# Patient Record
Sex: Female | Born: 1945 | Race: White | Hispanic: No | Smoking: Never smoker
Health system: Southern US, Community
[De-identification: ages and names within clinical notes are randomized; demographics above are authoritative.]

## PROBLEM LIST (undated history)

## (undated) DIAGNOSIS — E079 Disorder of thyroid, unspecified: Secondary | ICD-10-CM

## (undated) DIAGNOSIS — E119 Type 2 diabetes mellitus without complications: Secondary | ICD-10-CM

## (undated) DIAGNOSIS — K219 Gastro-esophageal reflux disease without esophagitis: Secondary | ICD-10-CM

## (undated) DIAGNOSIS — I209 Angina pectoris, unspecified: Secondary | ICD-10-CM

## (undated) DIAGNOSIS — E785 Hyperlipidemia, unspecified: Secondary | ICD-10-CM

## (undated) DIAGNOSIS — I10 Essential (primary) hypertension: Secondary | ICD-10-CM

## (undated) HISTORY — DX: Angina pectoris, unspecified: I20.9

## (undated) HISTORY — PX: CARDIAC CATHETERIZATION: SHX172

## (undated) HISTORY — DX: Hyperlipidemia, unspecified: E78.5

## (undated) HISTORY — DX: Type 2 diabetes mellitus without complications: E11.9

## (undated) HISTORY — DX: Essential (primary) hypertension: I10

## (undated) HISTORY — DX: Gastro-esophageal reflux disease without esophagitis: K21.9

## (undated) HISTORY — DX: Disorder of thyroid, unspecified: E07.9

---

## 2013-09-06 LAB — HM COLONOSCOPY

## 2015-12-31 DIAGNOSIS — E039 Hypothyroidism, unspecified: Secondary | ICD-10-CM

## 2015-12-31 HISTORY — DX: Hypothyroidism, unspecified: E03.9

## 2016-09-04 DIAGNOSIS — K219 Gastro-esophageal reflux disease without esophagitis: Secondary | ICD-10-CM

## 2016-09-04 DIAGNOSIS — I160 Hypertensive urgency: Secondary | ICD-10-CM

## 2016-09-04 DIAGNOSIS — R079 Chest pain, unspecified: Secondary | ICD-10-CM | POA: Diagnosis not present

## 2016-09-04 DIAGNOSIS — R739 Hyperglycemia, unspecified: Secondary | ICD-10-CM

## 2016-09-04 DIAGNOSIS — E039 Hypothyroidism, unspecified: Secondary | ICD-10-CM

## 2016-09-04 DIAGNOSIS — E7800 Pure hypercholesterolemia, unspecified: Secondary | ICD-10-CM

## 2016-09-04 DIAGNOSIS — E860 Dehydration: Secondary | ICD-10-CM

## 2016-09-05 DIAGNOSIS — I160 Hypertensive urgency: Secondary | ICD-10-CM | POA: Diagnosis not present

## 2016-09-05 DIAGNOSIS — E860 Dehydration: Secondary | ICD-10-CM | POA: Diagnosis not present

## 2016-09-05 DIAGNOSIS — R739 Hyperglycemia, unspecified: Secondary | ICD-10-CM | POA: Diagnosis not present

## 2016-09-05 DIAGNOSIS — R079 Chest pain, unspecified: Secondary | ICD-10-CM | POA: Diagnosis not present

## 2016-09-06 DIAGNOSIS — I160 Hypertensive urgency: Secondary | ICD-10-CM | POA: Diagnosis not present

## 2016-09-06 DIAGNOSIS — E860 Dehydration: Secondary | ICD-10-CM | POA: Diagnosis not present

## 2016-09-06 DIAGNOSIS — R079 Chest pain, unspecified: Secondary | ICD-10-CM | POA: Diagnosis not present

## 2016-09-06 DIAGNOSIS — R739 Hyperglycemia, unspecified: Secondary | ICD-10-CM | POA: Diagnosis not present

## 2016-09-07 DIAGNOSIS — I160 Hypertensive urgency: Secondary | ICD-10-CM | POA: Diagnosis not present

## 2016-09-07 DIAGNOSIS — E860 Dehydration: Secondary | ICD-10-CM | POA: Diagnosis not present

## 2016-09-07 DIAGNOSIS — R079 Chest pain, unspecified: Secondary | ICD-10-CM | POA: Diagnosis not present

## 2016-09-07 DIAGNOSIS — I214 Non-ST elevation (NSTEMI) myocardial infarction: Secondary | ICD-10-CM

## 2016-09-07 DIAGNOSIS — R739 Hyperglycemia, unspecified: Secondary | ICD-10-CM | POA: Diagnosis not present

## 2016-09-07 HISTORY — DX: Non-ST elevation (NSTEMI) myocardial infarction: I21.4

## 2016-10-07 DIAGNOSIS — E119 Type 2 diabetes mellitus without complications: Secondary | ICD-10-CM

## 2016-10-07 DIAGNOSIS — I2510 Atherosclerotic heart disease of native coronary artery without angina pectoris: Secondary | ICD-10-CM | POA: Insufficient documentation

## 2016-10-07 DIAGNOSIS — E785 Hyperlipidemia, unspecified: Secondary | ICD-10-CM

## 2016-10-07 DIAGNOSIS — I10 Essential (primary) hypertension: Secondary | ICD-10-CM | POA: Insufficient documentation

## 2016-10-07 DIAGNOSIS — Z794 Long term (current) use of insulin: Secondary | ICD-10-CM

## 2016-10-07 HISTORY — DX: Hyperlipidemia, unspecified: E78.5

## 2016-10-07 HISTORY — DX: Atherosclerotic heart disease of native coronary artery without angina pectoris: I25.10

## 2016-10-07 HISTORY — DX: Type 2 diabetes mellitus without complications: E11.9

## 2016-10-07 HISTORY — DX: Long term (current) use of insulin: Z79.4

## 2016-10-07 HISTORY — DX: Essential (primary) hypertension: I10

## 2017-03-08 DIAGNOSIS — E559 Vitamin D deficiency, unspecified: Secondary | ICD-10-CM

## 2017-03-08 DIAGNOSIS — E039 Hypothyroidism, unspecified: Secondary | ICD-10-CM | POA: Diagnosis not present

## 2017-03-08 DIAGNOSIS — M810 Age-related osteoporosis without current pathological fracture: Secondary | ICD-10-CM | POA: Diagnosis not present

## 2017-03-08 HISTORY — DX: Vitamin D deficiency, unspecified: E55.9

## 2017-03-10 DIAGNOSIS — R413 Other amnesia: Secondary | ICD-10-CM | POA: Insufficient documentation

## 2017-03-10 DIAGNOSIS — F0390 Unspecified dementia without behavioral disturbance: Secondary | ICD-10-CM

## 2017-03-10 DIAGNOSIS — F0280 Dementia in other diseases classified elsewhere without behavioral disturbance: Secondary | ICD-10-CM | POA: Insufficient documentation

## 2017-03-10 DIAGNOSIS — G309 Alzheimer's disease, unspecified: Secondary | ICD-10-CM

## 2017-03-10 DIAGNOSIS — F0281 Dementia in other diseases classified elsewhere with behavioral disturbance: Secondary | ICD-10-CM | POA: Diagnosis not present

## 2017-03-10 DIAGNOSIS — F0150 Vascular dementia without behavioral disturbance: Secondary | ICD-10-CM | POA: Diagnosis not present

## 2017-03-10 DIAGNOSIS — G301 Alzheimer's disease with late onset: Secondary | ICD-10-CM | POA: Diagnosis not present

## 2017-03-10 HISTORY — DX: Unspecified dementia, unspecified severity, without behavioral disturbance, psychotic disturbance, mood disturbance, and anxiety: F03.90

## 2017-03-10 HISTORY — DX: Other amnesia: R41.3

## 2017-03-11 DIAGNOSIS — F0281 Dementia in other diseases classified elsewhere with behavioral disturbance: Secondary | ICD-10-CM | POA: Diagnosis not present

## 2017-03-11 DIAGNOSIS — R413 Other amnesia: Secondary | ICD-10-CM | POA: Diagnosis not present

## 2017-03-11 DIAGNOSIS — G301 Alzheimer's disease with late onset: Secondary | ICD-10-CM | POA: Diagnosis not present

## 2017-03-11 DIAGNOSIS — F0150 Vascular dementia without behavioral disturbance: Secondary | ICD-10-CM | POA: Diagnosis not present

## 2017-03-14 DIAGNOSIS — E119 Type 2 diabetes mellitus without complications: Secondary | ICD-10-CM | POA: Diagnosis not present

## 2017-03-14 DIAGNOSIS — R0902 Hypoxemia: Secondary | ICD-10-CM | POA: Diagnosis not present

## 2017-03-14 DIAGNOSIS — E1165 Type 2 diabetes mellitus with hyperglycemia: Secondary | ICD-10-CM | POA: Diagnosis not present

## 2017-03-14 DIAGNOSIS — I229 Subsequent ST elevation (STEMI) myocardial infarction of unspecified site: Secondary | ICD-10-CM | POA: Diagnosis not present

## 2017-03-14 DIAGNOSIS — I25118 Atherosclerotic heart disease of native coronary artery with other forms of angina pectoris: Secondary | ICD-10-CM | POA: Diagnosis not present

## 2017-03-14 DIAGNOSIS — E039 Hypothyroidism, unspecified: Secondary | ICD-10-CM | POA: Diagnosis not present

## 2017-03-14 DIAGNOSIS — I10 Essential (primary) hypertension: Secondary | ICD-10-CM | POA: Diagnosis not present

## 2017-03-14 DIAGNOSIS — Z8249 Family history of ischemic heart disease and other diseases of the circulatory system: Secondary | ICD-10-CM | POA: Diagnosis not present

## 2017-03-14 DIAGNOSIS — I447 Left bundle-branch block, unspecified: Secondary | ICD-10-CM | POA: Diagnosis not present

## 2017-03-14 DIAGNOSIS — I252 Old myocardial infarction: Secondary | ICD-10-CM | POA: Diagnosis not present

## 2017-03-14 DIAGNOSIS — E860 Dehydration: Secondary | ICD-10-CM | POA: Diagnosis not present

## 2017-03-14 DIAGNOSIS — I5033 Acute on chronic diastolic (congestive) heart failure: Secondary | ICD-10-CM | POA: Diagnosis not present

## 2017-03-14 DIAGNOSIS — T7601XA Adult neglect or abandonment, suspected, initial encounter: Secondary | ICD-10-CM | POA: Diagnosis not present

## 2017-03-14 DIAGNOSIS — J181 Lobar pneumonia, unspecified organism: Secondary | ICD-10-CM | POA: Diagnosis not present

## 2017-03-14 DIAGNOSIS — J189 Pneumonia, unspecified organism: Secondary | ICD-10-CM | POA: Diagnosis not present

## 2017-03-14 DIAGNOSIS — E7800 Pure hypercholesterolemia, unspecified: Secondary | ICD-10-CM | POA: Diagnosis not present

## 2017-03-14 DIAGNOSIS — I214 Non-ST elevation (NSTEMI) myocardial infarction: Secondary | ICD-10-CM | POA: Diagnosis not present

## 2017-03-14 DIAGNOSIS — R079 Chest pain, unspecified: Secondary | ICD-10-CM | POA: Diagnosis not present

## 2017-03-17 DIAGNOSIS — D72829 Elevated white blood cell count, unspecified: Secondary | ICD-10-CM | POA: Diagnosis not present

## 2017-03-17 DIAGNOSIS — I119 Hypertensive heart disease without heart failure: Secondary | ICD-10-CM | POA: Diagnosis not present

## 2017-03-17 DIAGNOSIS — E119 Type 2 diabetes mellitus without complications: Secondary | ICD-10-CM | POA: Diagnosis not present

## 2017-03-17 DIAGNOSIS — Z955 Presence of coronary angioplasty implant and graft: Secondary | ICD-10-CM | POA: Diagnosis not present

## 2017-03-17 DIAGNOSIS — E039 Hypothyroidism, unspecified: Secondary | ICD-10-CM | POA: Diagnosis not present

## 2017-03-17 DIAGNOSIS — E785 Hyperlipidemia, unspecified: Secondary | ICD-10-CM | POA: Diagnosis not present

## 2017-03-17 DIAGNOSIS — I214 Non-ST elevation (NSTEMI) myocardial infarction: Secondary | ICD-10-CM | POA: Diagnosis not present

## 2017-03-17 DIAGNOSIS — Z880 Allergy status to penicillin: Secondary | ICD-10-CM | POA: Diagnosis not present

## 2017-03-17 DIAGNOSIS — I25118 Atherosclerotic heart disease of native coronary artery with other forms of angina pectoris: Secondary | ICD-10-CM | POA: Diagnosis not present

## 2017-03-17 DIAGNOSIS — E784 Other hyperlipidemia: Secondary | ICD-10-CM | POA: Diagnosis not present

## 2017-03-17 DIAGNOSIS — F0281 Dementia in other diseases classified elsewhere with behavioral disturbance: Secondary | ICD-10-CM | POA: Diagnosis not present

## 2017-03-17 DIAGNOSIS — R9431 Abnormal electrocardiogram [ECG] [EKG]: Secondary | ICD-10-CM | POA: Diagnosis not present

## 2017-03-17 DIAGNOSIS — Z794 Long term (current) use of insulin: Secondary | ICD-10-CM | POA: Diagnosis not present

## 2017-03-17 DIAGNOSIS — G301 Alzheimer's disease with late onset: Secondary | ICD-10-CM | POA: Diagnosis not present

## 2017-03-17 DIAGNOSIS — F0390 Unspecified dementia without behavioral disturbance: Secondary | ICD-10-CM | POA: Diagnosis not present

## 2017-03-17 DIAGNOSIS — Z8249 Family history of ischemic heart disease and other diseases of the circulatory system: Secondary | ICD-10-CM | POA: Diagnosis not present

## 2017-03-17 DIAGNOSIS — Z882 Allergy status to sulfonamides status: Secondary | ICD-10-CM | POA: Diagnosis not present

## 2017-03-17 DIAGNOSIS — I252 Old myocardial infarction: Secondary | ICD-10-CM | POA: Diagnosis not present

## 2017-03-17 DIAGNOSIS — I2510 Atherosclerotic heart disease of native coronary artery without angina pectoris: Secondary | ICD-10-CM | POA: Diagnosis not present

## 2017-06-02 ENCOUNTER — Encounter: Payer: Self-pay | Admitting: Cardiology

## 2017-06-02 ENCOUNTER — Ambulatory Visit (INDEPENDENT_AMBULATORY_CARE_PROVIDER_SITE_OTHER): Payer: Medicare HMO | Admitting: Cardiology

## 2017-06-02 VITALS — BP 140/60 | HR 76 | Resp 10 | Ht 61.0 in | Wt 119.4 lb

## 2017-06-02 DIAGNOSIS — I10 Essential (primary) hypertension: Secondary | ICD-10-CM

## 2017-06-02 DIAGNOSIS — I2510 Atherosclerotic heart disease of native coronary artery without angina pectoris: Secondary | ICD-10-CM

## 2017-06-02 DIAGNOSIS — E119 Type 2 diabetes mellitus without complications: Secondary | ICD-10-CM | POA: Diagnosis not present

## 2017-06-02 DIAGNOSIS — E785 Hyperlipidemia, unspecified: Secondary | ICD-10-CM | POA: Diagnosis not present

## 2017-06-02 NOTE — Progress Notes (Signed)
Cardiology Office Note:    Date:  06/02/2017   ID:  Nancy Lang, [place] 02/02/1946, MRN [id number]  PCP:  Rochel Brome, MD  Cardiologist:  Jenne Campus, MD    Referring MD: Rochel Brome, MD   Chief Complaint  Patient presents with  . Follow-up  Postoperative to follow-up  History of Present Illness:    Nancy Lang is a 71 y.o. female  with coronary artery disease. In May she started having some chest pain she undergoing to [facility], she was fine to have non-STEMI with the highest troponin I of 8 PTCA and drug-eluting stents to meet LAD was implanted. Since that time she is doing well. She is hard of hearing therefore conversation with her somewhat difficult. However, her daughter is here with her and we have regular conversation with her. Patient denies having any chest pain tightness squeezing pressure burning chest, she is very active. Actually her daughter thinks she is doing too much.  Past Medical History:  Diagnosis Date  . Angina pectoris ([place])   . Diabetes mellitus without complication ([place])   . GERD (gastroesophageal reflux disease)   . Hyperlipidemia   . Hypertension   . Thyroid disease     Past Surgical History:  Procedure Laterality Date  . CARDIAC CATHETERIZATION      Current Medications: No outpatient prescriptions have been marked as taking for the 06/02/17 encounter (Office Visit) with Park Liter, MD.     Allergies:   Fenofibrate; Oxytetracycline; Penicillins; Sulfa antibiotics; and Tetracyclines & related   Social History   Social History  . Marital status: Widowed    Spouse name: N/A  . Number of children: N/A  . Years of education: N/A   Social History Main Topics  . Smoking status: Never Smoker  . Smokeless tobacco: Current User    Types: Snuff  . Alcohol use No  . Drug use: No  . Sexual activity: Not Asked   Other Topics Concern  . None   Social History Narrative  . None      Family History: The patient's family history includes Alcohol abuse in her father; Cancer in her mother; Diabetes in her mother; Heart disease in her father and mother. ROS:   Please see the history of present illness.    All 14 point review of systems negative except as described per history of present illness  EKGs/Labs/Other Studies Reviewed:      Recent Labs: No results found for requested labs within last 8760 hours.  Recent Lipid Panel No results found for: CHOL, TRIG, HDL, CHOLHDL, VLDL, LDLCALC, LDLDIRECT  Physical Exam:    VS:  BP 140/60   Pulse 76   Resp 10   Ht 5\' 1"  (1.549 m)   Wt 119 lb 6.4 oz (54.2 kg)   BMI 22.56 kg/m     Wt Readings from Last 3 Encounters:  06/02/17 119 lb 6.4 oz (54.2 kg)     GEN:  Well nourished, well developed in no acute distress HEENT: Normal NECK: No JVD; No carotid bruits LYMPHATICS: No lymphadenopathy CARDIAC: RRR, no murmurs, no rubs, no gallops RESPIRATORY:  Clear to auscultation without rales, wheezing or rhonchi  ABDOMEN: Soft, non-tender, non-distended MUSCULOSKELETAL:  No edema; No deformity  SKIN: Warm and dry LOWER EXTREMITIES: no swelling NEUROLOGIC:  Alert and oriented x 3 PSYCHIATRIC:  Normal affect   ASSESSMENT:    1. Coronary artery disease involving native coronary artery of native heart without angina  pectoris   2. Essential hypertension   3. Dyslipidemia (high LDL; low HDL)    PLAN:    In order of problems listed above:  1. Coronary artery disease: Doing well from that point of view I review quite catheterization with her daughter as well as with the patient, we talked about what was done. I stressed importance of taking all medications especially dual antiplatelet agents. In my opinion after a year of Brelinta to will switch her to Plavix. 2. Dyslipidemia: I will ask him to have fasting lipid profile done today. 3. Diabetes mellitus: Apparently she was very point taking her medications now she is much  better. I will check her hemoglobin A1c today.  Overall she is doing well. I'll see him back in about 3 months. I review hospital record for this appointment.   Medication Adjustments/Labs and Tests Ordered: Current medicines are reviewed at length with the patient today.  Concerns regarding medicines are outlined above.  Orders Placed This Encounter  Procedures  . HgB A1c  . Lipid panel  . EKG 12-Lead   Medication changes: No orders of the defined types were placed in this encounter.   Signed, Park Liter, MD, [facility] 06/02/2017 1:21 PM    [facility]

## 2017-06-02 NOTE — Patient Instructions (Addendum)
Medication Instructions:  Your physician recommends that you continue on your current medications as directed. Please refer to the Current Medication list given to you today.  Labwork: Your physician recommends that you have your cholesterol, A1C checked today. Testing/Procedures: EKG in office today.   Follow-Up: Your physician recommends that you schedule a follow-up appointment in: 3 months   Any Other Special Instructions Will Be Listed Below (If Applicable).  Please note that any paperwork needing to be filled out by the provider will need to be addressed at the front desk prior to seeing the provider. Please note that any paperwork FMLA, Disability or other documents regarding health condition is subject to a $25.00 charge that must be received prior to completion of paperwork.     If you need a refill on your cardiac medications before your next appointment, please call your pharmacy.

## 2017-06-03 ENCOUNTER — Telehealth: Payer: Self-pay

## 2017-06-03 LAB — LIPID PANEL
CHOL/HDL RATIO: 2.5 ratio (ref 0.0–4.4)
CHOLESTEROL TOTAL: 113 mg/dL (ref 100–199)
HDL: 46 mg/dL (ref >39)
LDL Calculated: 36 mg/dL (ref 0–99)
TRIGLYCERIDES: 154 mg/dL — AB (ref 0–149)
VLDL Cholesterol Cal: 31 mg/dL (ref 5–40)

## 2017-06-03 LAB — HEMOGLOBIN A1C
Est. average glucose Bld gHb Est-mCnc: 171 mg/dL
HEMOGLOBIN A1C: 7.6 % — AB (ref 4.8–5.6)

## 2017-06-03 MED ORDER — METOPROLOL TARTRATE 50 MG PO TABS: 50.0000 mg | ORAL_TABLET | Freq: Two times a day (BID) | ORAL | 2 refills | Status: DC

## 2017-06-03 NOTE — Telephone Encounter (Signed)
Refill Request faxed from pharmacy for Metoprolol. Refills provided.

## 2017-06-22 DIAGNOSIS — H31011 Macula scars of posterior pole (postinflammatory) (post-traumatic), right eye: Secondary | ICD-10-CM | POA: Diagnosis not present

## 2017-06-22 DIAGNOSIS — Z0100 Encounter for examination of eyes and vision without abnormal findings: Secondary | ICD-10-CM | POA: Diagnosis not present

## 2017-06-22 DIAGNOSIS — E11319 Type 2 diabetes mellitus with unspecified diabetic retinopathy without macular edema: Secondary | ICD-10-CM | POA: Diagnosis not present

## 2017-06-22 DIAGNOSIS — H52223 Regular astigmatism, bilateral: Secondary | ICD-10-CM | POA: Diagnosis not present

## 2017-08-09 ENCOUNTER — Other Ambulatory Visit: Payer: Self-pay

## 2017-08-09 MED ORDER — TICAGRELOR 90 MG PO TABS: 90.0000 mg | ORAL_TABLET | Freq: Two times a day (BID) | ORAL | 6 refills | Status: DC

## 2017-08-10 ENCOUNTER — Other Ambulatory Visit: Payer: Self-pay

## 2017-08-10 MED ORDER — ATORVASTATIN CALCIUM 80 MG PO TABS: 80.0000 mg | ORAL_TABLET | Freq: Every day | ORAL | 6 refills | Status: DC

## 2017-09-02 ENCOUNTER — Encounter: Payer: Self-pay | Admitting: Cardiology

## 2017-09-02 ENCOUNTER — Ambulatory Visit: Payer: Medicare HMO | Admitting: Cardiology

## 2017-09-02 VITALS — BP 170/68 | HR 75 | Ht 62.0 in | Wt 123.1 lb

## 2017-09-02 DIAGNOSIS — E785 Hyperlipidemia, unspecified: Secondary | ICD-10-CM | POA: Diagnosis not present

## 2017-09-02 DIAGNOSIS — I2510 Atherosclerotic heart disease of native coronary artery without angina pectoris: Secondary | ICD-10-CM

## 2017-09-02 DIAGNOSIS — I10 Essential (primary) hypertension: Secondary | ICD-10-CM | POA: Diagnosis not present

## 2017-09-02 NOTE — Patient Instructions (Signed)
Medication Instructions:  Your physician recommends that you continue on your current medications as directed. Please refer to the Current Medication list given to you today.  Labwork: None   Testing/Procedures: None   Follow-Up: Your physician recommends that you schedule a follow-up appointment in: 3 months   Any Other Special Instructions Will Be Listed Below (If Applicable).  Please note that any paperwork needing to be filled out by the provider will need to be addressed at the front desk prior to seeing the provider. Please note that any paperwork FMLA, Disability or other documents regarding health condition is subject to a $25.00 charge that must be received prior to completion of paperwork in the form of a money order or check.     If you need a refill on your cardiac medications before your next appointment, please call your pharmacy.  

## 2017-09-02 NOTE — Progress Notes (Signed)
Cardiology Office Note:    Date:  09/02/2017   ID:  Nancy Lang, [place] 11/10/1945, MRN [id number]  PCP:  Nancy Brome, MD  Cardiologist:  Nancy Campus, MD    Referring MD: Nancy Brome, MD   Chief Complaint  Patient presents with  . Follow-up    Doing well Since last OV   Doing well  History of Present Illness:    Nancy Lang is a 71 y.o. female asymptomatic no chest pain tightness squeezing pressure burning chest.  Very active have no difficulty doing it.  Past Medical History:  Diagnosis Date  . Angina pectoris ([place])   . Diabetes mellitus without complication ([place])   . GERD (gastroesophageal reflux disease)   . Hyperlipidemia   . Hypertension   . Thyroid disease     Past Surgical History:  Procedure Laterality Date  . CARDIAC CATHETERIZATION      Current Medications: No outpatient medications have been marked as taking for the 09/02/17 encounter (Office Visit) with Park Liter, MD.     Allergies:   Fenofibrate; Oxytetracycline; Penicillins; Sulfa antibiotics; and Tetracyclines & related   Social History   Socioeconomic History  . Marital status: Widowed    Spouse name: None  . Number of children: None  . Years of education: None  . Highest education level: None  Social Needs  . Financial resource strain: None  . Food insecurity - worry: None  . Food insecurity - inability: None  . Transportation needs - medical: None  . Transportation needs - non-medical: None  Occupational History  . None  Tobacco Use  . Smoking status: Never Smoker  . Smokeless tobacco: Current User    Types: Snuff  Substance and Sexual Activity  . Alcohol use: No  . Drug use: No  . Sexual activity: None  Other Topics Concern  . None  Social History Narrative  . None     Family History: The patient's family history includes Alcohol abuse in her father; Cancer in her mother; Diabetes in her mother; Heart disease in her father and  mother. ROS:   Please see the history of present illness.    All 14 point review of systems negative except as described per history of present illness  EKGs/Labs/Other Studies Reviewed:      Recent Labs: No results found for requested labs within last 8760 hours.  Recent Lipid Panel    Component Value Date/Time   CHOL 113 06/02/2017 1214   TRIG 154 (H) 06/02/2017 1214   HDL 46 06/02/2017 1214   CHOLHDL 2.5 06/02/2017 1214   LDLCALC 36 06/02/2017 1214    Physical Exam:    VS:  BP (!) 170/68   Pulse 75   Ht 5\' 2"  (1.575 m)   Wt 123 lb 1.3 oz (55.8 kg)   SpO2 98%   BMI 22.51 kg/m     Wt Readings from Last 3 Encounters:  09/02/17 123 lb 1.3 oz (55.8 kg)  06/02/17 119 lb 6.4 oz (54.2 kg)     GEN:  Well nourished, well developed in no acute distress HEENT: Normal NECK: No JVD; No carotid bruits LYMPHATICS: No lymphadenopathy CARDIAC: RRR, no murmurs, no rubs, no gallops RESPIRATORY:  Clear to auscultation without rales, wheezing or rhonchi  ABDOMEN: Soft, non-tender, non-distended MUSCULOSKELETAL:  No edema; No deformity  SKIN: Warm and dry LOWER EXTREMITIES: no swelling NEUROLOGIC:  Alert and oriented x 3 PSYCHIATRIC:  Normal affect   ASSESSMENT:    1.  Essential hypertension   2. Dyslipidemia (high LDL; low HDL)   3. Coronary artery disease involving native coronary artery of native heart without angina pectoris    PLAN:    In order of problems listed above:  1. Benign hypertension: Blood pressure elevated in the office but at home its perfect we will continue present management. 2. Dyslipidemia: I will check her fasting lipid profile she is scheduled to see her primary care physician next week and I asked him to do the test 3. Coronary artery disease: Stable asymptomatic and appropriate dual antiplatelets agent.   Medication Adjustments/Labs and Tests Ordered: Current medicines are reviewed at length with the patient today.  Concerns regarding medicines  are outlined above.  No orders of the defined types were placed in this encounter.  Medication changes: No orders of the defined types were placed in this encounter.   Signed, Park Liter, MD, [facility] 09/02/2017 11:14 AM    [place]

## 2017-09-08 DIAGNOSIS — I2510 Atherosclerotic heart disease of native coronary artery without angina pectoris: Secondary | ICD-10-CM | POA: Diagnosis not present

## 2017-09-08 DIAGNOSIS — R4181 Age-related cognitive decline: Secondary | ICD-10-CM | POA: Diagnosis not present

## 2017-09-08 DIAGNOSIS — E782 Mixed hyperlipidemia: Secondary | ICD-10-CM | POA: Diagnosis not present

## 2017-09-08 DIAGNOSIS — F411 Generalized anxiety disorder: Secondary | ICD-10-CM | POA: Diagnosis not present

## 2017-09-08 DIAGNOSIS — E038 Other specified hypothyroidism: Secondary | ICD-10-CM | POA: Diagnosis not present

## 2017-09-08 DIAGNOSIS — N3001 Acute cystitis with hematuria: Secondary | ICD-10-CM | POA: Diagnosis not present

## 2017-09-08 DIAGNOSIS — I10 Essential (primary) hypertension: Secondary | ICD-10-CM | POA: Diagnosis not present

## 2017-09-08 DIAGNOSIS — E559 Vitamin D deficiency, unspecified: Secondary | ICD-10-CM | POA: Diagnosis not present

## 2017-09-08 DIAGNOSIS — K219 Gastro-esophageal reflux disease without esophagitis: Secondary | ICD-10-CM | POA: Diagnosis not present

## 2017-09-08 DIAGNOSIS — E119 Type 2 diabetes mellitus without complications: Secondary | ICD-10-CM | POA: Diagnosis not present

## 2017-10-28 DIAGNOSIS — N3000 Acute cystitis without hematuria: Secondary | ICD-10-CM | POA: Diagnosis not present

## 2017-11-12 DIAGNOSIS — R799 Abnormal finding of blood chemistry, unspecified: Secondary | ICD-10-CM | POA: Diagnosis not present

## 2017-11-30 DIAGNOSIS — R300 Dysuria: Secondary | ICD-10-CM | POA: Diagnosis not present

## 2017-12-01 DIAGNOSIS — N3000 Acute cystitis without hematuria: Secondary | ICD-10-CM | POA: Diagnosis not present

## 2017-12-08 DIAGNOSIS — M810 Age-related osteoporosis without current pathological fracture: Secondary | ICD-10-CM | POA: Diagnosis not present

## 2017-12-13 DIAGNOSIS — N3000 Acute cystitis without hematuria: Secondary | ICD-10-CM | POA: Diagnosis not present

## 2017-12-13 DIAGNOSIS — E782 Mixed hyperlipidemia: Secondary | ICD-10-CM | POA: Diagnosis not present

## 2017-12-13 DIAGNOSIS — E119 Type 2 diabetes mellitus without complications: Secondary | ICD-10-CM | POA: Diagnosis not present

## 2017-12-13 DIAGNOSIS — R4181 Age-related cognitive decline: Secondary | ICD-10-CM | POA: Diagnosis not present

## 2017-12-13 DIAGNOSIS — E038 Other specified hypothyroidism: Secondary | ICD-10-CM | POA: Diagnosis not present

## 2017-12-13 DIAGNOSIS — N3001 Acute cystitis with hematuria: Secondary | ICD-10-CM | POA: Diagnosis not present

## 2017-12-13 DIAGNOSIS — F411 Generalized anxiety disorder: Secondary | ICD-10-CM | POA: Diagnosis not present

## 2017-12-13 DIAGNOSIS — I2510 Atherosclerotic heart disease of native coronary artery without angina pectoris: Secondary | ICD-10-CM | POA: Diagnosis not present

## 2017-12-13 DIAGNOSIS — K219 Gastro-esophageal reflux disease without esophagitis: Secondary | ICD-10-CM | POA: Diagnosis not present

## 2017-12-13 DIAGNOSIS — I10 Essential (primary) hypertension: Secondary | ICD-10-CM | POA: Diagnosis not present

## 2017-12-27 DIAGNOSIS — N3000 Acute cystitis without hematuria: Secondary | ICD-10-CM | POA: Diagnosis not present

## 2018-01-10 DIAGNOSIS — J060 Acute laryngopharyngitis: Secondary | ICD-10-CM | POA: Diagnosis not present

## 2018-01-12 ENCOUNTER — Ambulatory Visit: Payer: Medicare HMO | Admitting: Cardiology

## 2018-02-16 ENCOUNTER — Encounter: Payer: Self-pay | Admitting: Cardiology

## 2018-02-16 ENCOUNTER — Ambulatory Visit: Payer: Medicare HMO | Admitting: Cardiology

## 2018-02-16 VITALS — BP 124/68 | HR 73 | Ht 62.0 in | Wt 120.8 lb

## 2018-02-16 DIAGNOSIS — E785 Hyperlipidemia, unspecified: Secondary | ICD-10-CM | POA: Diagnosis not present

## 2018-02-16 DIAGNOSIS — I2510 Atherosclerotic heart disease of native coronary artery without angina pectoris: Secondary | ICD-10-CM

## 2018-02-16 DIAGNOSIS — I10 Essential (primary) hypertension: Secondary | ICD-10-CM | POA: Diagnosis not present

## 2018-02-16 NOTE — Progress Notes (Signed)
Cardiology Office Note:    Date:  02/16/2018   ID:  Nancy Lang, [place] 08-07-1946, MRN [id number]  PCP:  Rochel Brome, MD  Cardiologist:  Jenne Campus, MD    Referring MD: Rochel Brome, MD   Chief Complaint  Patient presents with  . Follow-up  Doing well  History of Present Illness:    Nancy Lang is a 72 y.o. female with coronary artery disease status post PTCA and drug-eluting stent to mid LAD in May 2018.  Also prior history of stenting to circumflex artery.  Overall she is doing well asymptomatic no chest pain tightness squeezing pressure burning chest she is very active in the garden and have no difficulty doing it.  Past Medical History:  Diagnosis Date  . Angina pectoris ([place])   . Diabetes mellitus without complication ([place])   . GERD (gastroesophageal reflux disease)   . Hyperlipidemia   . Hypertension   . Thyroid disease     Past Surgical History:  Procedure Laterality Date  . CARDIAC CATHETERIZATION      Current Medications: Current Meds  Medication Sig  . amLODipine (NORVASC) 2.5 MG tablet Take 2.5 mg by mouth daily.  Marland Kitchen aspirin 81 MG chewable tablet Chew 1 tablet by mouth daily.  Marland Kitchen atorvastatin (LIPITOR) 80 MG tablet Take 1 tablet (80 mg total) by mouth daily.  . Cholecalciferol (VITAMIN D3) 1000 units CAPS Take 1 capsule by mouth daily.  . DOCOSAHEXAENOIC ACID PO Take 1 g by mouth 2 (two) times daily.  Marland Kitchen donepezil (ARICEPT) 5 MG tablet Take 5 mg by mouth daily.  . Insulin Glargine (LANTUS SOLOSTAR) 100 UNIT/ML Solostar Pen Inject 40 Units into the skin at bedtime.  Marland Kitchen levothyroxine (SYNTHROID, LEVOTHROID) 50 MCG tablet Take 1 tablet by mouth daily.  Marland Kitchen lisinopril (PRINIVIL,ZESTRIL) 20 MG tablet Take 20 mg by mouth daily.  Marland Kitchen LORazepam (ATIVAN) 0.5 MG tablet Take 1 tablet by mouth at bedtime as needed.  . memantine (NAMENDA) 10 MG tablet Take 10 mg by mouth 2 (two) times daily.  . metFORMIN (GLUCOPHAGE) 1000 MG tablet Take 1,000  mg by mouth daily.  . metoprolol tartrate (LOPRESSOR) 50 MG tablet Take 1 tablet (50 mg total) by mouth 2 (two) times daily.  . montelukast (SINGULAIR) 10 MG tablet Take 10 mg by mouth daily.  . Multiple Vitamin (MULTI-VITAMINS) TABS Take 1 tablet by mouth daily.  Marland Kitchen omeprazole (PRILOSEC) 20 MG capsule Take 20 mg by mouth daily.  . Potassium 99 MG TABS Take 1 tablet by mouth daily.  . ticagrelor (BRILINTA) 90 MG TABS tablet Take 1 tablet (90 mg total) by mouth 2 (two) times daily.  . vitamin B-12 (CYANOCOBALAMIN) 1000 MCG tablet Take 1 tablet by mouth daily.  . vitamin E 1000 UNIT capsule Take 1 capsule by mouth daily.  . Zoledronic Acid (RECLAST IV) Inject 5 mg into the vein. yearly     Allergies:   Fenofibrate; Oxytetracycline; Penicillins; Sulfa antibiotics; and Tetracyclines & related   Social History   Socioeconomic History  . Marital status: Widowed    Spouse name: Not on file  . Number of children: Not on file  . Years of education: Not on file  . Highest education level: Not on file  Occupational History  . Not on file  Social Needs  . Financial resource strain: Not on file  . Food insecurity:    Worry: Not on file    Inability: Not on file  . Transportation needs:  Medical: Not on file    Non-medical: Not on file  Tobacco Use  . Smoking status: Never Smoker  . Smokeless tobacco: Current User    Types: Snuff  Substance and Sexual Activity  . Alcohol use: No  . Drug use: No  . Sexual activity: Not on file  Lifestyle  . Physical activity:    Days per week: Not on file    Minutes per session: Not on file  . Stress: Not on file  Relationships  . Social connections:    Talks on phone: Not on file    Gets together: Not on file    Attends religious service: Not on file    Active member of club or organization: Not on file    Attends meetings of clubs or organizations: Not on file    Relationship status: Not on file  Other Topics Concern  . Not on file  Social  History Narrative  . Not on file     Family History: The patient's family history includes Alcohol abuse in her father; Cancer in her mother; Diabetes in her mother; Heart disease in her father and mother. ROS:   Please see the history of present illness.    All 14 point review of systems negative except as described per history of present illness  EKGs/Labs/Other Studies Reviewed:      Recent Labs: No results found for requested labs within last 8760 hours.  Recent Lipid Panel    Component Value Date/Time   CHOL 113 06/02/2017 1214   TRIG 154 (H) 06/02/2017 1214   HDL 46 06/02/2017 1214   CHOLHDL 2.5 06/02/2017 1214   LDLCALC 36 06/02/2017 1214    Physical Exam:    VS:  BP 124/68   Pulse 73   Ht 5\' 2"  (1.575 m)   Wt 120 lb 12.8 oz (54.8 kg)   SpO2 98%   BMI 22.09 kg/m     Wt Readings from Last 3 Encounters:  02/16/18 120 lb 12.8 oz (54.8 kg)  09/02/17 123 lb 1.3 oz (55.8 kg)  06/02/17 119 lb 6.4 oz (54.2 kg)     GEN:  Well nourished, well developed in no acute distress HEENT: Normal NECK: No JVD; No carotid bruits LYMPHATICS: No lymphadenopathy CARDIAC: RRR, no murmurs, no rubs, no gallops RESPIRATORY:  Clear to auscultation without rales, wheezing or rhonchi  ABDOMEN: Soft, non-tender, non-distended MUSCULOSKELETAL:  No edema; No deformity  SKIN: Warm and dry LOWER EXTREMITIES: no swelling NEUROLOGIC:  Alert and oriented x 3 PSYCHIATRIC:  Normal affect   ASSESSMENT:    1. Coronary artery disease involving native coronary artery of native heart without angina pectoris   2. Essential hypertension   3. Dyslipidemia (high LDL; low HDL)    PLAN:    In order of problems listed above:  1. Coronary artery disease status post PTCA and drug-eluting stent to mid LAD with prior stent to circumflex.  She is on dual antiplatelet therapy until May of this year.  Will make sure will adjust her risk factors as well as we can.  I will call primary care physician to  get a copy of her fasting lipid profile.  Her blood pressure appears to be at target.  She is telling me that her diabetes is well controlled.  Except sometimes she make some dietary mistakes for example few days ago it was her birthday she ate too much cake and sugar was high otherwise seems to be doing well 2. Essential hypertension: Blood  pressure well controlled. 3. Dyslipidemia: We will get a copy of her fasting lipid profile from primary care physician   Medication Adjustments/Labs and Tests Ordered: Current medicines are reviewed at length with the patient today.  Concerns regarding medicines are outlined above.  No orders of the defined types were placed in this encounter.  Medication changes: No orders of the defined types were placed in this encounter.   Signed, Park Liter, MD, [facility] 02/16/2018 9:32 AM    [place]

## 2018-02-16 NOTE — Patient Instructions (Signed)
Medication Instructions:  Your physician recommends that you continue on your current medications as directed. Please refer to the Current Medication list given to you today.  Labwork: None  Testing/Procedures: None  Follow-Up: Your physician wants you to follow-up in: 5 months. You will receive a reminder letter in the mail two months in advance. If you don't receive a letter, please call our office to schedule the follow-up appointment.  Any Other Special Instructions Will Be Listed Below (If Applicable).     If you need a refill on your cardiac medications before your next appointment, please call your pharmacy.   

## 2018-03-08 DIAGNOSIS — N3000 Acute cystitis without hematuria: Secondary | ICD-10-CM | POA: Diagnosis not present

## 2018-03-09 ENCOUNTER — Other Ambulatory Visit: Payer: Self-pay | Admitting: *Deleted

## 2018-03-09 MED ORDER — TICAGRELOR 90 MG PO TABS: 90.0000 mg | ORAL_TABLET | Freq: Two times a day (BID) | ORAL | 9 refills | Status: DC

## 2018-03-09 NOTE — Telephone Encounter (Signed)
Refill sent for Brilinta to [facility] in [place].

## 2018-03-14 ENCOUNTER — Other Ambulatory Visit: Payer: Self-pay | Admitting: *Deleted

## 2018-03-14 MED ORDER — METOPROLOL TARTRATE 50 MG PO TABS: 50.0000 mg | ORAL_TABLET | Freq: Two times a day (BID) | ORAL | 3 refills | Status: DC

## 2018-03-14 NOTE — Telephone Encounter (Signed)
Refill sent to [facility] in [place] 

## 2018-04-20 DIAGNOSIS — G301 Alzheimer's disease with late onset: Secondary | ICD-10-CM | POA: Diagnosis not present

## 2018-04-20 DIAGNOSIS — F0280 Dementia in other diseases classified elsewhere without behavioral disturbance: Secondary | ICD-10-CM | POA: Diagnosis not present

## 2018-05-05 DIAGNOSIS — G301 Alzheimer's disease with late onset: Secondary | ICD-10-CM | POA: Diagnosis not present

## 2018-05-05 DIAGNOSIS — I619 Nontraumatic intracerebral hemorrhage, unspecified: Secondary | ICD-10-CM

## 2018-05-05 DIAGNOSIS — F0280 Dementia in other diseases classified elsewhere without behavioral disturbance: Secondary | ICD-10-CM | POA: Diagnosis not present

## 2018-05-05 DIAGNOSIS — Z8679 Personal history of other diseases of the circulatory system: Secondary | ICD-10-CM | POA: Insufficient documentation

## 2018-05-05 HISTORY — DX: Nontraumatic intracerebral hemorrhage, unspecified: I61.9

## 2018-06-09 DIAGNOSIS — I619 Nontraumatic intracerebral hemorrhage, unspecified: Secondary | ICD-10-CM | POA: Diagnosis not present

## 2018-07-01 DIAGNOSIS — G301 Alzheimer's disease with late onset: Secondary | ICD-10-CM | POA: Diagnosis not present

## 2018-07-01 DIAGNOSIS — F0280 Dementia in other diseases classified elsewhere without behavioral disturbance: Secondary | ICD-10-CM | POA: Diagnosis not present

## 2018-07-01 DIAGNOSIS — I619 Nontraumatic intracerebral hemorrhage, unspecified: Secondary | ICD-10-CM | POA: Diagnosis not present

## 2018-07-13 DIAGNOSIS — I619 Nontraumatic intracerebral hemorrhage, unspecified: Secondary | ICD-10-CM | POA: Diagnosis not present

## 2018-07-14 DIAGNOSIS — I669 Occlusion and stenosis of unspecified cerebral artery: Secondary | ICD-10-CM | POA: Insufficient documentation

## 2018-07-14 HISTORY — DX: Occlusion and stenosis of unspecified cerebral artery: I66.9

## 2018-08-09 DIAGNOSIS — N3000 Acute cystitis without hematuria: Secondary | ICD-10-CM | POA: Diagnosis not present

## 2018-08-09 DIAGNOSIS — Z23 Encounter for immunization: Secondary | ICD-10-CM | POA: Diagnosis not present

## 2018-08-26 DIAGNOSIS — G301 Alzheimer's disease with late onset: Secondary | ICD-10-CM | POA: Diagnosis not present

## 2018-08-26 DIAGNOSIS — F0280 Dementia in other diseases classified elsewhere without behavioral disturbance: Secondary | ICD-10-CM | POA: Diagnosis not present

## 2018-09-19 DIAGNOSIS — E1169 Type 2 diabetes mellitus with other specified complication: Secondary | ICD-10-CM | POA: Diagnosis not present

## 2018-09-19 DIAGNOSIS — I2510 Atherosclerotic heart disease of native coronary artery without angina pectoris: Secondary | ICD-10-CM | POA: Diagnosis not present

## 2018-09-19 DIAGNOSIS — R4181 Age-related cognitive decline: Secondary | ICD-10-CM | POA: Diagnosis not present

## 2018-09-19 DIAGNOSIS — E782 Mixed hyperlipidemia: Secondary | ICD-10-CM | POA: Diagnosis not present

## 2018-09-19 DIAGNOSIS — F411 Generalized anxiety disorder: Secondary | ICD-10-CM | POA: Diagnosis not present

## 2018-09-19 DIAGNOSIS — E119 Type 2 diabetes mellitus without complications: Secondary | ICD-10-CM | POA: Diagnosis not present

## 2018-09-19 DIAGNOSIS — K219 Gastro-esophageal reflux disease without esophagitis: Secondary | ICD-10-CM | POA: Diagnosis not present

## 2018-09-19 DIAGNOSIS — I10 Essential (primary) hypertension: Secondary | ICD-10-CM | POA: Diagnosis not present

## 2018-09-19 DIAGNOSIS — E038 Other specified hypothyroidism: Secondary | ICD-10-CM | POA: Diagnosis not present

## 2018-12-01 DIAGNOSIS — J060 Acute laryngopharyngitis: Secondary | ICD-10-CM | POA: Diagnosis not present

## 2018-12-01 DIAGNOSIS — N3000 Acute cystitis without hematuria: Secondary | ICD-10-CM | POA: Diagnosis not present

## 2018-12-06 DIAGNOSIS — R0789 Other chest pain: Secondary | ICD-10-CM | POA: Diagnosis not present

## 2018-12-06 DIAGNOSIS — R071 Chest pain on breathing: Secondary | ICD-10-CM | POA: Diagnosis not present

## 2018-12-06 DIAGNOSIS — M542 Cervicalgia: Secondary | ICD-10-CM | POA: Diagnosis not present

## 2018-12-06 DIAGNOSIS — J9811 Atelectasis: Secondary | ICD-10-CM | POA: Diagnosis not present

## 2018-12-08 DIAGNOSIS — T481X5A Adverse effect of skeletal muscle relaxants [neuromuscular blocking agents], initial encounter: Secondary | ICD-10-CM | POA: Diagnosis not present

## 2018-12-08 DIAGNOSIS — M542 Cervicalgia: Secondary | ICD-10-CM | POA: Diagnosis not present

## 2018-12-08 DIAGNOSIS — L500 Allergic urticaria: Secondary | ICD-10-CM | POA: Diagnosis not present

## 2018-12-08 DIAGNOSIS — T368X5A Adverse effect of other systemic antibiotics, initial encounter: Secondary | ICD-10-CM | POA: Diagnosis not present

## 2018-12-08 DIAGNOSIS — R0602 Shortness of breath: Secondary | ICD-10-CM | POA: Diagnosis not present

## 2018-12-08 DIAGNOSIS — R0789 Other chest pain: Secondary | ICD-10-CM | POA: Diagnosis not present

## 2018-12-09 DIAGNOSIS — R0602 Shortness of breath: Secondary | ICD-10-CM | POA: Diagnosis not present

## 2019-01-03 DIAGNOSIS — E559 Vitamin D deficiency, unspecified: Secondary | ICD-10-CM | POA: Diagnosis not present

## 2019-01-03 DIAGNOSIS — K219 Gastro-esophageal reflux disease without esophagitis: Secondary | ICD-10-CM | POA: Diagnosis not present

## 2019-01-03 DIAGNOSIS — I10 Essential (primary) hypertension: Secondary | ICD-10-CM | POA: Diagnosis not present

## 2019-01-03 DIAGNOSIS — E038 Other specified hypothyroidism: Secondary | ICD-10-CM | POA: Diagnosis not present

## 2019-01-03 DIAGNOSIS — E119 Type 2 diabetes mellitus without complications: Secondary | ICD-10-CM | POA: Diagnosis not present

## 2019-01-03 DIAGNOSIS — F411 Generalized anxiety disorder: Secondary | ICD-10-CM | POA: Diagnosis not present

## 2019-01-03 DIAGNOSIS — Z6823 Body mass index (BMI) 23.0-23.9, adult: Secondary | ICD-10-CM | POA: Diagnosis not present

## 2019-01-03 DIAGNOSIS — I2510 Atherosclerotic heart disease of native coronary artery without angina pectoris: Secondary | ICD-10-CM | POA: Diagnosis not present

## 2019-01-03 DIAGNOSIS — E1169 Type 2 diabetes mellitus with other specified complication: Secondary | ICD-10-CM | POA: Diagnosis not present

## 2019-01-03 DIAGNOSIS — E782 Mixed hyperlipidemia: Secondary | ICD-10-CM | POA: Diagnosis not present

## 2019-01-03 DIAGNOSIS — R4181 Age-related cognitive decline: Secondary | ICD-10-CM | POA: Diagnosis not present

## 2019-02-05 ENCOUNTER — Other Ambulatory Visit: Payer: Self-pay | Admitting: Cardiology

## 2019-02-16 DIAGNOSIS — N3000 Acute cystitis without hematuria: Secondary | ICD-10-CM | POA: Diagnosis not present

## 2019-02-24 DIAGNOSIS — G301 Alzheimer's disease with late onset: Secondary | ICD-10-CM | POA: Diagnosis not present

## 2019-02-24 DIAGNOSIS — F0280 Dementia in other diseases classified elsewhere without behavioral disturbance: Secondary | ICD-10-CM | POA: Diagnosis not present

## 2019-02-24 DIAGNOSIS — I669 Occlusion and stenosis of unspecified cerebral artery: Secondary | ICD-10-CM | POA: Diagnosis not present

## 2019-04-04 DIAGNOSIS — N3000 Acute cystitis without hematuria: Secondary | ICD-10-CM | POA: Diagnosis not present

## 2019-04-04 DIAGNOSIS — I10 Essential (primary) hypertension: Secondary | ICD-10-CM | POA: Diagnosis not present

## 2019-04-05 ENCOUNTER — Other Ambulatory Visit: Payer: Self-pay | Admitting: Cardiology

## 2019-04-29 DIAGNOSIS — N3091 Cystitis, unspecified with hematuria: Secondary | ICD-10-CM | POA: Diagnosis not present

## 2019-04-29 DIAGNOSIS — N3000 Acute cystitis without hematuria: Secondary | ICD-10-CM | POA: Diagnosis not present

## 2019-05-03 ENCOUNTER — Other Ambulatory Visit: Payer: Self-pay | Admitting: Cardiology

## 2019-05-04 ENCOUNTER — Other Ambulatory Visit: Payer: Self-pay | Admitting: Cardiology

## 2019-05-04 NOTE — Telephone Encounter (Signed)
Metoprolol refill sent to [facility] [phone]

## 2019-05-05 ENCOUNTER — Other Ambulatory Visit: Payer: Self-pay | Admitting: *Deleted

## 2019-05-09 DIAGNOSIS — R0602 Shortness of breath: Secondary | ICD-10-CM | POA: Diagnosis not present

## 2019-05-09 DIAGNOSIS — N390 Urinary tract infection, site not specified: Secondary | ICD-10-CM | POA: Diagnosis not present

## 2019-05-09 DIAGNOSIS — J180 Bronchopneumonia, unspecified organism: Secondary | ICD-10-CM | POA: Diagnosis not present

## 2019-05-09 DIAGNOSIS — R509 Fever, unspecified: Secondary | ICD-10-CM | POA: Diagnosis not present

## 2019-05-09 DIAGNOSIS — R05 Cough: Secondary | ICD-10-CM | POA: Diagnosis not present

## 2019-05-09 DIAGNOSIS — Z1159 Encounter for screening for other viral diseases: Secondary | ICD-10-CM | POA: Diagnosis not present

## 2019-05-17 DIAGNOSIS — I2510 Atherosclerotic heart disease of native coronary artery without angina pectoris: Secondary | ICD-10-CM | POA: Diagnosis not present

## 2019-05-17 DIAGNOSIS — E1169 Type 2 diabetes mellitus with other specified complication: Secondary | ICD-10-CM | POA: Diagnosis not present

## 2019-05-17 DIAGNOSIS — N3000 Acute cystitis without hematuria: Secondary | ICD-10-CM | POA: Diagnosis not present

## 2019-05-17 DIAGNOSIS — E559 Vitamin D deficiency, unspecified: Secondary | ICD-10-CM | POA: Diagnosis not present

## 2019-05-17 DIAGNOSIS — I10 Essential (primary) hypertension: Secondary | ICD-10-CM | POA: Diagnosis not present

## 2019-05-17 DIAGNOSIS — K219 Gastro-esophageal reflux disease without esophagitis: Secondary | ICD-10-CM | POA: Diagnosis not present

## 2019-05-17 DIAGNOSIS — R4181 Age-related cognitive decline: Secondary | ICD-10-CM | POA: Diagnosis not present

## 2019-05-17 DIAGNOSIS — Z6823 Body mass index (BMI) 23.0-23.9, adult: Secondary | ICD-10-CM | POA: Diagnosis not present

## 2019-05-17 DIAGNOSIS — F411 Generalized anxiety disorder: Secondary | ICD-10-CM | POA: Diagnosis not present

## 2019-05-17 DIAGNOSIS — E1121 Type 2 diabetes mellitus with diabetic nephropathy: Secondary | ICD-10-CM | POA: Diagnosis not present

## 2019-05-17 DIAGNOSIS — E782 Mixed hyperlipidemia: Secondary | ICD-10-CM | POA: Diagnosis not present

## 2019-05-17 DIAGNOSIS — E038 Other specified hypothyroidism: Secondary | ICD-10-CM | POA: Diagnosis not present

## 2019-05-18 DIAGNOSIS — R799 Abnormal finding of blood chemistry, unspecified: Secondary | ICD-10-CM | POA: Diagnosis not present

## 2019-05-25 DIAGNOSIS — R799 Abnormal finding of blood chemistry, unspecified: Secondary | ICD-10-CM | POA: Diagnosis not present

## 2019-06-03 ENCOUNTER — Other Ambulatory Visit: Payer: Self-pay | Admitting: Cardiology

## 2019-06-15 DIAGNOSIS — N3000 Acute cystitis without hematuria: Secondary | ICD-10-CM | POA: Diagnosis not present

## 2019-06-26 ENCOUNTER — Other Ambulatory Visit: Payer: Self-pay | Admitting: Cardiology

## 2019-06-29 DIAGNOSIS — N3000 Acute cystitis without hematuria: Secondary | ICD-10-CM | POA: Diagnosis not present

## 2019-06-29 DIAGNOSIS — E1121 Type 2 diabetes mellitus with diabetic nephropathy: Secondary | ICD-10-CM | POA: Diagnosis not present

## 2019-07-27 DIAGNOSIS — N3001 Acute cystitis with hematuria: Secondary | ICD-10-CM | POA: Diagnosis not present

## 2019-08-01 DIAGNOSIS — E1121 Type 2 diabetes mellitus with diabetic nephropathy: Secondary | ICD-10-CM | POA: Diagnosis not present

## 2019-08-01 DIAGNOSIS — N3000 Acute cystitis without hematuria: Secondary | ICD-10-CM | POA: Diagnosis not present

## 2019-08-01 DIAGNOSIS — Z23 Encounter for immunization: Secondary | ICD-10-CM | POA: Diagnosis not present

## 2019-08-01 DIAGNOSIS — Z1231 Encounter for screening mammogram for malignant neoplasm of breast: Secondary | ICD-10-CM | POA: Diagnosis not present

## 2019-08-01 LAB — HM MAMMOGRAPHY: HM Mammogram: NORMAL (ref 0–4)

## 2019-08-23 ENCOUNTER — Ambulatory Visit: Payer: Medicare HMO | Admitting: Cardiology

## 2019-08-25 ENCOUNTER — Encounter: Payer: Self-pay | Admitting: Cardiology

## 2019-08-25 ENCOUNTER — Telehealth (INDEPENDENT_AMBULATORY_CARE_PROVIDER_SITE_OTHER): Payer: Medicare HMO | Admitting: Cardiology

## 2019-08-25 ENCOUNTER — Other Ambulatory Visit: Payer: Self-pay

## 2019-08-25 VITALS — BP 150/62 | HR 70 | Ht 62.0 in | Wt 117.6 lb

## 2019-08-25 DIAGNOSIS — E785 Hyperlipidemia, unspecified: Secondary | ICD-10-CM

## 2019-08-25 DIAGNOSIS — I2510 Atherosclerotic heart disease of native coronary artery without angina pectoris: Secondary | ICD-10-CM

## 2019-08-25 DIAGNOSIS — G301 Alzheimer's disease with late onset: Secondary | ICD-10-CM | POA: Diagnosis not present

## 2019-08-25 DIAGNOSIS — I10 Essential (primary) hypertension: Secondary | ICD-10-CM

## 2019-08-25 DIAGNOSIS — F0280 Dementia in other diseases classified elsewhere without behavioral disturbance: Secondary | ICD-10-CM

## 2019-08-25 NOTE — Progress Notes (Signed)
Virtual Visit via Video Note   This visit type was conducted due to national recommendations for restrictions regarding the COVID-19 Pandemic (e.g. social distancing) in an effort to limit this patient's exposure and mitigate transmission in our community.  Due to her co-morbid illnesses, this patient is at least at moderate risk for complications without adequate follow up.  This format is felt to be most appropriate for this patient at this time.  All issues noted in this document were discussed and addressed.  A limited physical exam was performed with this format.  Please refer to the patient's chart for her consent to telehealth for [facility].  Evaluation Performed:  Follow-up visit  This visit type was conducted due to national recommendations for restrictions regarding the COVID-19 Pandemic (e.g. social distancing).  This format is felt to be most appropriate for this patient at this time.  All issues noted in this document were discussed and addressed.  No physical exam was performed (except for noted visual exam findings with Video Visits).  Please refer to the patient's chart (MyChart message for video visits and phone note for telephone visits) for the patient's consent to telehealth for [facility].  Date:  08/25/2019  ID: Nancy Lang, [place] Oct 09, 1946, MRN [id number]   Patient Location: [place] [address]   Provider location:   [place] Office  PCP:  Rochel Brome, MD  Cardiologist:  Jenne Campus, MD     Chief Complaint: Doing well  History of Present Illness:    Nancy Lang is a 73 y.o. female  who presents via audio/video conferencing for a telehealth visit today.  Past medical history significant for coronary artery disease PTCA and drug-eluting stent to mid LAD in May 2018 also prior history of stenting to circumflex artery leading problem appears to be dementia likely seems to be stable  she comes to our office with her daughter.  I talked to them using video link.  Overall she said and her daughter confirmed that that she is doing well.  Denies having any chest pain, tightness, pressure, burning in the chest she is able to walk and do things around her house however dementia is obviously a problem she is wondering during the night she sleeps during the day.   The patient does not have symptoms concerning for COVID-19 infection (fever, chills, cough, or new SHORTNESS OF BREATH).    Prior CV studies:   The following studies were reviewed today:       Past Medical History:  Diagnosis Date  . Angina pectoris ([employer])   . Diabetes mellitus without complication ([place])   . GERD (gastroesophageal reflux disease)   . Hyperlipidemia   . Hypertension   . Thyroid disease     Past Surgical History:  Procedure Laterality Date  . CARDIAC CATHETERIZATION       Current Meds  Medication Sig  . aspirin 81 MG chewable tablet Chew 1 tablet by mouth daily.  Marland Kitchen atorvastatin (LIPITOR) 80 MG tablet TAKE 1 TABLET BY MOUTH DAILY  . Cholecalciferol (VITAMIN D3) 1000 units CAPS Take 1 capsule by mouth daily.  . DOCOSAHEXAENOIC ACID PO Take 1 g by mouth 2 (two) times daily.  Marland Kitchen donepezil (ARICEPT) 5 MG tablet Take 5 mg by mouth daily.  . Insulin Glargine (LANTUS SOLOSTAR) 100 UNIT/ML Solostar Pen Inject 40 Units into the skin at bedtime.  Marland Kitchen levothyroxine (SYNTHROID, LEVOTHROID) 50 MCG tablet Take 1 tablet by mouth  daily.  . lisinopril (PRINIVIL,ZESTRIL) 20 MG tablet Take 20 mg by mouth daily.  Marland Kitchen LORazepam (ATIVAN) 0.5 MG tablet Take 1 tablet by mouth at bedtime as needed.  . memantine (NAMENDA) 10 MG tablet Take 10 mg by mouth 2 (two) times daily.  . metoprolol tartrate (LOPRESSOR) 50 MG tablet TAKE 1 TABLET BY MOUTH TWICE DAILY  . montelukast (SINGULAIR) 10 MG tablet Take 10 mg by mouth daily.  . Multiple Vitamin (MULTI-VITAMINS) TABS Take 1 tablet by mouth daily.  Marland Kitchen omeprazole (PRILOSEC)  20 MG capsule Take 20 mg by mouth daily.  . Potassium 99 MG TABS Take 1 tablet by mouth daily.  . vitamin B-12 (CYANOCOBALAMIN) 1000 MCG tablet Take 1 tablet by mouth daily.  . vitamin E 1000 UNIT capsule Take 1 capsule by mouth daily.      Family History: The patient's family history includes Alcohol abuse in her father; Cancer in her mother; Diabetes in her mother; Heart disease in her father and mother.   ROS:   Please see the history of present illness.     All other systems reviewed and are negative.   Labs/Other Tests and Data Reviewed:     Recent Labs: No results found for requested labs within last 8760 hours.  Recent Lipid Panel    Component Value Date/Time   CHOL 113 06/02/2017 1214   TRIG 154 (H) 06/02/2017 1214   HDL 46 06/02/2017 1214   CHOLHDL 2.5 06/02/2017 1214   LDLCALC 36 06/02/2017 1214      Exam:    Vital Signs:  BP (!) 150/62   Pulse 70   Ht 5\' 2"  (1.575 m)   Wt 117 lb 9.6 oz (53.3 kg)   SpO2 98%   BMI 21.51 kg/m     Wt Readings from Last 3 Encounters:  08/25/19 117 lb 9.6 oz (53.3 kg)  02/16/18 120 lb 12.8 oz (54.8 kg)  09/02/17 123 lb 1.3 oz (55.8 kg)     Well nourished, well developed in no acute distress. Alert awake in exam 3 she is sitting in our office I talked to her today detailing from my home she is in the room with her daughter 82 of conversation goes with her daughter.  She is not in any distress at the moment of my interview she is wearing mask she seems to be cheerful today.  Diagnosis for this visit:   1. Coronary artery disease involving native coronary artery of native heart without angina pectoris   2. Essential hypertension   3. Late onset Alzheimer's disease without behavioral disturbance ([place])   4. Dyslipidemia (high LDL; low HDL)      ASSESSMENT & PLAN:    1.  Coronary artery disease status post PTCA and stenting in 2018 last time.  Appears to be stable without any symptoms we will continue present  management. 2.  Essential hypertension blood pressure well controlled. 3.  Late onset Alzheimer.  Stable. 4.  Dyslipidemia we will contact her primary care physician to get copy of her fasting lipid profile. I will ask her to have an echocardiogram to assess left ventricular ejection fraction.  COVID-19 Education: The signs and symptoms of COVID-19 were discussed with the patient and how to seek care for testing (follow up with PCP or arrange E-visit).  The importance of social distancing was discussed today.  Patient Risk:   After full review of this patients clinical status, I feel that they are at least moderate risk at this time.  Time:   Today, I have spent 5 minutes with the patient with telehealth technology discussing pt health issues.  I spent 15 minutes reviewing her chart before the visit.  Visit was finished at 8:55 AM.    Medication Adjustments/Labs and Tests Ordered: Current medicines are reviewed at length with the patient today.  Concerns regarding medicines are outlined above.  No orders of the defined types were placed in this encounter.  Medication changes: No orders of the defined types were placed in this encounter.    Disposition: Follow-up 6 months  Signed, Park Liter, MD, [facility] 08/25/2019 8:52 AM    [place]

## 2019-08-25 NOTE — Patient Instructions (Signed)
Medication Instructions:  Your physician recommends that you continue on your current medications as directed. Please refer to the Current Medication list given to you today.  *If you need a refill on your cardiac medications before your next appointment, please call your pharmacy*  Lab Work: None.  If you have labs (blood work) drawn today and your tests are completely normal, you will receive your results only by: Marland Kitchen MyChart Message (if you have MyChart) OR . A paper copy in the mail If you have any lab test that is abnormal or we need to change your treatment, we will call you to review the results.  Testing/Procedures: Your physician has requested that you have an echocardiogram. Echocardiography is a painless test that uses sound waves to create images of your heart. It provides your doctor with information about the size and shape of your heart and how well your heart's chambers and valves are working. This procedure takes approximately one hour. There are no restrictions for this procedure.    Follow-Up: At [facility], you and your health needs are our priority.  As part of our continuing mission to provide you with exceptional heart care, we have created designated Provider Care Teams.  These Care Teams include your primary Cardiologist (physician) and Advanced Practice Providers (APPs -  Physician Assistants and Nurse Practitioners) who all work together to provide you with the care you need, when you need it.  Your next appointment:   6 months  The format for your next appointment:   In Person  Provider:   You will see Dr. Agustin Cree.  Or, you can be scheduled with the following Advanced Practice Provider on your designated Care Team (at our [facility]):  Laurann [place], FNP    Other Instructions   Echocardiogram An echocardiogram is a procedure that uses painless sound waves (ultrasound) to produce an image of the heart. Images from an echocardiogram can  provide important information about:  Signs of coronary artery disease (CAD).  Aneurysm detection. An aneurysm is a weak or damaged part of an artery wall that bulges out from the normal force of blood pumping through the body.  Heart size and shape. Changes in the size or shape of the heart can be associated with certain conditions, including heart failure, aneurysm, and CAD.  Heart muscle function.  Heart valve function.  Signs of a past heart attack.  Fluid buildup around the heart.  Thickening of the heart muscle.  A tumor or infectious growth around the heart valves. Tell a health care provider about:  Any allergies you have.  All medicines you are taking, including vitamins, herbs, eye drops, creams, and over-the-counter medicines.  Any blood disorders you have.  Any surgeries you have had.  Any medical conditions you have.  Whether you are pregnant or may be pregnant. What are the risks? Generally, this is a safe procedure. However, problems may occur, including:  Allergic reaction to dye (contrast) that may be used during the procedure. What happens before the procedure? No specific preparation is needed. You may eat and drink normally. What happens during the procedure?   An IV tube may be inserted into one of your veins.  You may receive contrast through this tube. A contrast is an injection that improves the quality of the pictures from your heart.  A gel will be applied to your chest.  A wand-like tool (transducer) will be moved over your chest. The gel will help to transmit the sound waves  from the transducer.  The sound waves will harmlessly bounce off of your heart to allow the heart images to be captured in real-time motion. The images will be recorded on a computer. The procedure may vary among health care providers and hospitals. What happens after the procedure?  You may return to your normal, everyday life, including diet, activities, and  medicines, unless your health care provider tells you not to do that. Summary  An echocardiogram is a procedure that uses painless sound waves (ultrasound) to produce an image of the heart.  Images from an echocardiogram can provide important information about the size and shape of your heart, heart muscle function, heart valve function, and fluid buildup around your heart.  You do not need to do anything to prepare before this procedure. You may eat and drink normally.  After the echocardiogram is completed, you may return to your normal, everyday life, unless your health care provider tells you not to do that. This information is not intended to replace advice given to you by your health care provider. Make sure you discuss any questions you have with your health care provider. Document Released: 10/09/2000 Document Revised: 02/02/2019 Document Reviewed: 11/14/2016 Elsevier Patient Education  2020 [employer].

## 2019-08-28 ENCOUNTER — Telehealth: Payer: Self-pay | Admitting: Cardiology

## 2019-08-28 MED ORDER — ATORVASTATIN CALCIUM 80 MG PO TABS: 80.0000 mg | ORAL_TABLET | Freq: Every day | ORAL | 1 refills | Status: DC

## 2019-08-28 NOTE — Telephone Encounter (Signed)
°*  STAT* If patient is at the pharmacy, call can be transferred to refill team.   1. Which medications need to be refilled? (please list name of each medication and dose if known) Atorvastatin   2. Which pharmacy/location (including street and city if local pharmacy) is medication to be sent to?[facility] [address]  3. Do they need a 30 day or 90 day supply? [place]

## 2019-08-28 NOTE — Addendum Note (Signed)
Addended by: Ashok Norris on: 08/28/2019 01:58 PM   Modules accepted: Orders

## 2019-08-28 NOTE — Telephone Encounter (Addendum)
Atorvastatin 80 mg daily refilled.

## 2019-08-30 DIAGNOSIS — N3000 Acute cystitis without hematuria: Secondary | ICD-10-CM | POA: Diagnosis not present

## 2019-09-04 DIAGNOSIS — K219 Gastro-esophageal reflux disease without esophagitis: Secondary | ICD-10-CM | POA: Diagnosis not present

## 2019-09-04 DIAGNOSIS — E782 Mixed hyperlipidemia: Secondary | ICD-10-CM | POA: Diagnosis not present

## 2019-09-04 DIAGNOSIS — R4181 Age-related cognitive decline: Secondary | ICD-10-CM | POA: Diagnosis not present

## 2019-09-04 DIAGNOSIS — E038 Other specified hypothyroidism: Secondary | ICD-10-CM | POA: Diagnosis not present

## 2019-09-04 DIAGNOSIS — I10 Essential (primary) hypertension: Secondary | ICD-10-CM | POA: Diagnosis not present

## 2019-09-04 DIAGNOSIS — I2510 Atherosclerotic heart disease of native coronary artery without angina pectoris: Secondary | ICD-10-CM | POA: Diagnosis not present

## 2019-09-04 DIAGNOSIS — Z6823 Body mass index (BMI) 23.0-23.9, adult: Secondary | ICD-10-CM | POA: Diagnosis not present

## 2019-09-04 DIAGNOSIS — E1121 Type 2 diabetes mellitus with diabetic nephropathy: Secondary | ICD-10-CM | POA: Diagnosis not present

## 2019-09-04 DIAGNOSIS — F411 Generalized anxiety disorder: Secondary | ICD-10-CM | POA: Diagnosis not present

## 2019-09-04 DIAGNOSIS — E1169 Type 2 diabetes mellitus with other specified complication: Secondary | ICD-10-CM | POA: Diagnosis not present

## 2019-09-04 LAB — HEMOGLOBIN A1C: Hemoglobin A1C: 7.4

## 2019-09-13 DIAGNOSIS — G301 Alzheimer's disease with late onset: Secondary | ICD-10-CM | POA: Diagnosis not present

## 2019-09-13 DIAGNOSIS — I669 Occlusion and stenosis of unspecified cerebral artery: Secondary | ICD-10-CM | POA: Diagnosis not present

## 2019-09-13 DIAGNOSIS — F0280 Dementia in other diseases classified elsewhere without behavioral disturbance: Secondary | ICD-10-CM | POA: Diagnosis not present

## 2019-09-21 ENCOUNTER — Other Ambulatory Visit: Payer: Self-pay | Admitting: Cardiology

## 2019-09-25 NOTE — Telephone Encounter (Signed)
Metoprolol refill sent to [facility] in [place]

## 2019-09-28 DIAGNOSIS — R300 Dysuria: Secondary | ICD-10-CM | POA: Diagnosis not present

## 2019-10-05 ENCOUNTER — Other Ambulatory Visit: Payer: Self-pay

## 2019-10-05 ENCOUNTER — Ambulatory Visit: Payer: Medicare HMO

## 2019-10-05 DIAGNOSIS — I10 Essential (primary) hypertension: Secondary | ICD-10-CM

## 2019-10-05 DIAGNOSIS — I25110 Atherosclerotic heart disease of native coronary artery with unstable angina pectoris: Secondary | ICD-10-CM

## 2019-10-05 DIAGNOSIS — I2510 Atherosclerotic heart disease of native coronary artery without angina pectoris: Secondary | ICD-10-CM

## 2019-10-05 NOTE — Progress Notes (Signed)
2D Echocardiogram has been performed. [facility] Donalyn Schneeberger 10/05/19 10:06

## 2019-10-09 ENCOUNTER — Telehealth: Payer: Self-pay | Admitting: Emergency Medicine

## 2019-10-09 ENCOUNTER — Telehealth: Payer: Self-pay | Admitting: Cardiology

## 2019-10-09 NOTE — Telephone Encounter (Signed)
Left message for patient to return call.

## 2019-10-09 NOTE — Telephone Encounter (Signed)
Returning call about results

## 2019-10-09 NOTE — Telephone Encounter (Signed)
Left message for patient to return call regarding echocardiogram results  

## 2019-10-09 NOTE — Telephone Encounter (Signed)
Left message for patient to return call regarding results  

## 2019-10-17 NOTE — Telephone Encounter (Signed)
Left message for patient to return call.

## 2019-10-18 NOTE — Telephone Encounter (Signed)
Patient daughter per dpr informed of results.  

## 2019-12-06 ENCOUNTER — Encounter: Payer: Self-pay | Admitting: Physician Assistant

## 2019-12-06 ENCOUNTER — Other Ambulatory Visit: Payer: Self-pay

## 2019-12-06 ENCOUNTER — Ambulatory Visit (INDEPENDENT_AMBULATORY_CARE_PROVIDER_SITE_OTHER): Payer: Medicare HMO | Admitting: Physician Assistant

## 2019-12-06 VITALS — BP 142/62 | HR 57 | Temp 95.5°F | Resp 16 | Ht 63.0 in | Wt 113.0 lb

## 2019-12-06 DIAGNOSIS — F0390 Unspecified dementia without behavioral disturbance: Secondary | ICD-10-CM

## 2019-12-06 DIAGNOSIS — E538 Deficiency of other specified B group vitamins: Secondary | ICD-10-CM | POA: Diagnosis not present

## 2019-12-06 DIAGNOSIS — I10 Essential (primary) hypertension: Secondary | ICD-10-CM

## 2019-12-06 DIAGNOSIS — E782 Mixed hyperlipidemia: Secondary | ICD-10-CM

## 2019-12-06 DIAGNOSIS — E559 Vitamin D deficiency, unspecified: Secondary | ICD-10-CM | POA: Diagnosis not present

## 2019-12-06 DIAGNOSIS — E039 Hypothyroidism, unspecified: Secondary | ICD-10-CM

## 2019-12-06 DIAGNOSIS — E119 Type 2 diabetes mellitus without complications: Secondary | ICD-10-CM | POA: Diagnosis not present

## 2019-12-06 DIAGNOSIS — Z794 Long term (current) use of insulin: Secondary | ICD-10-CM | POA: Diagnosis not present

## 2019-12-06 HISTORY — DX: Mixed hyperlipidemia: E78.2

## 2019-12-06 MED ORDER — LORAZEPAM 1 MG PO TABS: 1.0000 mg | ORAL_TABLET | Freq: Every day | ORAL | 1 refills | Status: DC

## 2019-12-06 NOTE — Progress Notes (Signed)
Established Patient Office Visit  Subjective:  Patient ID: Nancy Lang, female    DOB: 01-13-1946  Age: 74 y.o. MRN: [id number]  CC:  Chief Complaint  Patient presents with  . Follow-up  . Diabetes  . Dementia    HPI Nancy Lang presents for chronic follow up   Mixed hyperlipidemia  Pt presents with hyperlipidemia. . Compliance with treatment has been good The patient is compliant with medications, maintains a low cholesterol diet , follows up as directed , and maintains an exercise regimen . The patient denies experiencing any hypercholesterolemia related symptoms.  Evidenced based information based on history , exam, and other sources has been used  for decision making.  Pt presents for follow up of hypertension.  The patient is tolerating the medication well without side effects. Compliance with treatment has been good; including taking medication as directed , maintains a healthy diet and regular exercise regimen , and following up as directed. Patient was evaluated using exam, physical, labs and other information to perform evidence based decision making.  Dementia: She is here for evaluation and treatment of cognitive problems. She is accompanied by granddaughter. Primary caregiver is patient and daughter. The family and the patient identify problems with changes in short and long term memory. Family and patient report problems with agitation.  Medication administration: family monitors medication usage   Functional Assessment:  Activities of Daily Living (ADLs):   She is independent in the following: feeding Requires assistance with the following: bathing and hygiene, toileting and dressing   Hypothyroidism: Patient presents for evaluation of thyroid function. Currently no symptoms.The problem has been unchanged.  Previous thyroid studies include TSH. The hypothyroidism is due to hypothyroidism.  Pt for follow up for diabetes - states glucose  ranges are good in the mornings between 100-150 - family did decrease her lantus down to 25 units at bedtime because her sugars had started dropping - due for labwork today Pt not able to get ua  Past Medical History:  Diagnosis Date  . Angina pectoris ([place])   . Diabetes mellitus without complication ([place])   . GERD (gastroesophageal reflux disease)   . Hyperlipidemia   . Hypertension   . Thyroid disease     Past Surgical History:  Procedure Laterality Date  . CARDIAC CATHETERIZATION      Family History  Problem Relation Age of Onset  . Diabetes Mother   . Cancer Mother   . Heart disease Mother   . Heart disease Father   . Alcohol abuse Father     Social History   Socioeconomic History  . Marital status: Widowed    Spouse name: Not on file  . Number of children: Not on file  . Years of education: Not on file  . Highest education level: Not on file  Occupational History  . Occupation: retired  Tobacco Use  . Smoking status: Never Smoker  . Smokeless tobacco: Current User    Types: Snuff  Substance and Sexual Activity  . Alcohol use: No  . Drug use: No  . Sexual activity: Not on file  Other Topics Concern  . Not on file  Social History Narrative  . Not on file   Social Determinants of Health   Financial Resource Strain:   . Difficulty of Paying Living Expenses: Not on file  Food Insecurity:   . Worried About Charity fundraiser in the Last Year: Not on file  . Ran Out of Food in  the Last Year: Not on file  Transportation Needs:   . Lack of Transportation (Medical): Not on file  . Lack of Transportation (Non-Medical): Not on file  Physical Activity:   . Days of Exercise per Week: Not on file  . Minutes of Exercise per Session: Not on file  Stress:   . Feeling of Stress : Not on file  Social Connections:   . Frequency of Communication with Friends and Family: Not on file  . Frequency of Social Gatherings with Friends and Family: Not on file  . Attends  Religious Services: Not on file  . Active Member of Clubs or Organizations: Not on file  . Attends Archivist Meetings: Not on file  . Marital Status: Not on file  Intimate Partner Violence:   . Fear of Current or Ex-Partner: Not on file  . Emotionally Abused: Not on file  . Physically Abused: Not on file  . Sexually Abused: Not on file     Current Outpatient Medications:  .  LORazepam (ATIVAN) 1 MG tablet, Take 1 mg by mouth at bedtime., Disp: , Rfl:  .  Accu-Chek FastClix Lancets MISC, , Disp: , Rfl:  .  ACCU-CHEK GUIDE test strip, , Disp: , Rfl:  .  aspirin 81 MG chewable tablet, Chew 1 tablet by mouth daily., Disp: , Rfl:  .  atorvastatin (LIPITOR) 80 MG tablet, Take 1 tablet (80 mg total) by mouth daily., Disp: 90 tablet, Rfl: 1 .  BD PEN NEEDLE NANO 2ND GEN 32G X 4 MM MISC, , Disp: , Rfl:  .  Cholecalciferol (VITAMIN D3) 1000 units CAPS, Take 1 capsule by mouth daily., Disp: , Rfl:  .  donepezil (ARICEPT) 10 MG tablet, , Disp: , Rfl:  .  Insulin Glargine (LANTUS SOLOSTAR) 100 UNIT/ML Solostar Pen, Inject 25 Units into the skin at bedtime., Disp: , Rfl:  .  levothyroxine (SYNTHROID, LEVOTHROID) 50 MCG tablet, Take 1 tablet by mouth daily., Disp: , Rfl:  .  lisinopril (ZESTRIL) 10 MG tablet, , Disp: , Rfl:  .  memantine (NAMENDA) 10 MG tablet, Take 10 mg by mouth 2 (two) times daily., Disp: , Rfl:  .  metoprolol tartrate (LOPRESSOR) 50 MG tablet, TAKE 1 TABLET BY MOUTH TWICE DAILY, Disp: 180 tablet, Rfl: 0 .  montelukast (SINGULAIR) 10 MG tablet, Take 10 mg by mouth daily., Disp: , Rfl:  .  Multiple Vitamin (MULTI-VITAMIN) tablet, Take by mouth., Disp: , Rfl:  .  nitrofurantoin, macrocrystal-monohydrate, (MACROBID) 100 MG capsule, , Disp: , Rfl:  .  Omega-3 1000 MG CAPS, Take by mouth., Disp: , Rfl:  .  pantoprazole (PROTONIX) 40 MG tablet, Take 40 mg by mouth daily., Disp: , Rfl:  .  Potassium 99 MG TABS, Take 1 tablet by mouth daily., Disp: , Rfl:  .  vitamin B-12  (CYANOCOBALAMIN) 1000 MCG tablet, Take 1 tablet by mouth daily., Disp: , Rfl:    Allergies  Allergen Reactions  . Pravastatin     ANGER AND MEMORY ISSUES  . Fenofibrate Rash  . Oxytetracycline Rash  . Penicillins Rash  . Septra [Sulfamethoxazole-Trimethoprim] Rash  . Sulfa Antibiotics Rash  . Tetracyclines & Related Rash    ROS CONSTITUTIONAL: Negative for chills, fatigue, fever, unintentional weight gain and unintentional weight loss.  E/N/T: Negative for ear pain, nasal congestion and sore throat.  CARDIOVASCULAR: Negative for chest pain, dizziness, palpitations and pedal edema.  RESPIRATORY: Negative for recent cough and dyspnea.  GASTROINTESTINAL: Negative for abdominal pain, acid reflux  symptoms, constipation, diarrhea, nausea and vomiting.  MSK: Negative for arthralgias and myalgias.  INTEGUMENTARY: Negative for rash.  NEUROLOGICAL: Negative for dizziness and headaches.  PSYCHIATRIC: Negative for sleep disturbance and to question depression screen.  Negative for depression, negative for anhedonia.        Objective:    PHYSICAL EXAM:   VS: BP (!) 142/62   Pulse (!) 57   Temp (!) 95.5 F (35.3 C)   Resp 16   Ht 5' 3"  (1.6 m)   Wt 113 lb (51.3 kg)   SpO2 98%   BMI 20.02 kg/m   GEN: Well nourished, well developed, in no acute distress  HEENT: normal external ears and nose - normal external auditory canals and TMS - hearing grossly normal - normal nasal mucosa and septum - Lips, Teeth and Gums - normal  Oropharynx - normal mucosa, palate, and posterior pharynx Neck: no JVD or masses - no thyromegaly Cardiac: RRR; no murmurs, rubs, or gallops,no edema - no significant varicosities Respiratory:  normal respiratory rate and pattern with no distress - normal breath sounds with no rales, rhonchi, wheezes or rubs GI: normal bowel sounds, no masses or tenderness MS: no deformity or atrophy  Skin: warm and dry, no rash  Neuro:  Alert and Oriented x 3, Strength and  sensation are intact - CN II-Xii grossly intact Psych: euthymic mood, appropriate affect and demeanor  BP (!) 142/62   Pulse (!) 57   Temp (!) 95.5 F (35.3 C)   Resp 16   Ht 5' 3"  (1.6 m)   Wt 113 lb (51.3 kg)   SpO2 98%   BMI 20.02 kg/m  Wt Readings from Last 3 Encounters:  12/06/19 113 lb (51.3 kg)  08/25/19 117 lb 9.6 oz (53.3 kg)  02/16/18 120 lb 12.8 oz (54.8 kg)     Health Maintenance Due  Topic Date Due  . Hepatitis C Screening  1946/05/09  . FOOT EXAM  02/10/1955  . OPHTHALMOLOGY EXAM  02/10/1955  . TETANUS/TDAP  02/10/1964  . DEXA SCAN  02/09/2010  . PNA vac Low Risk Adult (2 of 2 - PCV13) 02/10/2011    There are no preventive care reminders to display for this patient.  No results found for: TSH No results found for: WBC, HGB, HCT, MCV, PLT No results found for: NA, K, CHLORIDE, CO2, GLUCOSE, BUN, CREATININE, BILITOT, ALKPHOS, AST, ALT, PROT, ALBUMIN, CALCIUM, ANIONGAP, EGFR, GFR Lab Results  Component Value Date   CHOL 113 06/02/2017   Lab Results  Component Value Date   HDL 46 06/02/2017   Lab Results  Component Value Date   LDLCALC 36 06/02/2017   Lab Results  Component Value Date   TRIG 154 (H) 06/02/2017   Lab Results  Component Value Date   CHOLHDL 2.5 06/02/2017   Lab Results  Component Value Date   HGBA1C 7.4 09/04/2019      Assessment & Plan:   Problem List Items Addressed This Visit      Cardiovascular and Mediastinum   Essential hypertension, benign   Relevant Medications   lisinopril (ZESTRIL) 10 MG tablet   Other Relevant Orders   CBC with Differential/Platelet   Comprehensive metabolic panel     Endocrine   Acquired hypothyroidism   Relevant Orders   TSH   Insulin dependent type 2 diabetes mellitus, controlled ([place]) - Primary   Relevant Medications   lisinopril (ZESTRIL) 10 MG tablet   Other Relevant Orders   Hemoglobin A1c  Nervous and Auditory   Dementia without behavioral disturbance (H[facility])   Relevant  Medications   donepezil (ARICEPT) 10 MG tablet   LORazepam (ATIVAN) 1 MG tablet     Other   Vitamin D deficiency   Relevant Orders   VITAMIN D 25 Hydroxy (Vit-D Deficiency, Fractures)   Mixed hyperlipidemia   Relevant Medications   lisinopril (ZESTRIL) 10 MG tablet   Other Relevant Orders   Lipid panel    Other Visit Diagnoses    B12 deficiency       Relevant Orders   Vitamin B12      No orders of the defined types were placed in this encounter.   Follow-up: Return in about 3 months (around 03/04/2020).    SARA R Jahmez Bily, PA-C

## 2019-12-06 NOTE — Assessment & Plan Note (Signed)
Continue current meds labwork pending 

## 2019-12-07 LAB — COMPREHENSIVE METABOLIC PANEL
ALT: 27 IU/L (ref 0–32)
AST: 22 IU/L (ref 0–40)
Albumin/Globulin Ratio: 2.5 — ABNORMAL HIGH (ref 1.2–2.2)
Albumin: 4.7 g/dL (ref 3.7–4.7)
Alkaline Phosphatase: 104 IU/L (ref 39–117)
BUN/Creatinine Ratio: 16 (ref 12–28)
BUN: 16 mg/dL (ref 8–27)
Bilirubin Total: 1.6 mg/dL — ABNORMAL HIGH (ref 0.0–1.2)
CO2: 24 mmol/L (ref 20–29)
Calcium: 9.7 mg/dL (ref 8.7–10.3)
Chloride: 105 mmol/L (ref 96–106)
Creatinine, Ser: 0.98 mg/dL (ref 0.57–1.00)
GFR calc Af Amer: 66 mL/min/{1.73_m2} (ref >59)
GFR calc non Af Amer: 57 mL/min/{1.73_m2} — ABNORMAL LOW (ref >59)
Globulin, Total: 1.9 g/dL (ref 1.5–4.5)
Glucose: 83 mg/dL (ref 65–99)
Potassium: 4.2 mmol/L (ref 3.5–5.2)
Sodium: 145 mmol/L — ABNORMAL HIGH (ref 134–144)
Total Protein: 6.6 g/dL (ref 6.0–8.5)

## 2019-12-07 LAB — LIPID PANEL
Chol/HDL Ratio: 2.8 ratio (ref 0.0–4.4)
Cholesterol, Total: 116 mg/dL (ref 100–199)
HDL: 41 mg/dL (ref >39)
LDL Chol Calc (NIH): 47 mg/dL (ref 0–99)
Triglycerides: 168 mg/dL — ABNORMAL HIGH (ref 0–149)
VLDL Cholesterol Cal: 28 mg/dL (ref 5–40)

## 2019-12-07 LAB — CBC WITH DIFFERENTIAL/PLATELET
Basophils Absolute: 0.1 10*3/uL (ref 0.0–0.2)
Basos: 1 %
EOS (ABSOLUTE): 0.1 10*3/uL (ref 0.0–0.4)
Eos: 1 %
Hematocrit: 39.6 % (ref 34.0–46.6)
Hemoglobin: 13.2 g/dL (ref 11.1–15.9)
Immature Grans (Abs): 0 10*3/uL (ref 0.0–0.1)
Immature Granulocytes: 0 %
Lymphocytes Absolute: 1.4 10*3/uL (ref 0.7–3.1)
Lymphs: 19 %
MCH: 31.1 pg (ref 26.6–33.0)
MCHC: 33.3 g/dL (ref 31.5–35.7)
MCV: 93 fL (ref 79–97)
Monocytes Absolute: 0.6 10*3/uL (ref 0.1–0.9)
Monocytes: 8 %
Neutrophils Absolute: 5.3 10*3/uL (ref 1.4–7.0)
Neutrophils: 71 %
Platelets: 304 10*3/uL (ref 150–450)
RBC: 4.24 x10E6/uL (ref 3.77–5.28)
RDW: 14.1 % (ref 11.7–15.4)
WBC: 7.6 10*3/uL (ref 3.4–10.8)

## 2019-12-07 LAB — HEMOGLOBIN A1C
Est. average glucose Bld gHb Est-mCnc: 169 mg/dL
Hgb A1c MFr Bld: 7.5 % — ABNORMAL HIGH (ref 4.8–5.6)

## 2019-12-07 LAB — VITAMIN D 25 HYDROXY (VIT D DEFICIENCY, FRACTURES): Vit D, 25-Hydroxy: 50.7 ng/mL (ref 30.0–100.0)

## 2019-12-07 LAB — CARDIOVASCULAR RISK ASSESSMENT

## 2019-12-07 LAB — TSH: TSH: 1.86 u[IU]/mL (ref 0.450–4.500)

## 2019-12-07 LAB — VITAMIN B12: Vitamin B-12: 1159 pg/mL (ref 232–1245)

## 2019-12-08 ENCOUNTER — Other Ambulatory Visit: Payer: Self-pay

## 2019-12-08 MED ORDER — PANTOPRAZOLE SODIUM 40 MG PO TBEC: 40.0000 mg | DELAYED_RELEASE_TABLET | Freq: Every day | ORAL | 1 refills | Status: DC

## 2019-12-11 ENCOUNTER — Other Ambulatory Visit: Payer: Self-pay | Admitting: Physician Assistant

## 2019-12-11 MED ORDER — PANTOPRAZOLE SODIUM 40 MG PO TBEC: 40.0000 mg | DELAYED_RELEASE_TABLET | Freq: Every day | ORAL | 1 refills | Status: DC

## 2019-12-12 ENCOUNTER — Other Ambulatory Visit: Payer: Self-pay | Admitting: Physician Assistant

## 2019-12-12 DIAGNOSIS — N3000 Acute cystitis without hematuria: Secondary | ICD-10-CM

## 2019-12-14 ENCOUNTER — Other Ambulatory Visit: Payer: Self-pay | Admitting: Physician Assistant

## 2019-12-14 MED ORDER — ACCU-CHEK GUIDE VI STRP: ORAL_STRIP | 3 refills | Status: DC

## 2019-12-19 ENCOUNTER — Other Ambulatory Visit: Payer: Self-pay | Admitting: Physician Assistant

## 2019-12-19 MED ORDER — ACCU-CHEK GUIDE VI STRP: ORAL_STRIP | 3 refills | Status: DC

## 2020-01-03 ENCOUNTER — Other Ambulatory Visit: Payer: Self-pay | Admitting: Cardiology

## 2020-01-14 ENCOUNTER — Other Ambulatory Visit: Payer: Self-pay | Admitting: Physician Assistant

## 2020-02-11 DIAGNOSIS — R079 Chest pain, unspecified: Secondary | ICD-10-CM | POA: Diagnosis not present

## 2020-02-11 DIAGNOSIS — J069 Acute upper respiratory infection, unspecified: Secondary | ICD-10-CM | POA: Diagnosis not present

## 2020-02-11 DIAGNOSIS — B9689 Other specified bacterial agents as the cause of diseases classified elsewhere: Secondary | ICD-10-CM | POA: Diagnosis not present

## 2020-02-11 DIAGNOSIS — E119 Type 2 diabetes mellitus without complications: Secondary | ICD-10-CM | POA: Diagnosis not present

## 2020-02-14 ENCOUNTER — Other Ambulatory Visit: Payer: Self-pay | Admitting: Physician Assistant

## 2020-02-15 ENCOUNTER — Ambulatory Visit: Payer: Medicare HMO | Admitting: Legal Medicine

## 2020-02-16 ENCOUNTER — Telehealth: Payer: Self-pay

## 2020-02-16 NOTE — Telephone Encounter (Signed)
Aldona Bar called to report that Nancy Lang is having symptoms of pink eye.  She is experiencing redness and drainage.  She is scheduled to be seen in the office on Monday.  Dr. Tobie Poet will send medication to use over the week-end.

## 2020-02-17 ENCOUNTER — Other Ambulatory Visit: Payer: Self-pay | Admitting: Family Medicine

## 2020-02-17 MED ORDER — CIPROFLOXACIN HCL 0.3 % OP SOLN: 2.0000 [drp] | OPHTHALMIC | 0 refills | Status: DC

## 2020-02-18 ENCOUNTER — Other Ambulatory Visit: Payer: Self-pay | Admitting: Cardiology

## 2020-02-19 ENCOUNTER — Inpatient Hospital Stay: Payer: Medicare HMO | Admitting: Physician Assistant

## 2020-02-22 ENCOUNTER — Telehealth: Payer: Self-pay

## 2020-02-22 NOTE — Telephone Encounter (Signed)
Left message for patient requesting call back to schedule retinopathy screening in our office on May 6

## 2020-02-26 ENCOUNTER — Inpatient Hospital Stay: Payer: Medicare HMO | Admitting: Physician Assistant

## 2020-03-05 ENCOUNTER — Other Ambulatory Visit: Payer: Self-pay

## 2020-03-05 ENCOUNTER — Ambulatory Visit (INDEPENDENT_AMBULATORY_CARE_PROVIDER_SITE_OTHER): Payer: Medicare HMO | Admitting: Physician Assistant

## 2020-03-05 ENCOUNTER — Encounter: Payer: Self-pay | Admitting: Physician Assistant

## 2020-03-05 VITALS — BP 154/70 | HR 62 | Temp 97.4°F | Resp 16 | Wt 114.0 lb

## 2020-03-05 DIAGNOSIS — F0280 Dementia in other diseases classified elsewhere without behavioral disturbance: Secondary | ICD-10-CM

## 2020-03-05 DIAGNOSIS — E559 Vitamin D deficiency, unspecified: Secondary | ICD-10-CM | POA: Diagnosis not present

## 2020-03-05 DIAGNOSIS — E039 Hypothyroidism, unspecified: Secondary | ICD-10-CM | POA: Diagnosis not present

## 2020-03-05 DIAGNOSIS — I2510 Atherosclerotic heart disease of native coronary artery without angina pectoris: Secondary | ICD-10-CM | POA: Diagnosis not present

## 2020-03-05 DIAGNOSIS — Z794 Long term (current) use of insulin: Secondary | ICD-10-CM

## 2020-03-05 DIAGNOSIS — E782 Mixed hyperlipidemia: Secondary | ICD-10-CM

## 2020-03-05 DIAGNOSIS — G301 Alzheimer's disease with late onset: Secondary | ICD-10-CM

## 2020-03-05 DIAGNOSIS — E119 Type 2 diabetes mellitus without complications: Secondary | ICD-10-CM | POA: Diagnosis not present

## 2020-03-05 DIAGNOSIS — I10 Essential (primary) hypertension: Secondary | ICD-10-CM

## 2020-03-05 MED ORDER — ATORVASTATIN CALCIUM 80 MG PO TABS: 80.0000 mg | ORAL_TABLET | Freq: Every day | ORAL | 3 refills | Status: DC

## 2020-03-05 MED ORDER — LANTUS SOLOSTAR 100 UNIT/ML ~~LOC~~ SOPN: PEN_INJECTOR | SUBCUTANEOUS | 3 refills | Status: DC

## 2020-03-05 MED ORDER — NITROFURANTOIN MONOHYD MACRO 100 MG PO CAPS: 100.0000 mg | ORAL_CAPSULE | Freq: Every day | ORAL | 1 refills | Status: DC

## 2020-03-05 MED ORDER — METOPROLOL TARTRATE 50 MG PO TABS: 50.0000 mg | ORAL_TABLET | Freq: Two times a day (BID) | ORAL | 3 refills | Status: DC

## 2020-03-05 MED ORDER — NITROFURANTOIN MONOHYD MACRO 100 MG PO CAPS: 100.0000 mg | ORAL_CAPSULE | Freq: Every day | ORAL | 3 refills | Status: DC

## 2020-03-05 MED ORDER — LISINOPRIL 10 MG PO TABS: ORAL_TABLET | ORAL | 3 refills | Status: DC

## 2020-03-05 MED ORDER — MONTELUKAST SODIUM 10 MG PO TABS: 10.0000 mg | ORAL_TABLET | Freq: Every day | ORAL | 3 refills | Status: DC

## 2020-03-05 NOTE — Assessment & Plan Note (Signed)
Well controlled.  ?No changes to medicines.  ?Continue to work on eating a healthy diet and exercise.  ?Labs drawn today.  ?

## 2020-03-05 NOTE — Assessment & Plan Note (Signed)
Continue current meds and follow up with neurology as directed

## 2020-03-05 NOTE — Assessment & Plan Note (Signed)
Continue current med - labwork pending

## 2020-03-05 NOTE — Progress Notes (Signed)
Established Patient Office Visit  Subjective:  Patient ID: Nancy Lang, female    DOB: [birth year]  Age: 74 y.o. MRN: [id number]  CC:  Chief Complaint  Patient presents with  . Hypertension  . Hyperlipidemia  . Diabetes    HPI [facility] presents for chronic follow up   Mixed hyperlipidemia  Pt presents with hyperlipidemia. . Compliance with treatment has been good The patient is compliant with medications, maintains a low cholesterol diet , follows up as directed , and maintains an exercise regimen . The patient denies experiencing any hypercholesterolemia related symptoms.  She is currently taking lipitor 80mg  qd  Pt presents for follow up of hypertension.  The patient is tolerating the medication well without side effects. Compliance with treatment has been good; including taking medication as directed , maintains a healthy diet and regular exercise regimen , and following up as directed. Pt currently on zestril 10mg  qd and metoprolol 50mg  qd  Dementia: She is here for follow upt of cognitive problems. She is accompanied by granddaughter and daughter.  Primary caregiver are them. The family and the patient identify problems with changes in short and long term memory. Family and patient report problems with agitation.  Medication administration: family monitors medication usage   Functional Assessment:  Activities of Daily Living (ADLs):   She is independent in the following: feeding Requires assistance with the following: bathing and hygiene, toileting and dressing Currently on Namenda 10mg  qd and aricept 10mg  qd --- is currently following with neurology  Hypothyroidism: Patient presents for evaluation of thyroid function. Currently no symptoms.The problem has been unchanged.  Previous thyroid studies include TSH. The hypothyroidism is due to hypothyroidism.she is currently on levothyroxine 34mcg qd  Pt for follow up for diabetes - states glucose  ranges are good in the mornings between 130-150 - family did decrease her lantus down to 25 units at bedtime because her sugars had started dropping - due for labwork today Pt not able to get ua  Past Medical History:  Diagnosis Date  . Angina pectoris ([place])   . Diabetes mellitus without complication ([place])   . GERD (gastroesophageal reflux disease)   . Hyperlipidemia   . Hypertension   . Thyroid disease     Past Surgical History:  Procedure Laterality Date  . CARDIAC CATHETERIZATION      Family History  Problem Relation Age of Onset  . Diabetes Mother   . Cancer Mother   . Heart disease Mother   . Heart disease Father   . Alcohol abuse Father     Social History   Socioeconomic History  . Marital status: Widowed    Spouse name: Not on file  . Number of children: Not on file  . Years of education: Not on file  . Highest education level: Not on file  Occupational History  . Occupation: retired  Tobacco Use  . Smoking status: Never Smoker  . Smokeless tobacco: Current User    Types: Snuff  Substance and Sexual Activity  . Alcohol use: No  . Drug use: No  . Sexual activity: Not on file  Other Topics Concern  . Not on file  Social History Narrative  . Not on file   Social Determinants of Health   Financial Resource Strain:   . Difficulty of Paying Living Expenses:   Food Insecurity:   . Worried About Charity fundraiser in the Last Year:   . [place] in the  Last Year:   Transportation Needs:   . Film/video editor (Medical):   Marland Kitchen Lack of Transportation (Non-Medical):   Physical Activity:   . Days of Exercise per Week:   . Minutes of Exercise per Session:   Stress:   . Feeling of Stress :   Social Connections:   . Frequency of Communication with Friends and Family:   . Frequency of Social Gatherings with Friends and Family:   . Attends Religious Services:   . Active Member of Clubs or Organizations:   . Attends Archivist Meetings:    Marland Kitchen Marital Status:   Intimate Partner Violence:   . Fear of Current or Ex-Partner:   . Emotionally Abused:   Marland Kitchen Physically Abused:   . Sexually Abused:      Current Outpatient Medications:  .  Accu-Chek FastClix Lancets MISC, , Disp: , Rfl:  .  aspirin 81 MG chewable tablet, Chew 1 tablet by mouth daily., Disp: , Rfl:  .  atorvastatin (LIPITOR) 80 MG tablet, Take 1 tablet (80 mg total) by mouth daily. PLEASE CALL OFFICE TO SCHEDULE APPOINTMENT FOR FURTHER REFILLS, Disp: 90 tablet, Rfl: 3 .  BD PEN NEEDLE NANO 2ND GEN 32G X 4 MM MISC, , Disp: , Rfl:  .  Cholecalciferol (VITAMIN D3) 1000 units CAPS, Take 1 capsule by mouth daily., Disp: , Rfl:  .  donepezil (ARICEPT) 10 MG tablet, , Disp: , Rfl:  .  glucose blood (ACCU-CHEK GUIDE) test strip, To check glucose tid, Disp: 300 each, Rfl: 3 .  insulin glargine (LANTUS SOLOSTAR) 100 UNIT/ML Solostar Pen, ADMINISTER 25 UNITS UNDER THE SKIN EVERY NIGHT AT BEDTIME, Disp: 15 pen, Rfl: 3 .  levothyroxine (SYNTHROID, LEVOTHROID) 50 MCG tablet, Take 1 tablet by mouth daily., Disp: , Rfl:  .  lisinopril (ZESTRIL) 10 MG tablet, TAKE 1 TABLET(10 MG) BY MOUTH EVERY DAY, Disp: 90 tablet, Rfl: 3 .  memantine (NAMENDA) 10 MG tablet, Take 10 mg by mouth 2 (two) times daily., Disp: , Rfl:  .  metoprolol tartrate (LOPRESSOR) 50 MG tablet, Take 1 tablet (50 mg total) by mouth 2 (two) times daily., Disp: 180 tablet, Rfl: 3 .  montelukast (SINGULAIR) 10 MG tablet, Take 1 tablet (10 mg total) by mouth daily., Disp: 90 tablet, Rfl: 3 .  Multiple Vitamin (MULTI-VITAMIN) tablet, Take by mouth., Disp: , Rfl:  .  nitrofurantoin, macrocrystal-monohydrate, (MACROBID) 100 MG capsule, Take 1 capsule (100 mg total) by mouth daily., Disp: 30 capsule, Rfl: 3 .  Omega-3 1000 MG CAPS, Take by mouth., Disp: , Rfl:  .  pantoprazole (PROTONIX) 40 MG tablet, Take 1 tablet (40 mg total) by mouth daily., Disp: 90 tablet, Rfl: 1 .  Potassium 99 MG TABS, Take 1 tablet by mouth daily.,  Disp: , Rfl:  .  vitamin B-12 (CYANOCOBALAMIN) 1000 MCG tablet, Take 1 tablet by mouth daily., Disp: , Rfl:    Allergies  Allergen Reactions  . Pravastatin     ANGER AND MEMORY ISSUES  . Fenofibrate Rash  . Oxytetracycline Rash  . Penicillins Rash  . Septra [Sulfamethoxazole-Trimethoprim] Rash  . Sulfa Antibiotics Rash  . Tetracyclines & Related Rash    ROS CONSTITUTIONAL: Negative for chills, fatigue, fever, unintentional weight gain and unintentional weight loss.  E/N/T: Negative for ear pain, nasal congestion and sore throat.  CARDIOVASCULAR: Negative for chest pain, dizziness, palpitations and pedal edema.  RESPIRATORY: Negative for recent cough and dyspnea.  GASTROINTESTINAL: Negative for abdominal pain, acid reflux symptoms, constipation, diarrhea,  nausea and vomiting.  MSK: Negative for arthralgias and myalgias.  INTEGUMENTARY: Negative for rash.  NEUROLOGICAL: Negative for dizziness and headaches.  PSYCHIATRIC: Negative for sleep disturbance and to question depression screen.  Negative for depression, negative for anhedonia.        Objective:    PHYSICAL EXAM:   VS: BP (!) 154/70   Pulse 62   Temp (!) 97.4 F (36.3 C)   Resp 16   Wt 114 lb (51.7 kg)   SpO2 98%   BMI 20.19 kg/m   GEN: Well nourished, well developed, in no acute distress   Cardiac: RRR; no murmurs, rubs, or gallops,no edema - no significant varicosities Respiratory:  normal respiratory rate and pattern with no distress - normal breath sounds with no rales, rhonchi, wheezes or rubs GI: normal bowel sounds, no masses or tenderness  Skin: warm and dry, no rash  Neuro:  Alert and Oriented x 3, Strength and sensation are intact - CN II-Xii grossly intact Psych: euthymic mood, appropriate affect and demeanor  BP (!) 154/70   Pulse 62   Temp (!) 97.4 F (36.3 C)   Resp 16   Wt 114 lb (51.7 kg)   SpO2 98%   BMI 20.19 kg/m  Wt Readings from Last 3 Encounters:  03/05/20 114 lb (51.7 kg)   12/06/19 113 lb (51.3 kg)  08/25/19 117 lb 9.6 oz (53.3 kg)     Health Maintenance Due  Topic Date Due  . Hepatitis C Screening  Never done  . FOOT EXAM  Never done  . OPHTHALMOLOGY EXAM  Never done  . COVID-19 Vaccine (1) Never done  . TETANUS/TDAP  Never done  . DEXA SCAN  Never done  . PNA vac Low Risk Adult (2 of 2 - PCV13) 02/10/2011    There are no preventive care reminders to display for this patient.  Lab Results  Component Value Date   TSH 1.860 12/06/2019   Lab Results  Component Value Date   WBC 7.6 12/06/2019   HGB 13.2 12/06/2019   HCT 39.6 12/06/2019   MCV 93 12/06/2019   PLT 304 12/06/2019   Lab Results  Component Value Date   NA 145 (H) 12/06/2019   K 4.2 12/06/2019   CO2 24 12/06/2019   GLUCOSE 83 12/06/2019   BUN 16 12/06/2019   CREATININE 0.98 12/06/2019   BILITOT 1.6 (H) 12/06/2019   ALKPHOS 104 12/06/2019   AST 22 12/06/2019   ALT 27 12/06/2019   PROT 6.6 12/06/2019   ALBUMIN 4.7 12/06/2019   CALCIUM 9.7 12/06/2019   Lab Results  Component Value Date   CHOL 116 12/06/2019   Lab Results  Component Value Date   HDL 41 12/06/2019   Lab Results  Component Value Date   LDLCALC 47 12/06/2019   Lab Results  Component Value Date   TRIG 168 (H) 12/06/2019   Lab Results  Component Value Date   CHOLHDL 2.8 12/06/2019   Lab Results  Component Value Date   HGBA1C 7.5 (H) 12/06/2019      Assessment & Plan:   Problem List Items Addressed This Visit      Cardiovascular and Mediastinum   Coronary artery disease involving native coronary artery of native heart    Continue current meds and follow up with cardiology as directed      Relevant Medications   atorvastatin (LIPITOR) 80 MG tablet   lisinopril (ZESTRIL) 10 MG tablet   metoprolol tartrate (LOPRESSOR) 50 MG tablet  Essential hypertension, benign    Well controlled.  No changes to medicines.  Continue to work on eating a healthy diet and exercise.  Labs drawn today.         Relevant Medications   atorvastatin (LIPITOR) 80 MG tablet   lisinopril (ZESTRIL) 10 MG tablet   metoprolol tartrate (LOPRESSOR) 50 MG tablet     Endocrine   Acquired hypothyroidism    Continue current med - labwork pending      Relevant Medications   metoprolol tartrate (LOPRESSOR) 50 MG tablet   Other Relevant Orders   TSH   Insulin dependent type 2 diabetes mellitus, controlled ([place]) - Primary    Well controlled.  No changes to medicines.  Continue to work on eating a healthy diet and exercise.  Labs drawn today.        Relevant Medications   atorvastatin (LIPITOR) 80 MG tablet   insulin glargine (LANTUS SOLOSTAR) 100 UNIT/ML Solostar Pen   lisinopril (ZESTRIL) 10 MG tablet   Other Relevant Orders   Hemoglobin A1c     Nervous and Auditory   Dementia without behavioral disturbance ([facility])    Continue current meds and follow up with neurology as directed        Other   Vitamin D deficiency    Continue current meds - labwork pending      Relevant Orders   VITAMIN D 25 Hydroxy (Vit-D Deficiency, Fractures)   Mixed hyperlipidemia    Well controlled.  No changes to medicines.  Continue to work on eating a healthy diet and exercise.  Labs drawn today.        Relevant Medications   atorvastatin (LIPITOR) 80 MG tablet   lisinopril (ZESTRIL) 10 MG tablet   metoprolol tartrate (LOPRESSOR) 50 MG tablet   Other Relevant Orders   CBC with Differential/Platelet   Comprehensive metabolic panel   Lipid panel      Meds ordered this encounter  Medications  . DISCONTD: nitrofurantoin, macrocrystal-monohydrate, (MACROBID) 100 MG capsule    Sig: Take 1 capsule (100 mg total) by mouth daily.    Dispense:  30 capsule    Refill:  1    Order Specific Question:   Supervising Provider    AnswerRochel Brome [id number]  . atorvastatin (LIPITOR) 80 MG tablet    Sig: Take 1 tablet (80 mg total) by mouth daily. PLEASE CALL OFFICE TO SCHEDULE APPOINTMENT FOR FURTHER  REFILLS    Dispense:  90 tablet    Refill:  3    Order Specific Question:   Supervising Provider    AnswerRochel Brome [id number]  . insulin glargine (LANTUS SOLOSTAR) 100 UNIT/ML Solostar Pen    Sig: ADMINISTER 25 UNITS UNDER THE SKIN EVERY NIGHT AT BEDTIME    Dispense:  15 pen    Refill:  3    Order Specific Question:   Supervising Provider    AnswerShelton Silvas  . lisinopril (ZESTRIL) 10 MG tablet    Sig: TAKE 1 TABLET(10 MG) BY MOUTH EVERY DAY    Dispense:  90 tablet    Refill:  3    Order Specific Question:   Supervising Provider    AnswerRochel Brome [id number]  . metoprolol tartrate (LOPRESSOR) 50 MG tablet    Sig: Take 1 tablet (50 mg total) by mouth 2 (two) times daily.    Dispense:  180 tablet    Refill:  3    Order Specific  Question:   Supervising Provider    AnswerRochel Brome [id number]  . montelukast (SINGULAIR) 10 MG tablet    Sig: Take 1 tablet (10 mg total) by mouth daily.    Dispense:  90 tablet    Refill:  3    Order Specific Question:   Supervising Provider    AnswerRochel Brome [id number]  . nitrofurantoin, macrocrystal-monohydrate, (MACROBID) 100 MG capsule    Sig: Take 1 capsule (100 mg total) by mouth daily.    Dispense:  30 capsule    Refill:  3    Order Specific Question:   Supervising Provider    Answer:   Shelton Silvas    Follow-up: Return in about 3 months (around 06/05/2020) for chronic fasting follow up.    SARA R Ladona Rosten, PA-C

## 2020-03-05 NOTE — Assessment & Plan Note (Signed)
Continue current meds and follow up with cardiology as directed

## 2020-03-05 NOTE — Assessment & Plan Note (Signed)
Continue current meds labwork pending 

## 2020-03-06 ENCOUNTER — Other Ambulatory Visit: Payer: Self-pay | Admitting: Physician Assistant

## 2020-03-06 LAB — CBC WITH DIFFERENTIAL/PLATELET
Basophils Absolute: 0.1 10*3/uL (ref 0.0–0.2)
Basos: 1 %
EOS (ABSOLUTE): 0.1 10*3/uL (ref 0.0–0.4)
Eos: 2 %
Hematocrit: 35.3 % (ref 34.0–46.6)
Hemoglobin: 11.6 g/dL (ref 11.1–15.9)
Immature Grans (Abs): 0 10*3/uL (ref 0.0–0.1)
Immature Granulocytes: 0 %
Lymphocytes Absolute: 1.7 10*3/uL (ref 0.7–3.1)
Lymphs: 22 %
MCH: 32.6 pg (ref 26.6–33.0)
MCHC: 32.9 g/dL (ref 31.5–35.7)
MCV: 99 fL — ABNORMAL HIGH (ref 79–97)
Monocytes Absolute: 0.8 10*3/uL (ref 0.1–0.9)
Monocytes: 11 %
Neutrophils Absolute: 5 10*3/uL (ref 1.4–7.0)
Neutrophils: 64 %
Platelets: 319 10*3/uL (ref 150–450)
RBC: 3.56 x10E6/uL — ABNORMAL LOW (ref 3.77–5.28)
RDW: 14.3 % (ref 11.7–15.4)
WBC: 7.7 10*3/uL (ref 3.4–10.8)

## 2020-03-06 LAB — LIPID PANEL
Chol/HDL Ratio: 2.7 ratio (ref 0.0–4.4)
Cholesterol, Total: 112 mg/dL (ref 100–199)
HDL: 41 mg/dL (ref >39)
LDL Chol Calc (NIH): 49 mg/dL (ref 0–99)
Triglycerides: 123 mg/dL (ref 0–149)
VLDL Cholesterol Cal: 22 mg/dL (ref 5–40)

## 2020-03-06 LAB — COMPREHENSIVE METABOLIC PANEL
ALT: 20 IU/L (ref 0–32)
AST: 17 IU/L (ref 0–40)
Albumin/Globulin Ratio: 2 (ref 1.2–2.2)
Albumin: 4.4 g/dL (ref 3.7–4.7)
Alkaline Phosphatase: 140 IU/L — ABNORMAL HIGH (ref 39–117)
BUN/Creatinine Ratio: 18 (ref 12–28)
BUN: 14 mg/dL (ref 8–27)
Bilirubin Total: 1.1 mg/dL (ref 0.0–1.2)
CO2: 28 mmol/L (ref 20–29)
Calcium: 9.4 mg/dL (ref 8.7–10.3)
Chloride: 103 mmol/L (ref 96–106)
Creatinine, Ser: 0.77 mg/dL (ref 0.57–1.00)
GFR calc Af Amer: 87 mL/min/{1.73_m2} (ref >59)
GFR calc non Af Amer: 76 mL/min/{1.73_m2} (ref >59)
Globulin, Total: 2.2 g/dL (ref 1.5–4.5)
Glucose: 264 mg/dL — ABNORMAL HIGH (ref 65–99)
Potassium: 4.4 mmol/L (ref 3.5–5.2)
Sodium: 142 mmol/L (ref 134–144)
Total Protein: 6.6 g/dL (ref 6.0–8.5)

## 2020-03-06 LAB — HEMOGLOBIN A1C
Est. average glucose Bld gHb Est-mCnc: 157 mg/dL
Hgb A1c MFr Bld: 7.1 % — ABNORMAL HIGH (ref 4.8–5.6)

## 2020-03-06 LAB — CARDIOVASCULAR RISK ASSESSMENT

## 2020-03-06 LAB — TSH: TSH: 1.97 u[IU]/mL (ref 0.450–4.500)

## 2020-03-06 LAB — VITAMIN D 25 HYDROXY (VIT D DEFICIENCY, FRACTURES): Vit D, 25-Hydroxy: 36.9 ng/mL (ref 30.0–100.0)

## 2020-03-06 MED ORDER — LEVOTHYROXINE SODIUM 50 MCG PO TABS: 50.0000 ug | ORAL_TABLET | Freq: Every day | ORAL | 1 refills | Status: DC

## 2020-04-01 DIAGNOSIS — G309 Alzheimer's disease, unspecified: Secondary | ICD-10-CM | POA: Diagnosis not present

## 2020-04-01 DIAGNOSIS — F0280 Dementia in other diseases classified elsewhere without behavioral disturbance: Secondary | ICD-10-CM | POA: Diagnosis not present

## 2020-05-13 ENCOUNTER — Other Ambulatory Visit: Payer: Self-pay | Admitting: Physician Assistant

## 2020-05-13 DIAGNOSIS — E113413 Type 2 diabetes mellitus with severe nonproliferative diabetic retinopathy with macular edema, bilateral: Secondary | ICD-10-CM | POA: Diagnosis not present

## 2020-05-13 DIAGNOSIS — H3562 Retinal hemorrhage, left eye: Secondary | ICD-10-CM | POA: Diagnosis not present

## 2020-05-13 DIAGNOSIS — Z794 Long term (current) use of insulin: Secondary | ICD-10-CM | POA: Diagnosis not present

## 2020-05-13 DIAGNOSIS — Z961 Presence of intraocular lens: Secondary | ICD-10-CM | POA: Diagnosis not present

## 2020-05-13 DIAGNOSIS — H524 Presbyopia: Secondary | ICD-10-CM | POA: Diagnosis not present

## 2020-05-13 DIAGNOSIS — H209 Unspecified iridocyclitis: Secondary | ICD-10-CM | POA: Diagnosis not present

## 2020-05-13 LAB — HM DIABETES EYE EXAM

## 2020-05-23 ENCOUNTER — Encounter: Payer: Self-pay | Admitting: Physician Assistant

## 2020-05-23 ENCOUNTER — Telehealth (INDEPENDENT_AMBULATORY_CARE_PROVIDER_SITE_OTHER): Payer: Medicare HMO | Admitting: Physician Assistant

## 2020-05-23 VITALS — BP 128/73 | Temp 97.5°F | Ht 63.0 in | Wt 114.0 lb

## 2020-05-23 DIAGNOSIS — J060 Acute laryngopharyngitis: Secondary | ICD-10-CM

## 2020-05-23 HISTORY — DX: Acute laryngopharyngitis: J06.0

## 2020-05-23 MED ORDER — AZITHROMYCIN 250 MG PO TABS: ORAL_TABLET | ORAL | 0 refills | Status: DC

## 2020-05-23 MED ORDER — BENZONATATE 100 MG PO CAPS
100.0000 mg | ORAL_CAPSULE | Freq: Two times a day (BID) | ORAL | 0 refills | Status: DC | PRN
Start: 2020-05-23 — End: 2020-09-05

## 2020-05-23 NOTE — Assessment & Plan Note (Signed)
mucinex as needed rx for zpack as directed

## 2020-05-23 NOTE — Progress Notes (Signed)
Virtual Visit via Telephone Note   This visit type was conducted due to national recommendations for restrictions regarding the COVID-19 Pandemic (e.g. social distancing) in an effort to limit this patient's exposure and mitigate transmission in our community.  Due to her co-morbid illnesses, this patient is at least at moderate risk for complications without adequate follow up.  This format is felt to be most appropriate for this patient at this time.  The patient did not have access to video technology/had technical difficulties with video requiring transitioning to audio format only (telephone).  All issues noted in this document were discussed and addressed.  No physical exam could be performed with this format.  Patient verbally consented to a telehealth visit.   Date:  05/23/2020   ID:  Nancy Lang, [place] 1946/04/11, MRN [id number]  Patient Location: Home Provider Location: Office  PCP:  Nancy Duncans, PA-C    Chief Complaint:  URI  History of Present Illness:    Nancy Lang is a 74 y.o. female with URI and cough Patient's granddaughter gives most of history since pt is poor historian and has dementia.   States pt has had some cough and associated congestion for the past few days - has not had a fever - states she has had a productive cough Denies malaise - appetite is good  The patient does not have symptoms concerning for COVID-19 infection (fever, chills, cough, or new shortness of breath).    Past Medical History:  Diagnosis Date  . Angina pectoris ([place])   . Diabetes mellitus without complication ([place])   . GERD (gastroesophageal reflux disease)   . Hyperlipidemia   . Hypertension   . Thyroid disease    Past Surgical History:  Procedure Laterality Date  . CARDIAC CATHETERIZATION       Current Meds  Medication Sig  . Accu-Chek FastClix Lancets MISC   . aspirin 81 MG chewable tablet Chew 1 tablet by mouth daily.  Marland Kitchen atorvastatin (LIPITOR)  80 MG tablet Take 1 tablet (80 mg total) by mouth daily. PLEASE CALL OFFICE TO SCHEDULE APPOINTMENT FOR FURTHER REFILLS  . BD PEN NEEDLE NANO 2ND GEN 32G X 4 MM MISC   . Cholecalciferol (VITAMIN D3) 1000 units CAPS Take 1 capsule by mouth daily.  . diclofenac (VOLTAREN) 0.1 % ophthalmic solution   . donepezil (ARICEPT) 10 MG tablet   . glucose blood (ACCU-CHEK GUIDE) test strip To check glucose tid  . insulin glargine (LANTUS SOLOSTAR) 100 UNIT/ML Solostar Pen ADMINISTER 25 UNITS UNDER THE SKIN EVERY NIGHT AT BEDTIME  . levothyroxine (SYNTHROID) 50 MCG tablet Take 1 tablet (50 mcg total) by mouth daily.  Marland Kitchen lisinopril (ZESTRIL) 10 MG tablet TAKE 1 TABLET(10 MG) BY MOUTH EVERY DAY  . memantine (NAMENDA) 10 MG tablet Take 10 mg by mouth 2 (two) times daily.  . metoprolol tartrate (LOPRESSOR) 50 MG tablet Take 1 tablet (50 mg total) by mouth 2 (two) times daily.  . montelukast (SINGULAIR) 10 MG tablet Take 1 tablet (10 mg total) by mouth daily.  . Multiple Vitamin (MULTI-VITAMIN) tablet Take by mouth.  . nitrofurantoin, macrocrystal-monohydrate, (MACROBID) 100 MG capsule TAKE 1 CAPSULE EVERY DAY  . Omega-3 1000 MG CAPS Take by mouth.  . pantoprazole (PROTONIX) 40 MG tablet Take 1 tablet (40 mg total) by mouth daily.  . Potassium 99 MG TABS Take 1 tablet by mouth daily.  . prednisoLONE acetate (PRED FORTE) 1 % ophthalmic suspension   . vitamin B-12 (CYANOCOBALAMIN)  1000 MCG tablet Take 1 tablet by mouth daily.     Allergies:   Pravastatin, Fenofibrate, Oxytetracycline, Penicillins, Septra [sulfamethoxazole-trimethoprim], Sulfa antibiotics, and Tetracyclines & related   Social History   Tobacco Use  . Smoking status: Never Smoker  . Smokeless tobacco: Current User    Types: Snuff  Vaping Use  . Vaping Use: Never used  Substance Use Topics  . Alcohol use: No  . Drug use: No     Family Hx: The patient's family history includes Alcohol abuse in her father; Cancer in her mother; Diabetes  in her mother; Heart disease in her father and mother.  ROS:   Please see the history of present illness.    All other systems reviewed and are negative.  Labs/Other Tests and Data Reviewed:    Recent Labs: 03/05/2020: ALT 20; BUN 14; Creatinine, Ser 0.77; Hemoglobin 11.6; Platelets 319; Potassium 4.4; Sodium 142; TSH 1.970   Recent Lipid Panel Lab Results  Component Value Date/Time   CHOL 112 03/05/2020 09:35 AM   TRIG 123 03/05/2020 09:35 AM   HDL 41 03/05/2020 09:35 AM   CHOLHDL 2.7 03/05/2020 09:35 AM   LDLCALC 49 03/05/2020 09:35 AM    Wt Readings from Last 3 Encounters:  05/23/20 114 lb (51.7 kg)  03/05/20 114 lb (51.7 kg)  12/06/19 113 lb (51.3 kg)     Objective:    Vital Signs:  BP 128/73 (BP Location: Left Arm, Patient Position: Sitting)   Temp (!) 97.5 F (36.4 C)   Ht 5\' 3"  (1.6 m)   Wt 114 lb (51.7 kg)   BMI 20.19 kg/m    VITAL SIGNS:  reviewed  ASSESSMENT & PLAN:    1. Uri/cough - rx for zpack as directed and mucinex as directed - follow up if symptoms persist/worsen  COVID-19 Education: The signs and symptoms of COVID-19 were discussed with the patient and how to seek care for testing (follow up with PCP or arrange E-visit). The importance of social distancing was discussed today.  Time:   Today, I have spent 10 minutes with the patient with telehealth technology discussing the above problems.     Medication Adjustments/Labs and Tests Ordered: Current medicines are reviewed at length with the patient today.  Concerns regarding medicines are outlined above.   Tests Ordered: No orders of the defined types were placed in this encounter.   Medication Changes: No orders of the defined types were placed in this encounter.   Follow Up:  In Person prn  Signed, Webb Silversmith, PA-C  05/23/2020 2:32 PM    [place]

## 2020-06-12 ENCOUNTER — Ambulatory Visit (INDEPENDENT_AMBULATORY_CARE_PROVIDER_SITE_OTHER): Payer: Medicare HMO | Admitting: Physician Assistant

## 2020-06-12 ENCOUNTER — Encounter: Payer: Self-pay | Admitting: Physician Assistant

## 2020-06-12 ENCOUNTER — Other Ambulatory Visit: Payer: Self-pay

## 2020-06-12 VITALS — BP 150/80 | HR 73 | Temp 97.3°F | Resp 16 | Ht 62.0 in | Wt 109.2 lb

## 2020-06-12 DIAGNOSIS — I10 Essential (primary) hypertension: Secondary | ICD-10-CM

## 2020-06-12 DIAGNOSIS — Z794 Long term (current) use of insulin: Secondary | ICD-10-CM

## 2020-06-12 DIAGNOSIS — N3000 Acute cystitis without hematuria: Secondary | ICD-10-CM

## 2020-06-12 DIAGNOSIS — F0390 Unspecified dementia without behavioral disturbance: Secondary | ICD-10-CM

## 2020-06-12 DIAGNOSIS — E782 Mixed hyperlipidemia: Secondary | ICD-10-CM

## 2020-06-12 DIAGNOSIS — E039 Hypothyroidism, unspecified: Secondary | ICD-10-CM

## 2020-06-12 DIAGNOSIS — E559 Vitamin D deficiency, unspecified: Secondary | ICD-10-CM | POA: Diagnosis not present

## 2020-06-12 DIAGNOSIS — E119 Type 2 diabetes mellitus without complications: Secondary | ICD-10-CM | POA: Diagnosis not present

## 2020-06-12 DIAGNOSIS — D229 Melanocytic nevi, unspecified: Secondary | ICD-10-CM

## 2020-06-12 HISTORY — DX: Acute cystitis without hematuria: N30.00

## 2020-06-12 HISTORY — DX: Melanocytic nevi, unspecified: D22.9

## 2020-06-12 LAB — POCT UA - MICROALBUMIN: Microalbumin Ur, POC: 150 mg/L

## 2020-06-12 LAB — POCT URINALYSIS DIPSTICK
Bilirubin, UA: NEGATIVE
Blood, UA: NEGATIVE
Glucose, UA: POSITIVE — AB
Ketones, UA: NEGATIVE
Nitrite, UA: NEGATIVE
Protein, UA: POSITIVE — AB
Spec Grav, UA: >=1.03 — AB (ref 1.010–1.025)
Urobilinogen, UA: NEGATIVE E.U./dL — AB
pH, UA: 6 (ref 5.0–8.0)

## 2020-06-12 MED ORDER — CIPROFLOXACIN HCL 250 MG PO TABS
250.0000 mg | ORAL_TABLET | Freq: Two times a day (BID) | ORAL | 0 refills | Status: DC
Start: 2020-06-12 — End: 2020-09-05

## 2020-06-12 NOTE — Progress Notes (Signed)
Established Patient Office Visit  Subjective:  Patient ID: Nancy Lang, female    DOB: 09-Oct-1946  Age: 74 y.o. MRN: [id number]  CC:  Chief Complaint  Patient presents with   Diabetes   Hyperlipidemia   Hypertension   Hypothyroidism    HPI Nancy Lang presents for chronic follow up   Mixed hyperlipidemia  Pt presents with hyperlipidemia. . Compliance with treatment has been good The patient is compliant with medications, maintains a low cholesterol diet , follows up as directed , and maintains an exercise regimen . The patient denies experiencing any hypercholesterolemia related symptoms.  She is currently taking lipitor 80mg  qd  Pt presents for follow up of hypertension.  The patient is tolerating the medication well without side effects. Compliance with treatment has been good; including taking medication as directed , maintains a healthy diet and regular exercise regimen , and following up as directed. Pt currently on zestril 10mg  qd and metoprolol 50mg  qd  Dementia: She is here for follow upt of cognitive problems. She is accompanied by granddaughter and daughter.  Primary caregiver are them. The family and the patient identify problems with changes in short and long term memory. Family and patient report problems with agitation.  Medication administration: family monitors medication usage   Functional Assessment:  Activities of Daily Living (ADLs):   She is independent in the following: feeding Requires assistance with the following: bathing and hygiene, toileting and dressing Currently on Namenda 10mg  qd and aricept 10mg  qd --- is currently following with neurology  Hypothyroidism: Patient presents for evaluation of thyroid function. Currently no symptoms.The problem has been unchanged.  Previous thyroid studies include TSH. 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 - would like dermatology referral   Past Medical History:  Diagnosis Date   Angina pectoris ([place])    Diabetes mellitus without complication ([place])    GERD (gastroesophageal reflux disease)    Hyperlipidemia    Hypertension    Thyroid disease     Past Surgical History:  Procedure Laterality Date   CARDIAC CATHETERIZATION      Family History  Problem Relation Age of Onset   Diabetes Mother    Cancer Mother    Heart disease Mother    Heart disease Father    Alcohol abuse Father     Social History   Socioeconomic History   Marital status: Widowed    Spouse name: Not on file   Number of children: Not on file   Years of education: Not on file   Highest education level: Not on file  Occupational History   Occupation: retired  Tobacco Use   Smoking status: Never Smoker   Smokeless tobacco: Current User    Types: Snuff  Vaping Use   Vaping Use: Never used  Substance and Sexual Activity   Alcohol use: No   Drug use: No   Sexual activity: Not on file  Other Topics Concern   Not  on file  Social History Narrative   Not on file   Social Determinants of Health   Financial Resource Strain:    Difficulty of Paying Living Expenses:   Food Insecurity:    Worried About Charity fundraiser in the Last Year:    Arboriculturist in the Last Year:   Transportation Needs:    Film/video editor (Medical):    Lack of Transportation (Non-Medical):   Physical Activity:    Days of Exercise per Week:    Minutes of Exercise per  Session:   Stress:    Feeling of Stress :   Social Connections:    Frequency of Communication with Friends and Family:    Frequency of Social Gatherings with Friends and Family:    Attends Religious Services:    Active Member of Clubs or Organizations:    Attends Music therapist:    Marital Status:   Intimate Partner Violence:    Fear of Current or Ex-Partner:    Emotionally Abused:    Physically Abused:    Sexually Abused:      Current Outpatient Medications:    Accu-Chek FastClix Lancets MISC, , Disp: , Rfl:    aspirin 81 MG chewable tablet, Chew 1 tablet by mouth daily., Disp: , Rfl:    atorvastatin (LIPITOR) 80 MG tablet, Take 1 tablet (80 mg total) by mouth daily. PLEASE CALL OFFICE TO SCHEDULE APPOINTMENT FOR FURTHER REFILLS, Disp: 90 tablet, Rfl: 3   BD PEN NEEDLE NANO 2ND GEN 32G X 4 MM MISC, , Disp: , Rfl:    benzonatate (TESSALON) 100 MG capsule, Take 1 capsule (100 mg total) by mouth 2 (two) times daily as needed for cough., Disp: 20 capsule, Rfl: 0   Cholecalciferol (VITAMIN D3) 1000 units CAPS, Take 1 capsule by mouth daily., Disp: , Rfl:    diclofenac (VOLTAREN) 0.1 % ophthalmic solution, , Disp: , Rfl:    donepezil (ARICEPT) 10 MG tablet, , Disp: , Rfl:    glucose blood (ACCU-CHEK GUIDE) test strip, To check glucose tid, Disp: 300 each, Rfl: 3   insulin glargine (LANTUS SOLOSTAR) 100 UNIT/ML Solostar Pen, ADMINISTER 25 UNITS UNDER THE SKIN EVERY NIGHT AT BEDTIME, Disp: 15 pen, Rfl: 3   levothyroxine (SYNTHROID) 50 MCG tablet, Take 1 tablet (50 mcg total) by mouth daily., Disp: 90 tablet, Rfl: 1   lisinopril (ZESTRIL) 10 MG tablet, TAKE 1 TABLET(10 MG) BY MOUTH EVERY DAY, Disp: 90 tablet, Rfl: 3   memantine (NAMENDA) 10 MG tablet, Take 10 mg by mouth 2 (two) times daily., Disp: , Rfl:    metoprolol tartrate (LOPRESSOR) 50 MG tablet, Take 1 tablet (50 mg total) by mouth 2 (two) times daily., Disp: 180 tablet, Rfl: 3   montelukast  (SINGULAIR) 10 MG tablet, Take 1 tablet (10 mg total) by mouth daily., Disp: 90 tablet, Rfl: 3   Multiple Vitamin (MULTI-VITAMIN) tablet, Take by mouth., Disp: , Rfl:    nitrofurantoin, macrocrystal-monohydrate, (MACROBID) 100 MG capsule, TAKE 1 CAPSULE EVERY DAY, Disp: 90 capsule, Rfl: 0   Omega-3 1000 MG CAPS, Take by mouth., Disp: , Rfl:    pantoprazole (PROTONIX) 40 MG tablet, Take 1 tablet (40 mg total) by mouth daily., Disp: 90 tablet, Rfl: 1   Potassium 99 MG TABS, Take 1 tablet by mouth daily., Disp: , Rfl:    prednisoLONE acetate (PRED FORTE) 1 % ophthalmic suspension, , Disp: , Rfl:    vitamin B-12 (CYANOCOBALAMIN) 1000 MCG tablet, Take 1  tablet by mouth daily., Disp: , Rfl:    ciprofloxacin (CIPRO) 250 MG tablet, Take 1 tablet (250 mg total) by mouth 2 (two) times daily., Disp: 14 tablet, Rfl: 0   Allergies  Allergen Reactions   Pravastatin     ANGER AND MEMORY ISSUES   Fenofibrate Rash   Oxytetracycline Rash   Penicillins Rash   Septra [Sulfamethoxazole-Trimethoprim] Rash   Sulfa Antibiotics Rash   Tetracyclines & Related Rash    ROS CONSTITUTIONAL: Negative for chills, fatigue, fever, unintentional weight gain and unintentional weight loss.  E/N/T: Negative for ear pain, nasal congestion and sore throat.  CARDIOVASCULAR: Negative for chest pain, dizziness, palpitations and pedal edema.  RESPIRATORY: Negative for recent cough and dyspnea.  GASTROINTESTINAL: Negative for abdominal pain, acid reflux symptoms, constipation, diarrhea, nausea and vomiting.  MSK: Negative for arthralgias and myalgias.  INTEGUMENTARY: see HPI NEUROLOGICAL: Negative for dizziness and headaches.  PSYCHIATRIC: Negative for sleep disturbance and to question depression screen.  Negative for depression, negative for anhedonia.        Objective:    PHYSICAL EXAM:   VS: BP (!) 150/80    Pulse 73    Temp (!) 97.3 F (36.3 C)    Resp 16    Ht 5\' 2"  (1.575 m)    Wt 109 lb 3.2 oz  (49.5 kg)    SpO2 98%    BMI 19.97 kg/m   GEN: Well nourished, well developed, in no acute distress   Cardiac: RRR; no murmurs, rubs, or gallops,no edema - no significant varicosities Respiratory:  normal respiratory rate and pattern with no distress - normal breath sounds with no rales, rhonchi, wheezes or rubs GI: normal bowel sounds, no masses or tenderness  Skin: warm and dry, no rash  Neuro:  Alert and Oriented x 3, Strength and sensation are intact - CN II-Xii grossly intact Psych: euthymic mood, appropriate affect and demeanor   Diabetic Foot Exam - Simple   Simple Foot Form Diabetic Foot exam was performed with the following findings: Yes 06/12/2020  8:58 AM  Visual Inspection No deformities, no ulcerations, no other skin breakdown bilaterally: Yes Sensation Testing Intact to touch and monofilament testing bilaterally: Yes Pulse Check Posterior Tibialis and Dorsalis pulse intact bilaterally: Yes Comments Left foot with thick keratotic nails noted    Office Visit on 06/12/2020  Component Date Value Ref Range Status   Glucose, UA 06/12/2020 Positive* Negative Final   Bilirubin, UA 06/12/2020 Negative   Final   Ketones, UA 06/12/2020 Negative   Final   Spec Grav, UA 06/12/2020 >=1.030* 1.010 - 1.025 Final   Blood, UA 06/12/2020 Negative   Final   pH, UA 06/12/2020 6.0  5.0 - 8.0 Final   Protein, UA 06/12/2020 Positive* Negative Final   Urobilinogen, UA 06/12/2020 negative* 0.2 or 1.0 E.U./dL Final   Nitrite, UA 06/12/2020 negative   Final   Leukocytes, UA 06/12/2020 Moderate (2+)* Negative Final   Microalbumin Ur, POC 06/12/2020 150  mg/L Final    BP (!) 150/80    Pulse 73    Temp (!) 97.3 F (36.3 C)    Resp 16    Ht 5\' 2"  (1.575 m)    Wt 109 lb 3.2 oz (49.5 kg)    SpO2 98%    BMI 19.97 kg/m  Wt Readings from Last 3 Encounters:  06/12/20 109 lb 3.2 oz (49.5 kg)  05/23/20 114 lb (51.7 kg)  03/05/20 114 lb (51.7 kg)     Health Maintenance Due  Topic  Date Due   Hepatitis C Screening  Never done   FOOT EXAM  Never done   OPHTHALMOLOGY EXAM  Never done   COVID-19 Vaccine (1) Never done   DEXA SCAN  Never done   INFLUENZA VACCINE  05/26/2020    There are no preventive care reminders to display for this patient.  Lab Results  Component Value Date   TSH 1.970 03/05/2020   Lab Results  Component Value Date   WBC 7.7 03/05/2020   HGB 11.6 03/05/2020   HCT 35.3 03/05/2020   MCV 99 (H) 03/05/2020   PLT 319 03/05/2020   Lab Results  Component Value Date   NA 142 03/05/2020   K 4.4 03/05/2020   CO2 28 03/05/2020   GLUCOSE 264 (H) 03/05/2020   BUN 14 03/05/2020   CREATININE 0.77 03/05/2020   BILITOT 1.1 03/05/2020   ALKPHOS 140 (H) 03/05/2020   AST 17 03/05/2020   ALT 20 03/05/2020   PROT 6.6 03/05/2020   ALBUMIN 4.4 03/05/2020   CALCIUM 9.4 03/05/2020   Lab Results  Component Value Date   CHOL 112 03/05/2020   Lab Results  Component Value Date   HDL 41 03/05/2020   Lab Results  Component Value Date   LDLCALC 49 03/05/2020   Lab Results  Component Value Date   TRIG 123 03/05/2020   Lab Results  Component Value Date   CHOLHDL 2.7 03/05/2020   Lab Results  Component Value Date   HGBA1C 7.1 (H) 03/05/2020      Assessment & Plan:   Problem List Items Addressed This Visit      Cardiovascular and Mediastinum   Essential hypertension, benign   Relevant Orders   CBC with Differential/Platelet   Comprehensive metabolic panel     Endocrine   Acquired hypothyroidism   Relevant Orders   TSH   Insulin dependent type 2 diabetes mellitus, controlled ([place])   Relevant Orders   Hemoglobin A1c   POCT urinalysis dipstick   POCT UA - Microalbumin   Ambulatory referral to Podiatry   Ambulatory referral to Endocrinology     Nervous and Auditory   Dementia without behavioral disturbance ([place])     Other   Vitamin D deficiency - Primary   Relevant Orders   VITAMIN D 25 Hydroxy (Vit-D Deficiency,  Fractures)   Mixed hyperlipidemia   Relevant Orders   Lipid panel    Other Visit Diagnoses    Atypical nevus       Relevant Orders   Ambulatory referral to Dermatology   Acute cystitis without hematuria       Relevant Orders   Urine Culture      Meds ordered this encounter  Medications   ciprofloxacin (CIPRO) 250 MG tablet    Sig: Take 1 tablet (250 mg total) by mouth 2 (two) times daily.    Dispense:  14 tablet    Refill:  0    Order Specific Question:   Supervising Provider    Answer:   Shelton Silvas    Follow-up: Return in about 3 months (around 09/12/2020) for chronic fasting follow up.    SARA R Arma Reining, PA-C

## 2020-06-12 NOTE — Assessment & Plan Note (Signed)
Well controlled.  ?No changes to medicines.  ?Continue to work on eating a healthy diet and exercise.  ?Labs drawn today.  ?

## 2020-06-13 LAB — CBC WITH DIFFERENTIAL/PLATELET
Basophils Absolute: 0.1 10*3/uL (ref 0.0–0.2)
Basos: 1 %
EOS (ABSOLUTE): 0.2 10*3/uL (ref 0.0–0.4)
Eos: 2 %
Hematocrit: 35.6 % (ref 34.0–46.6)
Hemoglobin: 12 g/dL (ref 11.1–15.9)
Immature Grans (Abs): 0 10*3/uL (ref 0.0–0.1)
Immature Granulocytes: 0 %
Lymphocytes Absolute: 2.3 10*3/uL (ref 0.7–3.1)
Lymphs: 26 %
MCH: 32.3 pg (ref 26.6–33.0)
MCHC: 33.7 g/dL (ref 31.5–35.7)
MCV: 96 fL (ref 79–97)
Monocytes Absolute: 0.9 10*3/uL (ref 0.1–0.9)
Monocytes: 10 %
Neutrophils Absolute: 5.2 10*3/uL (ref 1.4–7.0)
Neutrophils: 61 %
Platelets: 279 10*3/uL (ref 150–450)
RBC: 3.71 x10E6/uL — ABNORMAL LOW (ref 3.77–5.28)
RDW: 14.3 % (ref 11.7–15.4)
WBC: 8.6 10*3/uL (ref 3.4–10.8)

## 2020-06-13 LAB — COMPREHENSIVE METABOLIC PANEL
ALT: 23 IU/L (ref 0–32)
AST: 19 IU/L (ref 0–40)
Albumin/Globulin Ratio: 2 (ref 1.2–2.2)
Albumin: 4.6 g/dL (ref 3.7–4.7)
Alkaline Phosphatase: 149 IU/L — ABNORMAL HIGH (ref 48–121)
BUN/Creatinine Ratio: 19 (ref 12–28)
BUN: 15 mg/dL (ref 8–27)
Bilirubin Total: 1.1 mg/dL (ref 0.0–1.2)
CO2: 26 mmol/L (ref 20–29)
Calcium: 10.1 mg/dL (ref 8.7–10.3)
Chloride: 101 mmol/L (ref 96–106)
Creatinine, Ser: 0.79 mg/dL (ref 0.57–1.00)
GFR calc Af Amer: 85 mL/min/{1.73_m2} (ref >59)
GFR calc non Af Amer: 73 mL/min/{1.73_m2} (ref >59)
Globulin, Total: 2.3 g/dL (ref 1.5–4.5)
Glucose: 178 mg/dL — ABNORMAL HIGH (ref 65–99)
Potassium: 4 mmol/L (ref 3.5–5.2)
Sodium: 141 mmol/L (ref 134–144)
Total Protein: 6.9 g/dL (ref 6.0–8.5)

## 2020-06-13 LAB — LIPID PANEL
Chol/HDL Ratio: 3 ratio (ref 0.0–4.4)
Cholesterol, Total: 125 mg/dL (ref 100–199)
HDL: 42 mg/dL (ref >39)
LDL Chol Calc (NIH): 55 mg/dL (ref 0–99)
Triglycerides: 165 mg/dL — ABNORMAL HIGH (ref 0–149)
VLDL Cholesterol Cal: 28 mg/dL (ref 5–40)

## 2020-06-13 LAB — HEMOGLOBIN A1C
Est. average glucose Bld gHb Est-mCnc: 160 mg/dL
Hgb A1c MFr Bld: 7.2 % — ABNORMAL HIGH (ref 4.8–5.6)

## 2020-06-13 LAB — CARDIOVASCULAR RISK ASSESSMENT

## 2020-06-13 LAB — VITAMIN D 25 HYDROXY (VIT D DEFICIENCY, FRACTURES): Vit D, 25-Hydroxy: 30.4 ng/mL (ref 30.0–100.0)

## 2020-06-13 LAB — TSH: TSH: 2.77 u[IU]/mL (ref 0.450–4.500)

## 2020-06-14 LAB — URINE CULTURE

## 2020-06-27 ENCOUNTER — Ambulatory Visit: Payer: Medicare HMO | Admitting: Podiatry

## 2020-06-27 ENCOUNTER — Other Ambulatory Visit: Payer: Self-pay | Admitting: Physician Assistant

## 2020-07-02 ENCOUNTER — Other Ambulatory Visit: Payer: Self-pay | Admitting: Physician Assistant

## 2020-07-04 DIAGNOSIS — E113413 Type 2 diabetes mellitus with severe nonproliferative diabetic retinopathy with macular edema, bilateral: Secondary | ICD-10-CM | POA: Diagnosis not present

## 2020-07-04 DIAGNOSIS — Z961 Presence of intraocular lens: Secondary | ICD-10-CM | POA: Diagnosis not present

## 2020-07-04 DIAGNOSIS — H209 Unspecified iridocyclitis: Secondary | ICD-10-CM | POA: Diagnosis not present

## 2020-07-04 DIAGNOSIS — Z794 Long term (current) use of insulin: Secondary | ICD-10-CM | POA: Diagnosis not present

## 2020-07-04 DIAGNOSIS — H3562 Retinal hemorrhage, left eye: Secondary | ICD-10-CM | POA: Diagnosis not present

## 2020-07-10 DIAGNOSIS — L821 Other seborrheic keratosis: Secondary | ICD-10-CM | POA: Diagnosis not present

## 2020-07-15 ENCOUNTER — Ambulatory Visit: Payer: Medicare HMO | Admitting: Podiatry

## 2020-07-25 ENCOUNTER — Ambulatory Visit: Payer: Medicare HMO | Admitting: Podiatry

## 2020-07-29 ENCOUNTER — Ambulatory Visit: Payer: Medicare HMO | Admitting: Podiatry

## 2020-08-05 ENCOUNTER — Ambulatory Visit (INDEPENDENT_AMBULATORY_CARE_PROVIDER_SITE_OTHER): Payer: Medicare HMO | Admitting: Podiatry

## 2020-08-05 DIAGNOSIS — Z5329 Procedure and treatment not carried out because of patient's decision for other reasons: Secondary | ICD-10-CM

## 2020-08-05 NOTE — Progress Notes (Signed)
No show for appt. 

## 2020-08-28 ENCOUNTER — Telehealth: Payer: Self-pay | Admitting: Physician Assistant

## 2020-08-28 NOTE — Progress Notes (Signed)
  Chronic Care Management   Outreach Note  08/28/2020 Name: Nancy Lang MRN: [id number] DOB: [birth year]  Referred by: Marge Duncans, PA-C Reason for referral : Chronic Care Management   An unsuccessful telephone outreach was attempted today. The patient was referred to the pharmacist for assistance with care management and care coordination.   Follow Up Plan:   Nancy Lang  Upstream Scheduler

## 2020-08-29 ENCOUNTER — Ambulatory Visit: Payer: Medicare HMO | Admitting: Internal Medicine

## 2020-08-30 ENCOUNTER — Emergency Department (HOSPITAL_COMMUNITY): Payer: Medicare HMO

## 2020-08-30 ENCOUNTER — Emergency Department (HOSPITAL_COMMUNITY)
Admission: EM | Admit: 2020-08-30 | Discharge: 2020-08-30 | Disposition: A | Discharge status: Home / Self Care | Payer: Medicare HMO | Attending: Emergency Medicine | Admitting: Emergency Medicine

## 2020-08-30 ENCOUNTER — Other Ambulatory Visit: Payer: Self-pay

## 2020-08-30 ENCOUNTER — Encounter (HOSPITAL_COMMUNITY): Payer: Self-pay | Admitting: Emergency Medicine

## 2020-08-30 DIAGNOSIS — S42301A Unspecified fracture of shaft of humerus, right arm, initial encounter for closed fracture: Secondary | ICD-10-CM | POA: Diagnosis not present

## 2020-08-30 DIAGNOSIS — W19XXXA Unspecified fall, initial encounter: Secondary | ICD-10-CM | POA: Diagnosis not present

## 2020-08-30 DIAGNOSIS — E039 Hypothyroidism, unspecified: Secondary | ICD-10-CM | POA: Insufficient documentation

## 2020-08-30 DIAGNOSIS — R609 Edema, unspecified: Secondary | ICD-10-CM | POA: Diagnosis not present

## 2020-08-30 DIAGNOSIS — F0390 Unspecified dementia without behavioral disturbance: Secondary | ICD-10-CM | POA: Insufficient documentation

## 2020-08-30 DIAGNOSIS — S4981XA Other specified injuries of right shoulder and upper arm, initial encounter: Secondary | ICD-10-CM | POA: Diagnosis not present

## 2020-08-30 DIAGNOSIS — Z794 Long term (current) use of insulin: Secondary | ICD-10-CM | POA: Insufficient documentation

## 2020-08-30 DIAGNOSIS — I2510 Atherosclerotic heart disease of native coronary artery without angina pectoris: Secondary | ICD-10-CM | POA: Insufficient documentation

## 2020-08-30 DIAGNOSIS — E119 Type 2 diabetes mellitus without complications: Secondary | ICD-10-CM | POA: Diagnosis not present

## 2020-08-30 DIAGNOSIS — W06XXXA Fall from bed, initial encounter: Secondary | ICD-10-CM | POA: Diagnosis not present

## 2020-08-30 DIAGNOSIS — Z79899 Other long term (current) drug therapy: Secondary | ICD-10-CM | POA: Insufficient documentation

## 2020-08-30 DIAGNOSIS — I119 Hypertensive heart disease without heart failure: Secondary | ICD-10-CM | POA: Insufficient documentation

## 2020-08-30 DIAGNOSIS — S42211A Unspecified displaced fracture of surgical neck of right humerus, initial encounter for closed fracture: Secondary | ICD-10-CM | POA: Diagnosis not present

## 2020-08-30 DIAGNOSIS — Y92009 Unspecified place in unspecified non-institutional (private) residence as the place of occurrence of the external cause: Secondary | ICD-10-CM | POA: Diagnosis not present

## 2020-08-30 DIAGNOSIS — M25511 Pain in right shoulder: Secondary | ICD-10-CM | POA: Diagnosis not present

## 2020-08-30 DIAGNOSIS — Z043 Encounter for examination and observation following other accident: Secondary | ICD-10-CM | POA: Diagnosis not present

## 2020-08-30 DIAGNOSIS — S42291A Other displaced fracture of upper end of right humerus, initial encounter for closed fracture: Secondary | ICD-10-CM | POA: Diagnosis not present

## 2020-08-30 DIAGNOSIS — Z7982 Long term (current) use of aspirin: Secondary | ICD-10-CM | POA: Diagnosis not present

## 2020-08-30 DIAGNOSIS — S4991XA Unspecified injury of right shoulder and upper arm, initial encounter: Secondary | ICD-10-CM | POA: Diagnosis present

## 2020-08-30 DIAGNOSIS — I10 Essential (primary) hypertension: Secondary | ICD-10-CM | POA: Diagnosis not present

## 2020-08-30 DIAGNOSIS — S0990XA Unspecified injury of head, initial encounter: Secondary | ICD-10-CM | POA: Diagnosis not present

## 2020-08-30 DIAGNOSIS — R52 Pain, unspecified: Secondary | ICD-10-CM | POA: Diagnosis not present

## 2020-08-30 MED ORDER — FENTANYL CITRATE (PF) 100 MCG/2ML IJ SOLN
50.0000 ug | Freq: Once | INTRAMUSCULAR | Status: AC
  Administered 2020-08-30: 50 ug via INTRAVENOUS
  Filled 2020-08-30: qty 2

## 2020-08-30 MED ORDER — HYDROCODONE-ACETAMINOPHEN 5-325 MG PO TABS
1.0000 | ORAL_TABLET | ORAL | 0 refills | Status: AC | PRN
Start: 2020-08-30 — End: 2020-09-04

## 2020-08-30 MED ORDER — HYDROCODONE-ACETAMINOPHEN 5-325 MG PO TABS
1.0000 | ORAL_TABLET | ORAL | 0 refills | Status: DC | PRN
Start: 2020-08-30 — End: 2020-08-30

## 2020-08-30 NOTE — ED Triage Notes (Signed)
Pt BIB [facility] from home. Pt was attempting to get out of bed. Pt fell onto her right side. Possible shoulder dislocation. Pt 10/10 pain upon EMS arrival. Pt given 100 mcg fentanyl via EMS. 0/10 pain upon arrival to department. Pt with history of dementia. VSS. NAD.

## 2020-08-30 NOTE — Discharge Instructions (Addendum)
Please keep arm in the sling at all times.  You can give the prescribed medication, Norco, every 4 hours as needed for pain.  The Norco has Tylenol in it, so avoid giving more than 3 extra Tylenol tablets daily.  Please follow-up with your primary care doctor.  Dr. Stann Mainland will be the orthopedist who will follow you up in clinic.  Please return to the ED for any new symptoms or uncontrolled pain.

## 2020-08-30 NOTE — ED Provider Notes (Signed)
[facility] EMERGENCY DEPARTMENT Provider Note   CSN: [id number] Arrival date & time: 08/30/20  1807     History Chief Complaint  Patient presents with   Fall   Shoulder Pain    Nancy Lang is a 74 y.o. female.  Per report, patient has dementia and likes to get out of bed without assistance even though she should have assistance.  She tried to get out of bed today and fell onto her right side.  It is unknown if she hit her head or had loss of consciousness.  She had sudden onset right shoulder pain and deformity at the time, so she was sent in for further evaluation.  The history is provided by the EMS personnel.  Shoulder Injury This is a new problem. The current episode started 1 to 2 hours ago. The problem occurs constantly. The problem has not changed since onset.Nothing aggravates the symptoms. Nothing relieves the symptoms. Treatments tried: immobilization. The treatment provided mild relief.       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   Dementia without behavioral disturbance ([place]) 03/10/2017   Memory impairment 03/10/2017   Vitamin D deficiency 03/08/2017   Coronary artery disease involving native coronary artery of native heart 10/07/2016   Dyslipidemia (high LDL; low HDL) 10/07/2016   Essential hypertension, benign 10/07/2016   Insulin dependent type 2 diabetes mellitus, 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     Family History  Problem Relation Age of Onset   Diabetes Mother    Cancer Mother    Heart disease Mother    Heart disease Father    Alcohol abuse Father     Social History   Tobacco Use   Smoking status: Never Smoker   Smokeless tobacco: Current User    Types: Snuff  Vaping Use   Vaping Use: Never used  Substance Use Topics   Alcohol use: No   Drug use: No    Home Medications Prior to Admission medications   Medication Sig Start Date End Date Taking? Authorizing Provider  ascorbic acid (VITAMIN C) 500 MG tablet Take 500 mg by mouth daily.   Yes [provider]  aspirin 81 MG chewable tablet Chew 81 mg by mouth daily.  09/09/16  Yes [provider]  atorvastatin (LIPITOR) 80 MG tablet Take 1 tablet (80 mg total) by mouth daily. PLEASE CALL OFFICE TO SCHEDULE APPOINTMENT FOR FURTHER REFILLS Patient taking differently: Take 80 mg by mouth at bedtime.  03/05/20  Yes Marge Duncans, PA-C  Calcium-Magnesium-Zinc (CAL-MAG-ZINC PO) Take 1 tablet by mouth daily with breakfast.   Yes [provider]  Cholecalciferol (VITAMIN D3) 1000 units CAPS Take 1,000 Units by mouth daily.    Yes [provider]  donepezil (ARICEPT) 10 MG tablet Take  10 mg by mouth at bedtime.  09/13/19  Yes [provider]  insulin glargine (LANTUS SOLOSTAR) 100 UNIT/ML Solostar Pen ADMINISTER 25 UNITS UNDER THE SKIN EVERY NIGHT AT BEDTIME Patient taking differently: Inject 25 Units into the skin at bedtime.  03/05/20  Yes Marge Duncans, PA-C  levothyroxine (SYNTHROID) 50 MCG tablet TAKE 1 TABLET EVERY DAY Patient taking differently: Take 50 mcg by mouth daily before breakfast.  07/02/20  Yes Marge Duncans, PA-C  lisinopril (ZESTRIL) 10 MG tablet TAKE 1 TABLET(10 MG) BY MOUTH EVERY DAY Patient  taking differently: Take 10 mg by mouth daily.  03/05/20  Yes Marge Duncans, PA-C  memantine (NAMENDA) 10 MG tablet Take 10 mg by mouth 2 (two) times daily. 08/31/16  Yes [provider]  metoprolol tartrate (LOPRESSOR) 50 MG tablet Take 1 tablet (50 mg total) by mouth 2 (two) times daily. Patient taking differently: Take 50 mg by mouth in the morning and at bedtime.  03/05/20  Yes Marge Duncans, PA-C  montelukast (SINGULAIR) 10 MG tablet Take 1 tablet (10 mg total) by mouth daily. Patient taking differently: Take 10 mg by mouth at bedtime.  03/05/20  Yes Marge Duncans, PA-C  Multiple Vitamins-Minerals (ONE-A-DAY PROACTIVE 65+) TABS Take 1 tablet by mouth daily with breakfast.   Yes [provider]  nitrofurantoin, macrocrystal-monohydrate, (MACROBID) 100 MG capsule TAKE 1 CAPSULE EVERY DAY Patient taking differently: Take 100 mg by mouth daily.  07/02/20  Yes Marge Duncans, PA-C  Omega-3 1000 MG CAPS Take 1,000 mg by mouth daily.    Yes [provider]  pantoprazole (PROTONIX) 40 MG tablet TAKE 1 TABLET (40 MG TOTAL) BY MOUTH DAILY. Patient taking differently: Take 40 mg by mouth daily before breakfast.  06/27/20  Yes Marge Duncans, PA-C  potassium gluconate 595 (99 K) MG TABS tablet Take 595 mg by mouth daily.   Yes [provider]  vitamin B-12 (CYANOCOBALAMIN) 1000 MCG tablet Take 1,000 mcg by mouth daily.    Yes [provider]  Accu-Chek FastClix Lancets MISC  11/21/19   [provider]  BD PEN NEEDLE NANO 2ND GEN 32G X 4 MM MISC  11/21/19   [provider]  benzonatate (TESSALON) 100 MG capsule Take 1 capsule (100 mg total) by mouth 2 (two) times daily as needed for cough. Patient not taking: Reported on 08/30/2020 05/23/20   Marge Duncans, PA-C  ciprofloxacin (CIPRO) 250 MG tablet Take 1 tablet (250 mg total) by mouth 2 (two) times daily. Patient not taking: Reported on 08/30/2020 06/12/20   Marge Duncans, PA-C  glucose blood (ACCU-CHEK GUIDE) test strip  To check glucose tid 12/19/19   Marge Duncans, PA-C  HYDROcodone-acetaminophen (NORCO/VICODIN) 5-325 MG tablet Take 1 tablet by mouth every 4 (four) hours as needed for up to 5 days. 08/30/20 09/04/20  Launa Flight, MD    Allergies    Pravastatin, Ativan [lorazepam], Fenofibrate, Oxytetracycline, Penicillins, Septra [sulfamethoxazole-trimethoprim], Sulfa antibiotics, and Tetracyclines & related  Review of Systems   Review of Systems  Unable to perform ROS: Dementia    Physical Exam Updated Vital Signs BP (!) 218/68    Pulse 66    Temp 98.5 F (36.9 C) (Oral)    Resp 15    SpO2 100%   Physical Exam Vitals and nursing note reviewed.  Constitutional:      Appearance: She is well-developed. She is not ill-appearing, toxic-appearing or diaphoretic.  HENT:     Head: Normocephalic and atraumatic.     Mouth/Throat:  Mouth: Mucous membranes are dry.     Pharynx: Oropharynx is clear.  Eyes:     Conjunctiva/sclera: Conjunctivae normal.  Cardiovascular:     Rate and Rhythm: Normal rate and regular rhythm.     Heart sounds: No murmur heard.  No gallop.   Pulmonary:     Effort: Pulmonary effort is normal. No respiratory distress.     Breath sounds: Normal breath sounds.  Abdominal:     Palpations: Abdomen is soft.     Tenderness: There is no abdominal tenderness.  Musculoskeletal:     Cervical back: Neck supple. No deformity or tenderness.     Thoracic back: No deformity or tenderness.     Lumbar back: No deformity or tenderness.     Comments: BLE atraumatic and nontender.  LUE atraumatic and nontender.  RUE with deformity to the right shoulder with exquisite tenderness to palpation and markedly decreased range of motion, distally neurovascularly intact with intact sensation, able to wiggle fingers, good distal pulse with brisk capillary refill.  Skin:    General: Skin is warm and dry.  Neurological:     Mental Status: She is alert. Mental status is at baseline.     Comments:  PERRL.  Hard of hearing bilaterally.  Speech is clear.  Moves all extremities spontaneously but with restriction in the right upper extremity.  Response to stimuli in all extremities.     ED Results / Procedures / Treatments   Labs (all labs ordered are listed, but only abnormal results are displayed) Labs Reviewed - No data to display  EKG EKG Interpretation  Date/Time:  Friday August 30 2020 20:29:44 EDT Ventricular Rate:  64 PR Interval:    QRS Duration: 145 QT Interval:  510 QTC Calculation: 527 R Axis:   -59 Text Interpretation: Sinus rhythm Left bundle branch block no significant change since earlier in the day Confirmed by Sherwood Gambler [phone]) on 08/30/2020 8:30:51 PM   Radiology CT Head Wo Contrast  Result Date: 08/30/2020 CLINICAL DATA:  Fall out of bed.  Head trauma EXAM: CT HEAD WITHOUT CONTRAST TECHNIQUE: Contiguous axial images were obtained from the base of the skull through the vertex without intravenous contrast. COMPARISON:  None. FINDINGS: Brain: Pole right parietal infarct. There is atrophy and chronic small vessel disease changes. No acute intracranial abnormality. Specifically, no hemorrhage, hydrocephalus, mass lesion, acute infarction, or significant intracranial injury. Vascular: No hyperdense vessel or unexpected calcification. Skull: No acute calvarial abnormality. Sinuses/Orbits: Visualized paranasal sinuses and mastoids clear. Orbital soft tissues unremarkable. 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 Atrophy, chronic microvascular disease. No acute intracranial abnormality. Electronically Signed   By: Rolm Baptise M.D.   On: 08/30/2020 20:04   CT Cervical Spine Wo Contrast  Result Date: 08/30/2020 CLINICAL DATA:  Fall out of bed EXAM: CT CERVICAL SPINE WITHOUT CONTRAST TECHNIQUE: Multidetector CT imaging of the cervical spine was performed without intravenous contrast. Multiplanar CT image reconstructions were also generated.  COMPARISON:  None. FINDINGS: Alignment: Normal alignment. Skull base and vertebrae: No acute fracture. No primary bone lesion or focal pathologic process. Soft tissues and spinal canal: No prevertebral fluid or swelling. No visible canal hematoma. Disc levels: Early anterior spurring. Disc spaces maintained. Early degenerative facet disease bilaterally. Upper chest: No acute findings Other: None IMPRESSION: No acute bony abnormality. Electronically Signed   By: Rolm Baptise M.D.   On: 08/30/2020 20:01   DG Pelvis Portable  Result Date: 08/30/2020 CLINICAL DATA:  Fall onto right side getting  out of bed. EXAM: PORTABLE PELVIS 1-2 VIEWS COMPARISON:  None. FINDINGS: Cortical margins of the pelvis are intact. Right hip arthroplasty is intact were visualized. There is no periprosthetic fracture. Distal most femoral stem not entirely included in the field of view. The pubic rami are intact. Pubic symphysis and sacroiliac joints are congruent. Mild left hip degenerative change. The bones are under mineralized. IMPRESSION: No acute pelvic fracture. Intact right hip arthroplasty. Electronically Signed   By: Keith Rake M.D.   On: 08/30/2020 19:28   DG Chest Portable 1 View  Result Date: 08/30/2020 CLINICAL DATA:  Fall onto right side EXAM: PORTABLE CHEST 1 VIEW COMPARISON:  None. FINDINGS: Lung volumes are low. Heart is normal in size for technique. Normal mediastinal contours with aortic atherosclerosis. No pneumothorax or pleural effusion. No focal airspace disease. Displaced transverse fracture through the right proximal humerus. No evidence of rib fracture. Mild scoliotic curvature of the spine. IMPRESSION: 1. Low lung volumes without acute cardiopulmonary process. 2. Displaced right proximal humerus fracture. Electronically Signed   By: Keith Rake M.D.   On: 08/30/2020 19:35   DG Shoulder Right Portable  Result Date: 08/30/2020 CLINICAL DATA:  Fall with deformity and right shoulder pain. EXAM:  PORTABLE RIGHT SHOULDER COMPARISON:  None. FINDINGS: Displaced fracture of the right proximal humerus primarily involving the surgical neck. Fracture is displaced by at least 6 mm. No obvious dislocation on this portable AP view. The acromioclavicular joint is congruent. IMPRESSION: Displaced right proximal humerus fracture primarily involving the surgical neck. Electronically Signed   By: Keith Rake M.D.   On: 08/30/2020 19:31    Procedures Procedures (including critical care time)  Medications Ordered in ED Medications  fentaNYL (SUBLIMAZE) injection 50 mcg (50 mcg Intravenous Given 08/30/20 1836)  fentaNYL (SUBLIMAZE) injection 50 mcg (50 mcg Intravenous Given 08/30/20 2021)    ED Course  I have reviewed the triage vital signs and the nursing notes.  Pertinent labs & imaging results that were available during my care of the patient were reviewed by me and considered in my medical decision making (see chart for details).    MDM Rules/Calculators/A&P                          The patient is a 74yo female, PMH dementia, hard of hearing, who presents to the ED via EMS for fall with right shoulder pain.  On my initial evaluation, the patient is hemodynamically stable, afebrile, nontoxic-appearing. Physical exam remarkable for right shoulder deformity with tenderness and decreased range of motion.  Differentials considered include right proximal humerus fracture, shoulder dislocation, ICH, C-spine injury, rib fracture. Patient provided IV fentanyl for pain.  EKG with left bundle branch block and T wave inversions in 1, aVL, V6, which are unchanged from prior EKG.  CT head and C-spine unremarkable as interpreted by radiology and myself.  X-rays remarkable for right proximal humerus fracture that is displaced, as interpreted by radiology and myself.  Consulted orthopedics, who recommended nonweightbearing and sling use, and orthopedics reported they would follow her up in clinic.  On  reevaluation, patient burping frequently.  Granddaughter at bedside reports that the patient burps relatively frequently due to her acid reflux and has not had her nightly reflux, but the granddaughter is concerned because with the patient's 2 prior heart attacks, she had a lot of belching then.  Repeat EKG unchanged from first EKG, very low suspicion for active ACS in this stable patient with  no chest pain.  Suspect belching from not having her nightly reflux medication, from her pain from her traumatic injury, and from pain meds.  Advised patient and family member of concern for right proximal humerus fracture. Advised treatment of symptoms with nonweightbearing as tolerated, placed a sling, prescribed Norco after reviewing PDMP, and provided orthopedic follow-up information. Recommended follow-up with PCP in the next couple days. Strict return precautions provided.  Family member in agreement with plan.  Patient discharged in stable condition.   The care of this patient was overseen by Dr. Regenia Skeeter, who agreed with evaluation and plan of care.   Final Clinical Impression(s) / ED Diagnoses Final diagnoses:  Other closed displaced fracture of proximal end of right humerus, initial encounter    Rx / DC Orders ED Discharge Orders         Ordered    HYDROcodone-acetaminophen (NORCO/VICODIN) 5-325 MG tablet  Every 4 hours PRN        08/30/20 2033           Launa Flight, MD 08/30/20 1594    Sherwood Gambler, MD 08/31/20 1513

## 2020-08-30 NOTE — Progress Notes (Signed)
Orthopedic Tech Progress Note Patient Details:  Nancy Lang 05-14-1946 [id number]  Ortho Devices Type of Ortho Device: Shoulder immobilizer Ortho Device/Splint Location: RUE Ortho Device/Splint Interventions: Ordered, Application, Adjustment   Post Interventions Patient Tolerated: Fair Instructions Provided: Care of device, Adjustment of device, Poper ambulation with device   Olivia Pavelko 08/30/2020, 8:39 PM

## 2020-09-03 DIAGNOSIS — S42211A Unspecified displaced fracture of surgical neck of right humerus, initial encounter for closed fracture: Secondary | ICD-10-CM | POA: Diagnosis not present

## 2020-09-03 DIAGNOSIS — S42213A Unspecified displaced fracture of surgical neck of unspecified humerus, initial encounter for closed fracture: Secondary | ICD-10-CM | POA: Diagnosis not present

## 2020-09-05 ENCOUNTER — Encounter: Payer: Self-pay | Admitting: Nurse Practitioner

## 2020-09-05 ENCOUNTER — Other Ambulatory Visit: Payer: Self-pay

## 2020-09-05 ENCOUNTER — Ambulatory Visit (INDEPENDENT_AMBULATORY_CARE_PROVIDER_SITE_OTHER): Payer: Medicare HMO | Admitting: Nurse Practitioner

## 2020-09-05 ENCOUNTER — Other Ambulatory Visit: Payer: Self-pay | Admitting: Nurse Practitioner

## 2020-09-05 VITALS — BP 158/68 | HR 66 | Temp 97.7°F | Ht 62.0 in | Wt 109.0 lb

## 2020-09-05 DIAGNOSIS — R300 Dysuria: Secondary | ICD-10-CM

## 2020-09-05 DIAGNOSIS — S42213A Unspecified displaced fracture of surgical neck of unspecified humerus, initial encounter for closed fracture: Secondary | ICD-10-CM | POA: Diagnosis not present

## 2020-09-05 DIAGNOSIS — K5903 Drug induced constipation: Secondary | ICD-10-CM | POA: Diagnosis not present

## 2020-09-05 DIAGNOSIS — S42291A Other displaced fracture of upper end of right humerus, initial encounter for closed fracture: Secondary | ICD-10-CM | POA: Diagnosis not present

## 2020-09-05 DIAGNOSIS — S42221A 2-part displaced fracture of surgical neck of right humerus, initial encounter for closed fracture: Secondary | ICD-10-CM | POA: Diagnosis not present

## 2020-09-05 DIAGNOSIS — N3000 Acute cystitis without hematuria: Secondary | ICD-10-CM | POA: Diagnosis not present

## 2020-09-05 LAB — POCT URINALYSIS DIPSTICK
Bilirubin, UA: NEGATIVE
Blood, UA: NEGATIVE
Glucose, UA: POSITIVE — AB
Leukocytes, UA: NEGATIVE
Nitrite, UA: NEGATIVE
Protein, UA: POSITIVE — AB
Spec Grav, UA: 1.02 (ref 1.010–1.025)
Urobilinogen, UA: 1 E.U./dL
pH, UA: 6 (ref 5.0–8.0)

## 2020-09-05 MED ORDER — NITROFURANTOIN MONOHYD MACRO 100 MG PO CAPS: 100.0000 mg | ORAL_CAPSULE | Freq: Two times a day (BID) | ORAL | 0 refills | Status: DC

## 2020-09-05 MED ORDER — POLYETHYLENE GLYCOL 3350 17 GM/SCOOP PO POWD: 17.0000 g | Freq: Every day | ORAL | 1 refills | Status: DC

## 2020-09-05 NOTE — Patient Instructions (Addendum)
Urinary Tract Infection, Adult Preventing Constipation After Surgery Constipation is a common problem after surgery. Many things can make constipation more likely after a surgery, including:  Certain medicines, especially numbing medicines (anesthetics) and very strong pain medicines called opioids.  Feeling stressed because of the surgery.  Eating different foods than normal.  Being less active. Symptoms of constipation include:  Having fewer than three bowel movements a week.  Straining to have a bowel movement.  Having hard, dry, or larger-than-normal stools.  Feeling full or bloated.  Having pain in the lower abdomen.  Not feeling relief after having a bowel movement. You can take steps to help prevent constipation after surgery. Follow these instructions at home: Eating and drinking   Eat foods that have a lot of fiber in them. These include fresh fruits and vegetables, whole grains, and beans.  Limit foods that are high in fat and processed sugars, such as fried and sweet foods. These include french fries, hamburgers, cookies, and candy.  Take a fiber supplement as told by your health care provider. If you are not taking a fiber supplement and you think you are not getting enough fiber from foods, talk to your health care provider about adding a fiber supplement to your diet.  Drink enough fluid to keep your urine pale yellow.  Drink clear fluids, especially water. Avoid drinking alcohol, caffeine, and soda. These can make constipation worse. Activity  After surgery, return to your normal activities slowly, or when your health care provider says it is okay.  Start walking as soon as you can. Try to go a little farther each day.  Once your health care provider approves, do some sort of regular exercise. This helps prevent constipation. Bowel movements  Go to the restroom when you have the urge to go. Do not hold it in.  Try drinking something hot to get a bowel  movement started.  Keep track of how often you use the restroom. Medicine  Take over-the-counter and prescription medicines only as told by your health care provider.  Talk to your health care provider about medicines that may help prevent constipation, particularly if you have a history of constipation. Your health care provider may suggest a stool softener, laxative, or fiber supplement.  Do not take any medicines without talking to your health care provider first. Contact a health care provider if:  You used stool softeners or laxatives and still have not had a bowel movement within 24-48 hours after using them.  You have not had a bowel movement in 3 days.  You have a fever. Get help right away if:  Your constipation lasts for more than 4 days or gets worse.  You have bright red blood in your stool.  You have pain in the abdomen or rectum.  You have very bad cramping.  You have thin, pencil-like stools.  You have unexplained weight loss. Summary  Constipation is a common problem after surgery. Many things can make constipation more likely after a surgery, including certain medicines, eating different foods than normal, and being less active.  Symptoms of constipation include having fewer than three bowel movements a week, straining to have a bowel movement, pain in the lower abdomen, and feeling full or bloated.  A diet rich in high-fiber foods, fluids, physical activity, and medicines, such as stool softeners and laxatives, can help prevent constipation. This information is not intended to replace advice given to you by your health care provider. Make sure you discuss any questions  you have with your health care provider. Document Revised: 09/24/2017 Document Reviewed: 01/11/2017 Elsevier Patient Education  [place]. A urinary tract infection (UTI) is an infection of any part of the urinary tract. The urinary tract includes:  The kidneys.  The  ureters.  The bladder.  The urethra. These organs make, store, and get rid of pee (urine) in the body. What are the causes? This is caused by germs (bacteria) in your genital area. These germs grow and cause swelling (inflammation) of your urinary tract. What increases the risk? You are more likely to develop this condition if:  You have a small, thin tube (catheter) to drain pee.  You cannot control when you pee or poop (incontinence).  You are female, and: ? You use these methods to prevent pregnancy:  A medicine that kills sperm (spermicide).  A device that blocks sperm (diaphragm). ? You have low levels of a female hormone (estrogen). ? You are pregnant.  You have genes that add to your risk.  You are sexually active.  You take antibiotic medicines.  You have trouble peeing because of: ? A prostate that is bigger than normal, if you are female. ? A blockage in the part of your body that drains pee from the bladder (urethra). ? A kidney stone. ? A nerve condition that affects your bladder (neurogenic bladder). ? Not getting enough to drink. ? Not peeing often enough.  You have other conditions, such as: ? Diabetes. ? A weak disease-fighting system (immune system). ? Sickle cell disease. ? Gout. ? Injury of the spine. What are the signs or symptoms? Symptoms of this condition include:  Needing to pee right away (urgently).  Peeing often.  Peeing small amounts often.  Pain or burning when peeing.  Blood in the pee.  Pee that smells bad or not like normal.  Trouble peeing.  Pee that is cloudy.  Fluid coming from the vagina, if you are female.  Pain in the belly or lower back. Other symptoms include:  Throwing up (vomiting).  No urge to eat.  Feeling mixed up (confused).  Being tired and grouchy (irritable).  A fever.  Watery poop (diarrhea). How is this treated? This condition may be treated with:  Antibiotic medicine.  Other  medicines.  Drinking enough water. Follow these instructions at home:  Medicines  Take over-the-counter and prescription medicines only as told by your doctor.  If you were prescribed an antibiotic medicine, take it as told by your doctor. Do not stop taking it even if you start to feel better. General instructions  Make sure you: ? Pee until your bladder is empty. ? Do not hold pee for a long time. ? Empty your bladder after sex. ? Wipe from front to back after pooping if you are a female. Use each tissue one time when you wipe.  Drink enough fluid to keep your pee pale yellow.  Keep all follow-up visits as told by your doctor. This is important. Contact a doctor if:  You do not get better after 1-2 days.  Your symptoms go away and then come back. Get help right away if:  You have very bad back pain.  You have very bad pain in your lower belly.  You have a fever.  You are sick to your stomach (nauseous).  You are throwing up. Summary  A urinary tract infection (UTI) is an infection of any part of the urinary tract.  This condition is caused by germs in your  genital area.  There are many risk factors for a UTI. These include having a small, thin tube to drain pee and not being able to control when you pee or poop.  Treatment includes antibiotic medicines for germs.  Drink enough fluid to keep your pee pale yellow. This information is not intended to replace advice given to you by your health care provider. Make sure you discuss any questions you have with your health care provider. Document Revised: 09/29/2018 Document Reviewed: 04/21/2018 Elsevier Patient Education  2020 [place] for Falls Each year, millions of people have serious injuries from falls. It is important to understand your risk for falling. Talk with your health care provider about your risk and what you can do to lower it. There are actions you can take at home to  lower your risk. If you do have a serious fall, make sure you tell your health care provider. Falling once raises your risk for falling again. How can falls affect me? Serious injuries from falls are common. These include:  Broken bones, such as hip fractures.  Head injuries, such as traumatic brain injuries (TBI). Fear of falling can also cause you to avoid activities and stay at home. This can make your muscles weaker and actually raise your risk for a fall. What can increase my risk? There are a number of risk factors that increase your risk for falling. The more risk factors you have, the higher your risk for falling. Serious injuries from a fall most often happen to people older than age 42. Children and young adults ages 9-29 are also at higher risk. Common risk factors include:  Weakness in the lower body.  Lack (deficiency) of vitamin D.  Being generally weak or confused due to long-term (chronic) illness.  Dizziness or balance problems.  Poor vision.  Medicines that cause dizziness or drowsiness. These can include medicines for your blood pressure, heart, anxiety, insomnia, or edema, as well as pain medicines and muscle relaxants. Other risk factors include:  Drinking alcohol.  Having had a fall in the past.  Having depression.  Foot pain or improper footwear.  Working at a dangerous job.  Having any of the following in your home: ? Tripping hazards, such as floor clutter or loose rugs. ? Poor lighting. ? Pets or clutter.  Dementia or memory loss. What actions can I take to lower my risk of falling?     Physical activity Maintain physical fitness. Do strength and balance exercises. Consider taking a regular class to build strength and balance. Yoga and tai chi are good options. Vision Have your eyes checked every year and your vision prescription updated as needed. Walking aids and footwear  Wear nonskid shoes. Do not wear high heels.  Do not walk  around the house in socks or slippers.  Use a cane or walker as told by your health care provider. Home safety  Attach secure railings on both sides of your stairs.  Install grab bars for your tub, shower, and toilet. Use a bath mat in your tub or shower.  Use good lighting in all rooms. Keep a flashlight near your bed.  Make sure there is a clear path from your bed to the bathroom. Use night-lights.  Do not use throw rugs. Make sure all carpeting is taped or tacked down securely.  Remove all clutter from walkways and stairways, including extension cords.  Repair uneven or broken steps.  Avoid walking on icy or slippery surfaces.  Walk on the grass instead of on icy or slick sidewalks. Where you can, use ice melt to get rid of ice on walkways.  Use a cordless phone. Questions to ask your health care provider  Can you help me check my risk for a fall?  Do any of my medicines make me more likely to fall?  Should I take a vitamin D supplement?  What exercises can I do to improve my strength and balance?  Should I make an appointment to have my vision checked?  Do I need a bone density test to check for weak bones or osteoporosis?  Would it help to use a cane or a walker? Where to find more information  Centers for Disease Control and Prevention, [place]ADI: http://www.wolf.info/  Community-Based Fall Prevention Programs: http://www.wolf.info/  National Institute on Aging: T[employer]Connect.com.ee Contact a health care provider if:  You fall at home.  You are afraid of falling at home.  You feel weak, drowsy, or dizzy. Summary  People 26 and older are at high risk for falling. However, older people are not the only ones injured in falls. Children and young adults have a higher-than-normal risk too.  Talk with your health care provider about your risks for falling and how to lower those risks.  Taking certain precautions at home can lower your risk for falling.  If you fall, always tell your  health care provider. This information is not intended to replace advice given to you by your health care provider. Make sure you discuss any questions you have with your health care provider. Document Revised: 04/19/2019 Document Reviewed: 04/19/2019 Elsevier Patient Education  [place] and Fractures  Falls can be very serious, especially for older adults or people with osteoporosis  Falls can be caused by:  Tripping or slipping  Slow reflexes  Balance problems  Reduced muscle strength  Poor vision or a recent change in prescription  Illness and some medications (especially blood pressure pills, diuretics, heart medicines, muscle relaxants and sleep medications)  Drinking alcohol  To prevent falls outdoors:  Use a can or walker if needed  Wear rubber-soled shoes so you don't slip  DO NOT buy "shape up" shoes with rocker bottom soles if you have balance problems.  The thick soles and shape make it more difficult to keep your balance.  Put kitty litter or salt on icy sidewalks  Walk on the grass if the sidewalks are slick  Avoid walking on uneven ground whenever possible  T prevent falls indoors:  Keep rooms clutter-free, especially hallways, stairs and paths to light switches  Remove throw rugs  Install night lights, especially to and in the bathroom  Turn on lights before going downstairs  Keep a flashlight next to your bed  Buy a cordless phone to keep with you instead of jumping up to answer the phone  Install grab bars in the bathroom near the shower and toilet  Install rails on both sides of the stairs.  Make sure the stairs are well lit  Wear slippers with non-skid soles.  Do not walk around in stockings or socks  Balance problems and dizziness are not a normal part of growing older.  If you begin having balance problems or dizziness see your doctor.  Physical Therapy can help you with many balance problems, strengthening hip  and leg muscles and with gait training.  To keep your bones healthy make sure you are getting enough calcium and Vitamin D each day.  Ask  your doctor or pharmacist about supplements.  Regular weight-bearing exercise like walking, lifting weights or dancing can help strengthen bones and prevent osteoporosis. Polyethylene Glycol powder What is this medicine? POLYETHYLENE GLYCOL 3350 (pol ee ETH i leen; GLYE col) powder is a laxative used to treat constipation. It increases the amount of water in the stool. Bowel movements become easier and more frequent. This medicine may be used for other purposes; ask your health care provider or pharmacist if you have questions. COMMON BRAND NAME(S): Sharlyn Bologna, GlycoLax, Healthylax, MiraLax, Smooth LAX, Vita Health What should I tell my health care provider before I take this medicine? They need to know if you have any of these conditions:  a history of blockage of the stomach or intestine  current abdomen distension or pain  difficulty swallowing  diverticulitis, ulcerative colitis, or other chronic bowel disease  phenylketonuria  an unusual or allergic reaction to polyethylene glycol, other medicines, dyes, or preservatives  pregnant or trying to get pregnant  breast-feeding How should I use this medicine? Take this medicine by mouth. The bottle has a measuring cap that is marked with a line. Pour the powder into the cap up to the marked line (the dose is about 1 heaping tablespoon). Add the powder in the cap to a full glass (4 to 8 ounces or 120 to 240 mL) of water, juice, soda, coffee or tea. Mix the powder well. Ensure that the powder is fully dissolved. Do not drink if there are any clumps. Drink the solution. Take exactly as directed. Do not take your medicine more often than directed. Talk to your pediatrician regarding the use of this medicine in children. Special care may be needed. Overdosage: If you think you have taken too much of  this medicine contact a poison control center or emergency room at once. NOTE: This medicine is only for you. Do not share this medicine with others. What if I miss a dose? If you miss a dose, take it as soon as you can. If it is almost time for your next dose, take only that dose. Do not take double or extra doses. What may interact with this medicine? Interactions are not expected. This list may not describe all possible interactions. Give your health care provider a list of all the medicines, herbs, non-prescription drugs, or dietary supplements you use. Also tell them if you smoke, drink alcohol, or use illegal drugs. Some items may interact with your medicine. What should I watch for while using this medicine? Do not use for more than 2 weeks without advice from your doctor or health care professional. It can take 2 to 4 days to have a bowel movement and to experience improvement in constipation. See your health care professional for any changes in bowel habits, including constipation, that are severe or last longer than three weeks. Always take this medicine with plenty of water. What side effects may I notice from receiving this medicine? Side effects that you should report to your doctor or health care professional as soon as possible:  diarrhea  difficulty breathing  itching of the skin, hives, or skin rash  severe bloating, pain, or distension of the stomach  vomiting Side effects that usually do not require medical attention (report to your doctor or health care professional if they continue or are bothersome):  bloating or gas  lower abdominal discomfort or cramps  nausea This list may not describe all possible side effects. Call your doctor for medical advice about side  effects. You may report side effects to FDA at 1-800-FDA-1088. Where should I keep my medicine? Keep out of the reach of children. Store between 15 and 30 degrees C (59 and 86 degrees F). Throw away any  unused medicine after the expiration date. NOTE: This sheet is a summary. It may not cover all possible information. If you have questions about this medicine, talk to your doctor, pharmacist, or health care provider.  2020 Elsevier/Gold Standard (2018-03-31 10:42:01)  Constipation, Adult Constipation is when a person:  Poops (has a bowel movement) fewer times in a week than normal.  Has a hard time pooping.  Has poop that is dry, hard, or bigger than normal. Follow these instructions at home: Eating and drinking   Eat foods that have a lot of fiber, such as: ? Fresh fruits and vegetables. ? Whole grains. ? Beans.  Eat less of foods that are high in fat, low in fiber, or overly processed, such as: ? [place] fries. ? Hamburgers. ? Cookies. ? Candy. ? Soda.  Drink enough fluid to keep your pee (urine) clear or pale yellow. General instructions  Exercise regularly or as told by your doctor.  Go to the restroom when you feel like you need to poop. Do not hold it in.  Take over-the-counter and prescription medicines only as told by your doctor. These include any fiber supplements.  Do pelvic floor retraining exercises, such as: ? Doing deep breathing while relaxing your lower belly (abdomen). ? Relaxing your pelvic floor while pooping.  Watch your condition for any changes.  Keep all follow-up visits as told by your doctor. This is important. Contact a doctor if:  You have pain that gets worse.  You have a fever.  You have not pooped for 4 days.  You throw up (vomit).  You are not hungry.  You lose weight.  You are bleeding from the anus.  You have thin, pencil-like poop (stool). Get help right away if:  You have a fever, and your symptoms suddenly get worse.  You leak poop or have blood in your poop.  Your belly feels hard or bigger than normal (is bloated).  You have very bad belly pain.  You feel dizzy or you faint. This information is not  intended to replace advice given to you by your health care provider. Make sure you discuss any questions you have with your health care provider. Document Revised: 09/24/2017 Document Reviewed: 04/01/2016 Elsevier Patient Education  2020 [employer].

## 2020-09-06 NOTE — Progress Notes (Signed)
Acute Office Visit  Subjective:    Patient ID: Nancy Lang, female    DOB: Sep 14, 1946, 74 y.o.   MRN: [id number]  Chief Complaint  Patient presents with  . Urinary Tract Infection    Burning with urination    Nancy Lang is a 74 year old Caucasian female that presents for evaluation of dysuria. Symptoms began one week ago that include urgency, frequency, and dysuria. Pt has history of chronic UTI. She was prescribed Macrobid 100 mg daily for prophylaxis. Zema fell last week and fractured her right humerus. Family member is present today to supplement history.   Patient is in today for dysuria  Past Medical History:  Diagnosis Date  . Angina pectoris ([place])   . Diabetes mellitus without complication ([place])   . GERD (gastroesophageal reflux disease)   . Hyperlipidemia   . Hypertension   . Thyroid disease     Past Surgical History:  Procedure Laterality Date  . CARDIAC CATHETERIZATION      Family History  Problem Relation Age of Onset  . Diabetes Mother   . Cancer Mother   . Heart disease Mother   . Heart disease Father   . Alcohol abuse Father     Social History   Socioeconomic History  . Marital status: Widowed    Spouse name: Not on file  . Number of children: Not on file  . Years of education: Not on file  . Highest education level: Not on file  Occupational History  . Occupation: retired  Tobacco Use  . Smoking status: Never Smoker  . Smokeless tobacco: Current User    Types: Snuff  Vaping Use  . Vaping Use: Never used  Substance and Sexual Activity  . Alcohol use: No  . Drug use: No  . Sexual activity: Not on file  Other Topics Concern  . Not on file  Social History Narrative  . Not on file   Social Determinants of Health   Financial Resource Strain:   . Difficulty of Paying Living Expenses: Not on file  Food Insecurity:   . Worried About Charity fundraiser in the Last Year: Not on file  . Ran Out of Food in the Last Year: Not on file   Transportation Needs:   . Lack of Transportation (Medical): Not on file  . Lack of Transportation (Non-Medical): Not on file  Physical Activity:   . Days of Exercise per Week: Not on file  . Minutes of Exercise per Session: Not on file  Stress:   . Feeling of Stress : Not on file  Social Connections:   . Frequency of Communication with Friends and Family: Not on file  . Frequency of Social Gatherings with Friends and Family: Not on file  . Attends Religious Services: Not on file  . Active Member of Clubs or Organizations: Not on file  . Attends Archivist Meetings: Not on file  . Marital Status: Not on file  Intimate Partner Violence:   . Fear of Current or Ex-Partner: Not on file  . Emotionally Abused: Not on file  . Physically Abused: Not on file  . Sexually Abused: Not on file    Outpatient Medications Prior to Visit  Medication Sig Dispense Refill  . Accu-Chek FastClix Lancets MISC     . ascorbic acid (VITAMIN C) 500 MG tablet Take 500 mg by mouth daily.    Marland Kitchen aspirin 81 MG chewable tablet Chew 81 mg by mouth daily.     Marland Kitchen  atorvastatin (LIPITOR) 80 MG tablet Take 1 tablet (80 mg total) by mouth daily. PLEASE CALL OFFICE TO SCHEDULE APPOINTMENT FOR FURTHER REFILLS (Patient taking differently: Take 80 mg by mouth at bedtime. ) 90 tablet 3  . BD PEN NEEDLE NANO 2ND GEN 32G X 4 MM MISC     . Calcium-Magnesium-Zinc (CAL-MAG-ZINC PO) Take 1 tablet by mouth daily with breakfast.    . Cholecalciferol (VITAMIN D3) 1000 units CAPS Take 1,000 Units by mouth daily.     Marland Kitchen docusate sodium (STOOL SOFTENER) 100 MG capsule Take 100 mg by mouth 3 (three) times daily.    Marland Kitchen donepezil (ARICEPT) 10 MG tablet Take 10 mg by mouth at bedtime.     Marland Kitchen glucose blood (ACCU-CHEK GUIDE) test strip To check glucose tid 300 each 3  . insulin glargine (LANTUS SOLOSTAR) 100 UNIT/ML Solostar Pen ADMINISTER 25 UNITS UNDER THE SKIN EVERY NIGHT AT BEDTIME (Patient taking differently: Inject 25 Units into the  skin at bedtime. ) 15 pen 3  . levothyroxine (SYNTHROID) 50 MCG tablet TAKE 1 TABLET EVERY DAY (Patient taking differently: Take 50 mcg by mouth daily before breakfast. ) 90 tablet 1  . lisinopril (ZESTRIL) 10 MG tablet TAKE 1 TABLET(10 MG) BY MOUTH EVERY DAY (Patient taking differently: Take 10 mg by mouth daily. ) 90 tablet 3  . memantine (NAMENDA) 10 MG tablet Take 10 mg by mouth 2 (two) times daily.    . metoprolol tartrate (LOPRESSOR) 50 MG tablet Take 1 tablet (50 mg total) by mouth 2 (two) times daily. (Patient taking differently: Take 50 mg by mouth in the morning and at bedtime. ) 180 tablet 3  . montelukast (SINGULAIR) 10 MG tablet Take 1 tablet (10 mg total) by mouth daily. (Patient taking differently: Take 10 mg by mouth at bedtime. ) 90 tablet 3  . Multiple Vitamins-Minerals (ONE-A-DAY PROACTIVE 65+) TABS Take 1 tablet by mouth daily with breakfast.    . Omega-3 1000 MG CAPS Take 1,000 mg by mouth daily.     . pantoprazole (PROTONIX) 40 MG tablet TAKE 1 TABLET (40 MG TOTAL) BY MOUTH DAILY. (Patient taking differently: Take 40 mg by mouth daily before breakfast. ) 90 tablet 1  . potassium gluconate 595 (99 K) MG TABS tablet Take 595 mg by mouth daily.    . vitamin B-12 (CYANOCOBALAMIN) 1000 MCG tablet Take 1,000 mcg by mouth daily.     . nitrofurantoin, macrocrystal-monohydrate, (MACROBID) 100 MG capsule TAKE 1 CAPSULE EVERY DAY (Patient taking differently: Take 100 mg by mouth daily. ) 90 capsule 0  . nitrofurantoin, macrocrystal-monohydrate, (MACROBID) 100 MG capsule Take 100 mg by mouth daily. To prevent UTI's    . benzonatate (TESSALON) 100 MG capsule Take 1 capsule (100 mg total) by mouth 2 (two) times daily as needed for cough. (Patient not taking: Reported on 08/30/2020) 20 capsule 0  . ciprofloxacin (CIPRO) 250 MG tablet Take 1 tablet (250 mg total) by mouth 2 (two) times daily. (Patient not taking: Reported on 08/30/2020) 14 tablet 0   No facility-administered medications prior to  visit.    Allergies  Allergen Reactions  . Pravastatin     ANGER AND MEMORY ISSUES  . Ativan [Lorazepam] Anxiety and Other (See Comments)    Made the patient "paranoid and mean" (per family)- also worsened anxiety  . Fenofibrate Rash  . Oxytetracycline Rash  . Penicillins Rash  . Septra [Sulfamethoxazole-Trimethoprim] Rash  . Sulfa Antibiotics Rash  . Tetracyclines & Related Rash    Review  of Systems  Constitutional: Positive for activity change and fatigue.  HENT: Positive for hearing loss (hard of hearing). Negative for rhinorrhea, sinus pressure and sinus pain.   Respiratory: Negative for cough and shortness of breath.   Cardiovascular: Negative for chest pain and palpitations.  Gastrointestinal: Positive for constipation.  Endocrine: Negative for cold intolerance, heat intolerance, polydipsia, polyphagia and polyuria.  Genitourinary: Positive for dysuria, frequency and urgency. Negative for flank pain and hematuria.  Musculoskeletal: Positive for arthralgias, back pain, gait problem, joint swelling and myalgias.       Right humerus fracture 08/30/20  Skin:       Bruising to right arm s/p fall with humerus fracture  Neurological: Positive for weakness and light-headedness.  Psychiatric/Behavioral: Positive for agitation.       Objective:    BP (!) 158/68 (BP Location: Left Arm, Patient Position: Sitting)   Pulse 66   Temp 97.7 F (36.5 C) (Temporal)   Ht 5\' 2"  (1.575 m)   Wt 109 lb (49.4 kg)   SpO2 98%   BMI 19.94 kg/m  Physical Exam Vitals reviewed.  Constitutional:      Appearance: She is ill-appearing.  HENT:     Head: Normocephalic.     Mouth/Throat:     Mouth: Mucous membranes are dry.     Comments: Poor dentition Cardiovascular:     Rate and Rhythm: Normal rate and regular rhythm.     Pulses: Normal pulses.     Heart sounds: Normal heart sounds.  Pulmonary:     Effort: Pulmonary effort is normal.  Abdominal:     General: Bowel sounds are normal.      Palpations: Abdomen is soft.     Tenderness: There is no right CVA tenderness, left CVA tenderness or guarding.  Musculoskeletal:        General: Swelling, tenderness and signs of injury present.     Comments: Right upper extremity edema and bruising  Skin:    General: Skin is warm and dry.     Capillary Refill: Capillary refill takes less than 2 seconds.     Findings: Bruising (right arm, hip) present.  Neurological:     Mental Status: She is alert. Mental status is at baseline.     BP (!) 158/68 (BP Location: Left Arm, Patient Position: Sitting)   Pulse 66   Temp 97.7 F (36.5 C) (Temporal)   Ht 5\' 2"  (1.575 m)   Wt 109 lb (49.4 kg)   SpO2 98%   BMI 19.94 kg/m  Wt Readings from Last 3 Encounters:  09/05/20 109 lb (49.4 kg)  06/12/20 109 lb 3.2 oz (49.5 kg)  05/23/20 114 lb (51.7 kg)    Health Maintenance Due  Topic Date Due  . Hepatitis C Screening  Never done  . COVID-19 Vaccine (1) Never done  . DEXA SCAN  Never done  . INFLUENZA VACCINE  05/26/2020  . MAMMOGRAM  07/31/2020    There are no preventive care reminders to display for this patient.   Lab Results  Component Value Date   TSH 2.770 06/12/2020   Lab Results  Component Value Date   WBC 8.6 06/12/2020   HGB 12.0 06/12/2020   HCT 35.6 06/12/2020   MCV 96 06/12/2020   PLT 279 06/12/2020   Lab Results  Component Value Date   NA 141 06/12/2020   K 4.0 06/12/2020   CO2 26 06/12/2020   GLUCOSE 178 (H) 06/12/2020   BUN 15 06/12/2020   CREATININE 0.79  06/12/2020   BILITOT 1.1 06/12/2020   ALKPHOS 149 (H) 06/12/2020   AST 19 06/12/2020   ALT 23 06/12/2020   PROT 6.9 06/12/2020   ALBUMIN 4.6 06/12/2020   CALCIUM 10.1 06/12/2020   Lab Results  Component Value Date   CHOL 125 06/12/2020   Lab Results  Component Value Date   HDL 42 06/12/2020   Lab Results  Component Value Date   LDLCALC 55 06/12/2020   Lab Results  Component Value Date   TRIG 165 (H) 06/12/2020   Lab Results   Component Value Date   CHOLHDL 3.0 06/12/2020   Lab Results  Component Value Date   HGBA1C 7.2 (H) 06/12/2020       Assessment & Plan:   Problem List Items Addressed This Visit      Genitourinary   Acute cystitis without hematuria   Relevant Medications   nitrofurantoin, macrocrystal-monohydrate, (MACROBID) 100 MG capsule    Other Visit Diagnoses    Burning with urination    -  Primary   Relevant Orders   POCT urinalysis dipstick (Completed)   Drug-induced constipation           Meds ordered this encounter  Medications  . polyethylene glycol powder (GLYCOLAX/MIRALAX) 17 GM/SCOOP powder    Sig: Take 17 g by mouth daily.    Dispense:  3350 g    Refill:  1    Order Specific Question:   Supervising Provider    AnswerRochel Brome [id number]  . nitrofurantoin, macrocrystal-monohydrate, (MACROBID) 100 MG capsule    Sig: Take 1 capsule (100 mg total) by mouth 2 (two) times daily.    Dispense:  14 capsule    Refill:  0    Order Specific Question:   Supervising Provider    AnswerShelton Silvas   Follow-up: PRN  Rip Harbour, NP

## 2020-09-09 ENCOUNTER — Ambulatory Visit: Payer: Medicare HMO | Admitting: Podiatry

## 2020-09-16 ENCOUNTER — Ambulatory Visit: Payer: Medicare HMO | Admitting: Physician Assistant

## 2020-09-16 ENCOUNTER — Ambulatory Visit: Payer: Medicare HMO | Admitting: Nurse Practitioner

## 2020-09-24 DIAGNOSIS — M25611 Stiffness of right shoulder, not elsewhere classified: Secondary | ICD-10-CM | POA: Diagnosis not present

## 2020-09-24 DIAGNOSIS — M6281 Muscle weakness (generalized): Secondary | ICD-10-CM | POA: Diagnosis not present

## 2020-09-24 DIAGNOSIS — M25511 Pain in right shoulder: Secondary | ICD-10-CM | POA: Diagnosis not present

## 2020-10-02 DIAGNOSIS — M6281 Muscle weakness (generalized): Secondary | ICD-10-CM | POA: Diagnosis not present

## 2020-10-02 DIAGNOSIS — M25611 Stiffness of right shoulder, not elsewhere classified: Secondary | ICD-10-CM | POA: Diagnosis not present

## 2020-10-02 DIAGNOSIS — M25511 Pain in right shoulder: Secondary | ICD-10-CM | POA: Diagnosis not present

## 2020-10-10 ENCOUNTER — Other Ambulatory Visit: Payer: Self-pay | Admitting: Physician Assistant

## 2020-10-10 DIAGNOSIS — N3000 Acute cystitis without hematuria: Secondary | ICD-10-CM

## 2020-10-15 DIAGNOSIS — M25511 Pain in right shoulder: Secondary | ICD-10-CM | POA: Diagnosis not present

## 2020-10-15 DIAGNOSIS — M6281 Muscle weakness (generalized): Secondary | ICD-10-CM | POA: Diagnosis not present

## 2020-10-15 DIAGNOSIS — M25611 Stiffness of right shoulder, not elsewhere classified: Secondary | ICD-10-CM | POA: Diagnosis not present

## 2020-10-23 ENCOUNTER — Ambulatory Visit (INDEPENDENT_AMBULATORY_CARE_PROVIDER_SITE_OTHER): Payer: Medicare HMO | Admitting: Physician Assistant

## 2020-10-23 ENCOUNTER — Other Ambulatory Visit: Payer: Self-pay

## 2020-10-23 ENCOUNTER — Encounter: Payer: Self-pay | Admitting: Physician Assistant

## 2020-10-23 VITALS — BP 130/68 | HR 63 | Temp 97.3°F | Ht 62.0 in | Wt 107.6 lb

## 2020-10-23 DIAGNOSIS — E782 Mixed hyperlipidemia: Secondary | ICD-10-CM | POA: Diagnosis not present

## 2020-10-23 DIAGNOSIS — I10 Essential (primary) hypertension: Secondary | ICD-10-CM

## 2020-10-23 DIAGNOSIS — Z23 Encounter for immunization: Secondary | ICD-10-CM | POA: Diagnosis not present

## 2020-10-23 DIAGNOSIS — N3090 Cystitis, unspecified without hematuria: Secondary | ICD-10-CM

## 2020-10-23 DIAGNOSIS — Z794 Long term (current) use of insulin: Secondary | ICD-10-CM

## 2020-10-23 DIAGNOSIS — R195 Other fecal abnormalities: Secondary | ICD-10-CM | POA: Diagnosis not present

## 2020-10-23 DIAGNOSIS — E039 Hypothyroidism, unspecified: Secondary | ICD-10-CM

## 2020-10-23 DIAGNOSIS — E119 Type 2 diabetes mellitus without complications: Secondary | ICD-10-CM

## 2020-10-23 DIAGNOSIS — E559 Vitamin D deficiency, unspecified: Secondary | ICD-10-CM

## 2020-10-23 DIAGNOSIS — N3000 Acute cystitis without hematuria: Secondary | ICD-10-CM

## 2020-10-23 LAB — POCT URINALYSIS DIPSTICK
Bilirubin, UA: NEGATIVE
Blood, UA: NEGATIVE
Glucose, UA: NEGATIVE
Ketones, UA: NEGATIVE
Nitrite, UA: NEGATIVE
Protein, UA: POSITIVE — AB
Spec Grav, UA: >=1.03 — AB (ref 1.010–1.025)
Urobilinogen, UA: 0.2 E.U./dL
pH, UA: 6 (ref 5.0–8.0)

## 2020-10-23 LAB — HEMOCCULT GUIAC POC 1CARD (OFFICE): Fecal Occult Blood, POC: NEGATIVE

## 2020-10-23 MED ORDER — CIPROFLOXACIN HCL 500 MG PO TABS: 500.0000 mg | ORAL_TABLET | Freq: Two times a day (BID) | ORAL | 0 refills | Status: DC

## 2020-10-23 NOTE — Progress Notes (Signed)
Established Patient Office Visit  Subjective:  Patient ID: Nancy Lang, female    DOB: 1946-02-05  Age: 74 y.o. MRN: [id number]  CC:  Chief Complaint  Patient presents with  . Melena    HPI Nancy Lang presents for chronic follow up   Mixed hyperlipidemia  Pt presents with hyperlipidemia. . Compliance with treatment has been good The patient is compliant with medications, maintains a low cholesterol diet , follows up as directed , and maintains an exercise regimen . The patient denies experiencing any hypercholesterolemia related symptoms.  She is currently taking lipitor 80mg  qd  Pt presents for follow up of hypertension.  The patient is tolerating the medication well without side effects. Compliance with treatment has been good; including taking medication as directed , maintains a healthy diet and regular exercise regimen , and following up as directed. Pt currently on zestril 10mg  qd and metoprolol 50mg  qd  Dementia: She is here for follow up of cognitive problems. She is accompanied by granddaughter and daughter.  Primary caregiver are them. The family and the patient identify problems with changes in short and long term memory. Family and patient report problems with agitation.  Medication administration: family monitors medication usage   Functional Assessment:  Activities of Daily Living (ADLs):   She is independent in the following: feeding Requires assistance with the following: bathing and hygiene, toileting and dressing Currently on Namenda 10mg  qd and aricept 10mg  qd --- is currently following with neurology  Hypothyroidism: Patient presents for evaluation of thyroid function. Currently no symptoms.The problem has been unchanged.  Previous thyroid studies include TSH. The hypothyroidism is due to hypothyroidism.she is currently on levothyroxine 58mcg qd  Pt for follow up for diabetes - states glucose ranges are good in the mornings between 120-150 - family did  decrease her lantus down to 20 units at bedtime because her sugars had started dropping - due for labwork today  Pt was concerned that the patient was having dark stools however upon further questioning the stools have actually been dark green - patient denies nausea / vomiting/ diarrhea/ fever  Pt with possible uti symptoms - has been complaining of mild dysuria  Past Medical History:  Diagnosis Date  . Angina pectoris ([place])   . Diabetes mellitus without complication ([place])   . GERD (gastroesophageal reflux disease)   . Hyperlipidemia   . Hypertension   . Thyroid disease     Past Surgical History:  Procedure Laterality Date  . CARDIAC CATHETERIZATION      Family History  Problem Relation Age of Onset  . Diabetes Mother   . Cancer Mother   . Heart disease Mother   . Heart disease Father   . Alcohol abuse Father     Social History   Socioeconomic History  . Marital status: Widowed    Spouse name: Not on file  . Number of children: Not on file  . Years of education: Not on file  . Highest education level: Not on file  Occupational History  . Occupation: retired  Tobacco Use  . Smoking status: Never Smoker  . Smokeless tobacco: Current User    Types: Snuff  Vaping Use  . Vaping Use: Never used  Substance and Sexual Activity  . Alcohol use: No  . Drug use: No  . Sexual activity: Not on file  Other Topics Concern  . Not on file  Social History Narrative  . Not on file   Social Determinants of Health  Financial Resource Strain: Not on file  Food Insecurity: Not on file  Transportation Needs: Not on file  Physical Activity: Not on file  Stress: Not on file  Social Connections: Not on file  Intimate Partner Violence: Not on file     Current Outpatient Medications:  .  Accu-Chek FastClix Lancets MISC, , Disp: , Rfl:  .  ascorbic acid (VITAMIN C) 500 MG tablet, Take 500 mg by mouth daily., Disp: , Rfl:  .  aspirin 81 MG chewable tablet, Chew 81 mg by mouth  daily. , Disp: , Rfl:  .  atorvastatin (LIPITOR) 80 MG tablet, Take 1 tablet (80 mg total) by mouth daily. PLEASE CALL OFFICE TO SCHEDULE APPOINTMENT FOR FURTHER REFILLS (Patient taking differently: Take 80 mg by mouth at bedtime.), Disp: 90 tablet, Rfl: 3 .  BD PEN NEEDLE NANO 2ND GEN 32G X 4 MM MISC, , Disp: , Rfl:  .  Calcium-Magnesium-Zinc (CAL-MAG-ZINC PO), Take 1 tablet by mouth daily with breakfast., Disp: , Rfl:  .  Cholecalciferol (VITAMIN D3) 1000 units CAPS, Take 1,000 Units by mouth daily. , Disp: , Rfl:  .  ciprofloxacin (CIPRO) 500 MG tablet, Take 1 tablet (500 mg total) by mouth 2 (two) times daily., Disp: 6 tablet, Rfl: 0 .  docusate sodium (COLACE) 100 MG capsule, Take 100 mg by mouth 3 (three) times daily., Disp: , Rfl:  .  donepezil (ARICEPT) 10 MG tablet, Take 10 mg by mouth at bedtime. , Disp: , Rfl:  .  glucose blood (ACCU-CHEK GUIDE) test strip, To check glucose tid, Disp: 300 each, Rfl: 3 .  insulin glargine (LANTUS SOLOSTAR) 100 UNIT/ML Solostar Pen, ADMINISTER 25 UNITS UNDER THE SKIN EVERY NIGHT AT BEDTIME (Patient taking differently: Inject 25 Units into the skin at bedtime.), Disp: 15 pen, Rfl: 3 .  levothyroxine (SYNTHROID) 50 MCG tablet, TAKE 1 TABLET EVERY DAY (Patient taking differently: Take 50 mcg by mouth daily before breakfast.), Disp: 90 tablet, Rfl: 1 .  lisinopril (ZESTRIL) 10 MG tablet, TAKE 1 TABLET(10 MG) BY MOUTH EVERY DAY (Patient taking differently: Take 10 mg by mouth daily.), Disp: 90 tablet, Rfl: 3 .  memantine (NAMENDA) 10 MG tablet, Take 1 tablet by mouth 2 (two) times daily., Disp: , Rfl:  .  metoprolol tartrate (LOPRESSOR) 50 MG tablet, Take 1 tablet (50 mg total) by mouth 2 (two) times daily. (Patient taking differently: Take 50 mg by mouth in the morning and at bedtime.), Disp: 180 tablet, Rfl: 3 .  montelukast (SINGULAIR) 10 MG tablet, Take 1 tablet (10 mg total) by mouth daily. (Patient taking differently: Take 10 mg by mouth at bedtime.), Disp:  90 tablet, Rfl: 3 .  Multiple Vitamins-Minerals (ONE-A-DAY PROACTIVE 65+) TABS, Take 1 tablet by mouth daily with breakfast., Disp: , Rfl:  .  nitrofurantoin, macrocrystal-monohydrate, (MACROBID) 100 MG capsule, TAKE 1 CAPSULE EVERY DAY., Disp: 90 capsule, Rfl: 0 .  Omega-3 1000 MG CAPS, Take 1,000 mg by mouth daily. , Disp: , Rfl:  .  pantoprazole (PROTONIX) 40 MG tablet, TAKE 1 TABLET (40 MG TOTAL) BY MOUTH DAILY. (Patient taking differently: Take 40 mg by mouth daily before breakfast.), Disp: 90 tablet, Rfl: 1 .  polyethylene glycol powder (GLYCOLAX/MIRALAX) 17 GM/SCOOP powder, Take 17 g by mouth daily., Disp: 3350 g, Rfl: 1 .  potassium gluconate 595 (99 K) MG TABS tablet, Take 595 mg by mouth daily., Disp: , Rfl:  .  vitamin B-12 (CYANOCOBALAMIN) 1000 MCG tablet, Take 1,000 mcg by mouth daily. ,  Disp: , Rfl:    Allergies  Allergen Reactions  . Pravastatin     ANGER AND MEMORY ISSUES  . Ativan [Lorazepam] Anxiety and Other (See Comments)    Made the patient "paranoid and mean" (per family)- also worsened anxiety  . Fenofibrate Rash  . Oxytetracycline Rash  . Penicillins Rash  . Septra [Sulfamethoxazole-Trimethoprim] Rash  . Sulfa Antibiotics Rash  . Tetracyclines & Related Rash    ROS CONSTITUTIONAL: Negative for chills, fatigue, fever, unintentional weight gain and unintentional weight loss.  E/N/T: Negative for ear pain, nasal congestion and sore throat.  CARDIOVASCULAR: Negative for chest pain, dizziness, palpitations and pedal edema.  RESPIRATORY: Negative for recent cough and dyspnea.  GASTROINTESTINAL: see HPI GU - see HPI MSK: Negative for arthralgias and myalgias.  INTEGUMENTARY: Negative for rash.  NEUROLOGICAL: Negative for dizziness and headaches.  PSYCHIATRIC: Negative for sleep disturbance and to question depression screen.  Negative for depression, negative for anhedonia.            Objective:    PHYSICAL EXAM:   VS: BP 130/68 (BP Location: Left Arm,  Patient Position: Sitting, Cuff Size: Small)   Pulse 63   Temp (!) 97.3 F (36.3 C) (Temporal)   Ht 5\' 2"  (1.575 m)   Wt 107 lb 9.6 oz (48.8 kg)   SpO2 98%   BMI 19.68 kg/m   PHYSICAL EXAM:   VS: BP 130/68 (BP Location: Left Arm, Patient Position: Sitting, Cuff Size: Small)   Pulse 63   Temp (!) 97.3 F (36.3 C) (Temporal)   Ht 5\' 2"  (1.575 m)   Wt 107 lb 9.6 oz (48.8 kg)   SpO2 98%   BMI 19.68 kg/m   GEN: Well nourished, well developed, in no acute distress   Cardiac: RRR; no murmurs, rubs, or gallops,no edema -  Respiratory:  normal respiratory rate and pattern with no distress - normal breath sounds with no rales, rhonchi, wheezes or rubs GI: normal bowel sounds, no masses or tenderness MS: no deformity or atrophy  Skin: warm and dry, no rash  Neuro:  Alert and Oriented x 3, Strength and sensation are intact - CN II-Xii grossly intact Psych: euthymic mood, appropriate affect and demeanor  Office Visit on 10/23/2020  Component Date Value Ref Range Status  . Glucose, UA 10/23/2020 Negative  Negative Final  . Bilirubin, UA 10/23/2020 Negative   Final  . Ketones, UA 10/23/2020 Negative   Final  . Spec Grav, UA 10/23/2020 >=1.030* 1.010 - 1.025 Final  . Blood, UA 10/23/2020 Negative   Final  . pH, UA 10/23/2020 6.0  5.0 - 8.0 Final  . Protein, UA 10/23/2020 Positive* Negative Final  . Urobilinogen, UA 10/23/2020 0.2  0.2 or 1.0 E.U./dL Final  . Nitrite, UA 10/23/2020 Negative   Final  . Leukocytes, UA 10/23/2020 Trace* Negative Final    BP 130/68 (BP Location: Left Arm, Patient Position: Sitting, Cuff Size: Small)   Pulse 63   Temp (!) 97.3 F (36.3 C) (Temporal)   Ht 5\' 2"  (1.575 m)   Wt 107 lb 9.6 oz (48.8 kg)   SpO2 98%   BMI 19.68 kg/m  Wt Readings from Last 3 Encounters:  10/23/20 107 lb 9.6 oz (48.8 kg)  09/05/20 109 lb (49.4 kg)  06/12/20 109 lb 3.2 oz (49.5 kg)   HEMOCCULT NEGATIVE  There are no preventive care reminders to display for this  patient.  There are no preventive care reminders to display for this patient.  Lab  Results  Component Value Date   TSH 2.770 06/12/2020   Lab Results  Component Value Date   WBC 8.6 06/12/2020   HGB 12.0 06/12/2020   HCT 35.6 06/12/2020   MCV 96 06/12/2020   PLT 279 06/12/2020   Lab Results  Component Value Date   NA 141 06/12/2020   K 4.0 06/12/2020   CO2 26 06/12/2020   GLUCOSE 178 (H) 06/12/2020   BUN 15 06/12/2020   CREATININE 0.79 06/12/2020   BILITOT 1.1 06/12/2020   ALKPHOS 149 (H) 06/12/2020   AST 19 06/12/2020   ALT 23 06/12/2020   PROT 6.9 06/12/2020   ALBUMIN 4.6 06/12/2020   CALCIUM 10.1 06/12/2020   Lab Results  Component Value Date   CHOL 125 06/12/2020   Lab Results  Component Value Date   HDL 42 06/12/2020   Lab Results  Component Value Date   LDLCALC 55 06/12/2020   Lab Results  Component Value Date   TRIG 165 (H) 06/12/2020   Lab Results  Component Value Date   CHOLHDL 3.0 06/12/2020   Lab Results  Component Value Date   HGBA1C 7.2 (H) 06/12/2020      Assessment & Plan:   Problem List Items Addressed This Visit      Cardiovascular and Mediastinum   Essential hypertension, benign   Relevant Orders   CBC with Differential/Platelet   Comprehensive metabolic panel Continue meds     Endocrine   Acquired hypothyroidism   Relevant Orders   TSH Continue meds   Insulin dependent type 2 diabetes mellitus, controlled (HCC)   Relevant Orders   Hemoglobin A1c Continue meds and watch diet     Other   Mixed hyperlipidemia   Relevant Orders   Lipid panel Continue meds/low fat low chol diet    Other Visit Diagnoses    Cystitis    -  Primary   Acute infective cystitis       Relevant Orders   POCT urinalysis dipstick (Completed)   Urine Culture rx for cipro   Need for prophylactic vaccination and inoculation against influenza       Relevant Orders   Flu Vaccine QUAD High Dose(Fluad)   Vitamin D insufficiency       Relevant  Orders   VITAMIN D 25 Hydroxy (Vit-D Deficiency, Fractures)   Dark stools       Relevant Orders   Hemoccult - 1 Card (office) Sent home with 1 card also      Meds ordered this encounter  Medications  . ciprofloxacin (CIPRO) 500 MG tablet    Sig: Take 1 tablet (500 mg total) by mouth 2 (two) times daily.    Dispense:  6 tablet    Refill:  0    Order Specific Question:   Supervising Provider    Answer:   Corey Harold    Follow-up: Return in about 3 months (around 01/21/2021) for chronic fasting follow up.    SARA R Gal Smolinski, PA-C

## 2020-10-24 LAB — CBC WITH DIFFERENTIAL/PLATELET
Basophils Absolute: 0.1 10*3/uL (ref 0.0–0.2)
Basos: 1 %
EOS (ABSOLUTE): 0.1 10*3/uL (ref 0.0–0.4)
Eos: 1 %
Hematocrit: 37.7 % (ref 34.0–46.6)
Hemoglobin: 12.4 g/dL (ref 11.1–15.9)
Immature Grans (Abs): 0 10*3/uL (ref 0.0–0.1)
Immature Granulocytes: 0 %
Lymphocytes Absolute: 1.5 10*3/uL (ref 0.7–3.1)
Lymphs: 17 %
MCH: 30.6 pg (ref 26.6–33.0)
MCHC: 32.9 g/dL (ref 31.5–35.7)
MCV: 93 fL (ref 79–97)
Monocytes Absolute: 0.7 10*3/uL (ref 0.1–0.9)
Monocytes: 8 %
Neutrophils Absolute: 6.6 10*3/uL (ref 1.4–7.0)
Neutrophils: 73 %
Platelets: 353 10*3/uL (ref 150–450)
RBC: 4.05 x10E6/uL (ref 3.77–5.28)
RDW: 13.9 % (ref 11.7–15.4)
WBC: 9 10*3/uL (ref 3.4–10.8)

## 2020-10-24 LAB — LIPID PANEL
Chol/HDL Ratio: 2.4 ratio (ref 0.0–4.4)
Cholesterol, Total: 105 mg/dL (ref 100–199)
HDL: 43 mg/dL (ref >39)
LDL Chol Calc (NIH): 40 mg/dL (ref 0–99)
Triglycerides: 121 mg/dL (ref 0–149)
VLDL Cholesterol Cal: 22 mg/dL (ref 5–40)

## 2020-10-24 LAB — COMPREHENSIVE METABOLIC PANEL
ALT: 24 IU/L (ref 0–32)
AST: 22 IU/L (ref 0–40)
Albumin/Globulin Ratio: 1.9 (ref 1.2–2.2)
Albumin: 4.3 g/dL (ref 3.7–4.7)
Alkaline Phosphatase: 131 IU/L — ABNORMAL HIGH (ref 44–121)
BUN/Creatinine Ratio: 19 (ref 12–28)
BUN: 14 mg/dL (ref 8–27)
Bilirubin Total: 1 mg/dL (ref 0.0–1.2)
CO2: 26 mmol/L (ref 20–29)
Calcium: 9.9 mg/dL (ref 8.7–10.3)
Chloride: 103 mmol/L (ref 96–106)
Creatinine, Ser: 0.72 mg/dL (ref 0.57–1.00)
GFR calc Af Amer: 95 mL/min/{1.73_m2} (ref >59)
GFR calc non Af Amer: 82 mL/min/{1.73_m2} (ref >59)
Globulin, Total: 2.3 g/dL (ref 1.5–4.5)
Glucose: 108 mg/dL — ABNORMAL HIGH (ref 65–99)
Potassium: 4 mmol/L (ref 3.5–5.2)
Sodium: 144 mmol/L (ref 134–144)
Total Protein: 6.6 g/dL (ref 6.0–8.5)

## 2020-10-24 LAB — VITAMIN D 25 HYDROXY (VIT D DEFICIENCY, FRACTURES): Vit D, 25-Hydroxy: 57.8 ng/mL (ref 30.0–100.0)

## 2020-10-24 LAB — CARDIOVASCULAR RISK ASSESSMENT

## 2020-10-24 LAB — HEMOGLOBIN A1C
Est. average glucose Bld gHb Est-mCnc: 160 mg/dL
Hgb A1c MFr Bld: 7.2 % — ABNORMAL HIGH (ref 4.8–5.6)

## 2020-10-24 LAB — TSH: TSH: 1.77 u[IU]/mL (ref 0.450–4.500)

## 2020-10-25 LAB — URINE CULTURE

## 2020-10-31 DIAGNOSIS — M6281 Muscle weakness (generalized): Secondary | ICD-10-CM | POA: Diagnosis not present

## 2020-10-31 DIAGNOSIS — M25511 Pain in right shoulder: Secondary | ICD-10-CM | POA: Diagnosis not present

## 2020-10-31 DIAGNOSIS — M25611 Stiffness of right shoulder, not elsewhere classified: Secondary | ICD-10-CM | POA: Diagnosis not present

## 2020-11-08 DIAGNOSIS — S42213A Unspecified displaced fracture of surgical neck of unspecified humerus, initial encounter for closed fracture: Secondary | ICD-10-CM | POA: Diagnosis not present

## 2020-11-08 DIAGNOSIS — S42201D Unspecified fracture of upper end of right humerus, subsequent encounter for fracture with routine healing: Secondary | ICD-10-CM | POA: Diagnosis not present

## 2020-11-21 ENCOUNTER — Other Ambulatory Visit: Payer: Self-pay | Admitting: Physician Assistant

## 2020-11-28 ENCOUNTER — Other Ambulatory Visit: Payer: Self-pay | Admitting: Physician Assistant

## 2020-11-28 DIAGNOSIS — H348312 Tributary (branch) retinal vein occlusion, right eye, stable: Secondary | ICD-10-CM | POA: Diagnosis not present

## 2020-11-28 DIAGNOSIS — H34812 Central retinal vein occlusion, left eye, with macular edema: Secondary | ICD-10-CM | POA: Diagnosis not present

## 2020-12-18 ENCOUNTER — Other Ambulatory Visit: Payer: Self-pay | Admitting: Physician Assistant

## 2020-12-22 ENCOUNTER — Other Ambulatory Visit: Payer: Self-pay | Admitting: Physician Assistant

## 2020-12-23 ENCOUNTER — Other Ambulatory Visit: Payer: Self-pay | Admitting: Physician Assistant

## 2020-12-23 DIAGNOSIS — N3000 Acute cystitis without hematuria: Secondary | ICD-10-CM

## 2020-12-31 ENCOUNTER — Ambulatory Visit (INDEPENDENT_AMBULATORY_CARE_PROVIDER_SITE_OTHER): Payer: Medicare HMO | Admitting: Physician Assistant

## 2020-12-31 ENCOUNTER — Other Ambulatory Visit: Payer: Self-pay | Admitting: Physician Assistant

## 2020-12-31 ENCOUNTER — Encounter: Payer: Self-pay | Admitting: Physician Assistant

## 2020-12-31 ENCOUNTER — Other Ambulatory Visit: Payer: Self-pay

## 2020-12-31 VITALS — BP 130/70 | HR 64 | Temp 97.0°F | Ht 62.0 in | Wt 108.4 lb

## 2020-12-31 DIAGNOSIS — N3001 Acute cystitis with hematuria: Secondary | ICD-10-CM

## 2020-12-31 DIAGNOSIS — I10 Essential (primary) hypertension: Secondary | ICD-10-CM | POA: Diagnosis not present

## 2020-12-31 DIAGNOSIS — R079 Chest pain, unspecified: Secondary | ICD-10-CM | POA: Diagnosis not present

## 2020-12-31 DIAGNOSIS — R0789 Other chest pain: Secondary | ICD-10-CM

## 2020-12-31 DIAGNOSIS — R5383 Other fatigue: Secondary | ICD-10-CM | POA: Diagnosis not present

## 2020-12-31 DIAGNOSIS — F0390 Unspecified dementia without behavioral disturbance: Secondary | ICD-10-CM | POA: Diagnosis not present

## 2020-12-31 DIAGNOSIS — E785 Hyperlipidemia, unspecified: Secondary | ICD-10-CM | POA: Diagnosis not present

## 2020-12-31 LAB — CBC WITH DIFFERENTIAL/PLATELET
Basophils Absolute: 0.1 10*3/uL (ref 0.0–0.2)
Basos: 1 %
EOS (ABSOLUTE): 0.3 10*3/uL (ref 0.0–0.4)
Eos: 3 %
Hematocrit: 35.8 % (ref 34.0–46.6)
Hemoglobin: 11.7 g/dL (ref 11.1–15.9)
Lymphocytes Absolute: 1.5 10*3/uL (ref 0.7–3.1)
Lymphs: 15 %
MCH: 30.9 pg (ref 26.6–33.0)
MCHC: 32.7 g/dL (ref 31.5–35.7)
MCV: 95 fL (ref 79–97)
Monocytes Absolute: 0.9 10*3/uL (ref 0.1–0.9)
Monocytes: 9 %
Neutrophils Absolute: 7.4 10*3/uL — ABNORMAL HIGH (ref 1.4–7.0)
Neutrophils: 72 %
Platelets: 335 10*3/uL (ref 150–450)
RBC: 3.79 x10E6/uL (ref 3.77–5.28)
RDW: 16 % — ABNORMAL HIGH (ref 11.7–15.4)
WBC: 10.1 10*3/uL (ref 3.4–10.8)

## 2020-12-31 LAB — COMPREHENSIVE METABOLIC PANEL
ALT: 54 IU/L — ABNORMAL HIGH (ref 0–32)
AST: 39 IU/L (ref 0–40)
Albumin/Globulin Ratio: 2.2 (ref 1.2–2.2)
Albumin: 4 g/dL (ref 3.7–4.7)
Alkaline Phosphatase: 184 IU/L — ABNORMAL HIGH (ref 44–121)
BUN/Creatinine Ratio: 23 (ref 12–28)
BUN: 22 mg/dL (ref 8–27)
Bilirubin Total: 0.9 mg/dL (ref 0.0–1.2)
CO2: 28 mmol/L (ref 20–29)
Calcium: 9.3 mg/dL (ref 8.7–10.3)
Chloride: 103 mmol/L (ref 96–106)
Creatinine, Ser: 0.97 mg/dL (ref 0.57–1.00)
Globulin, Total: 1.8 g/dL (ref 1.5–4.5)
Glucose: 399 mg/dL — ABNORMAL HIGH (ref 65–99)
Potassium: 4.4 mmol/L (ref 3.5–5.2)
Sodium: 139 mmol/L (ref 134–144)
Total Protein: 5.8 g/dL — ABNORMAL LOW (ref 6.0–8.5)
eGFR: 61 mL/min/{1.73_m2} (ref >59)

## 2020-12-31 LAB — POCT URINALYSIS DIP (CLINITEK)
Bilirubin, UA: NEGATIVE
Glucose, UA: 500 mg/dL — AB
Ketones, POC UA: NEGATIVE mg/dL
Leukocytes, UA: NEGATIVE
Nitrite, UA: NEGATIVE
POC PROTEIN,UA: 30 — AB
Spec Grav, UA: 1.025 (ref 1.010–1.025)
Urobilinogen, UA: 1 E.U./dL
pH, UA: 6 (ref 5.0–8.0)

## 2020-12-31 LAB — TROPONIN T: Troponin T (Highly Sensitive): 22 ng/L (ref 0–14)

## 2020-12-31 MED ORDER — CIPROFLOXACIN HCL 250 MG PO TABS: 250.0000 mg | ORAL_TABLET | Freq: Two times a day (BID) | ORAL | 0 refills | Status: DC

## 2020-12-31 NOTE — Progress Notes (Signed)
Subjective:  Patient ID: Nancy Lang, female    DOB: [birth year]  Age: 75 y.o. MRN: [id number]  Chief Complaint  Patient presents with  . Urinary Tract Infection    HPI  granddaughter brings patient in for complaints of sleeping a lot and dysuria for the past week - pt with history of recurrent utis  Pt does mention she has had some belching and stomach discomfort along with right sided chest pain that started today  Family states that her glucose has been within normal being between 90-120 when being checked   Current Outpatient Medications on File Prior to Visit  Medication Sig Dispense Refill  . Accu-Chek FastClix Lancets MISC     . ascorbic acid (VITAMIN C) 500 MG tablet Take 500 mg by mouth daily.    Marland Kitchen aspirin 81 MG chewable tablet Chew 81 mg by mouth daily.     Marland Kitchen atorvastatin (LIPITOR) 80 MG tablet Take 1 tablet (80 mg total) by mouth daily. PLEASE CALL OFFICE TO SCHEDULE APPOINTMENT FOR FURTHER REFILLS (Patient taking differently: Take 80 mg by mouth at bedtime.) 90 tablet 3  . Calcium-Magnesium-Zinc (CAL-MAG-ZINC PO) Take 1 tablet by mouth daily with breakfast.    . Cholecalciferol (VITAMIN D3) 1000 units CAPS Take 1,000 Units by mouth daily.     Marland Kitchen docusate sodium (COLACE) 100 MG capsule Take 100 mg by mouth 3 (three) times daily.    Marland Kitchen donepezil (ARICEPT) 10 MG tablet Take 10 mg by mouth at bedtime.     . DROPLET PEN NEEDLES 32G X 4 MM MISC USE AS DIRECTED ONE TIME DAILY 100 each 3  . glucose blood (ACCU-CHEK GUIDE) test strip To check glucose tid 300 each 3  . insulin glargine (LANTUS SOLOSTAR) 100 UNIT/ML Solostar Pen ADMINISTER 25 UNITS UNDER THE SKIN EVERY NIGHT AT BEDTIME (Patient taking differently: Inject 25 Units into the skin at bedtime.) 15 pen 3  . levothyroxine (SYNTHROID) 50 MCG tablet TAKE 1 TABLET EVERY DAY 90 tablet 1  . lisinopril (ZESTRIL) 10 MG tablet TAKE 1 TABLET(10 MG) BY MOUTH EVERY DAY (Patient taking differently: Take 10 mg by mouth daily.) 90  tablet 3  . memantine (NAMENDA) 10 MG tablet Take 1 tablet by mouth 2 (two) times daily.    . metoprolol tartrate (LOPRESSOR) 50 MG tablet Take 1 tablet (50 mg total) by mouth 2 (two) times daily. (Patient taking differently: Take 50 mg by mouth in the morning and at bedtime.) 180 tablet 3  . montelukast (SINGULAIR) 10 MG tablet TAKE 1 TABLET EVERY DAY 90 tablet 3  . Multiple Vitamins-Minerals (ONE-A-DAY PROACTIVE 65+) TABS Take 1 tablet by mouth daily with breakfast.    . nitrofurantoin, macrocrystal-monohydrate, (MACROBID) 100 MG capsule TAKE 1 CAPSULE EVERY DAY 90 capsule 0  . Omega-3 1000 MG CAPS Take 1,000 mg by mouth daily.     . pantoprazole (PROTONIX) 40 MG tablet TAKE 1 TABLET EVERY DAY 90 tablet 1  . polyethylene glycol powder (GLYCOLAX/MIRALAX) 17 GM/SCOOP powder Take 17 g by mouth daily. 3350 g 1  . potassium gluconate 595 (99 K) MG TABS tablet Take 595 mg by mouth daily.    . vitamin B-12 (CYANOCOBALAMIN) 1000 MCG tablet Take 1,000 mcg by mouth daily.      No current facility-administered medications on file prior to visit.   Past Medical History:  Diagnosis Date  . Angina pectoris ([place])   . Diabetes mellitus without complication ([place])   . GERD (gastroesophageal reflux disease)   .  Hyperlipidemia   . Hypertension   . Thyroid disease    Past Surgical History:  Procedure Laterality Date  . CARDIAC CATHETERIZATION      Family History  Problem Relation Age of Onset  . Diabetes Mother   . Cancer Mother   . Heart disease Mother   . Heart disease Father   . Alcohol abuse Father    Social History   Socioeconomic History  . Marital status: Widowed    Spouse name: Not on file  . Number of children: Not on file  . Years of education: Not on file  . Highest education level: Not on file  Occupational History  . Occupation: retired  Tobacco Use  . Smoking status: Never Smoker  . Smokeless tobacco: Current User    Types: Snuff  Vaping Use  . Vaping Use: Never used   Substance and Sexual Activity  . Alcohol use: No  . Drug use: No  . Sexual activity: Not on file  Other Topics Concern  . Not on file  Social History Narrative  . Not on file   Social Determinants of Health   Financial Resource Strain: Not on file  Food Insecurity: Not on file  Transportation Needs: Not on file  Physical Activity: Not on file  Stress: Not on file  Social Connections: Not on file    Review of Systems  CONSTITUTIONAL: has had fatigue this week E/N/T: Negative for ear pain, nasal congestion and sore throat.  CARDIOVASCULAR: Negative for  dizziness, palpitations and pedal edema. - has complained of right sided chest pain RESPIRATORY: Negative for recent cough and dyspnea.  GASTROINTESTINAL: some mild gastritis and belching MSK: Negative for arthralgias and myalgias.  INTEGUMENTARY: Negative for rash.  NEUROLOGICAL: Negative for dizziness and headaches.  PSYCHIATRIC: Negative for sleep disturbance and to question depression screen.  Negative for depression, negative for anhedonia.      Objective:  BP 130/70 (BP Location: Right Arm, Patient Position: Sitting, Cuff Size: Normal)   Pulse 64   Temp (!) 97 F (36.1 C) (Temporal)   Ht 5\' 2"  (1.575 m)   Wt 108 lb 6.4 oz (49.2 kg)   SpO2 97%   BMI 19.83 kg/m   BP/Weight 12/31/2020 10/23/2020 97/67/3419  Systolic BP 379 024 097  Diastolic BP 70 68 68  Wt. (Lbs) 108.4 107.6 109  BMI 19.83 19.68 19.94    Physical Exam PHYSICAL EXAM:   VS: BP 130/70 (BP Location: Right Arm, Patient Position: Sitting, Cuff Size: Normal)   Pulse 64   Temp (!) 97 F (36.1 C) (Temporal)   Ht 5\' 2"  (1.575 m)   Wt 108 lb 6.4 oz (49.2 kg)   SpO2 97%   BMI 19.83 kg/m   GEN: Well nourished, well developed, in no acute distress  Cardiac: RRR; no murmurs, rubs, or gallops,no edema -  Respiratory:  normal respiratory rate and pattern with no distress - normal breath sounds with no rales, rhonchi, wheezes or rubs GI: normal bowel  sounds, no masses - minimal tenderness to palpation generalized Skin: warm and dry, no rash  Neuro:  Alert and Oriented x 3, Strength and sensation are intact - CN II-Xii grossly intact Psych: euthymic mood, appropriate affect and demeanor  EKG_ LBBB - no changes from prior 08/2020 Diabetic Foot Exam - Simple   No data filed      Lab Results  Component Value Date   WBC 9.0 10/23/2020   HGB 12.4 10/23/2020   HCT 37.7 10/23/2020  PLT 353 10/23/2020   GLUCOSE 108 (H) 10/23/2020   CHOL 105 10/23/2020   TRIG 121 10/23/2020   HDL 43 10/23/2020   LDLCALC 40 10/23/2020   ALT 24 10/23/2020   AST 22 10/23/2020   NA 144 10/23/2020   K 4.0 10/23/2020   CL 103 10/23/2020   CREATININE 0.72 10/23/2020   BUN 14 10/23/2020   CO2 26 10/23/2020   TSH 1.770 10/23/2020   HGBA1C 7.2 (H) 10/23/2020   MICROALBUR 150 06/12/2020      Assessment & Plan:   1. Acute cystitis with hematuria - POCT URINALYSIS DIP (CLINITEK) - Urine Culture - ciprofloxacin (CIPRO) 250 MG tablet; Take 1 tablet (250 mg total) by mouth 2 (two) times daily.  Dispense: 6 tablet; Refill: 0  2. Other chest pain - EKG 12-Lead - CBC with Differential/Platelet - Comprehensive metabolic panel - Troponin T  3. Other fatigue - CBC with Differential/Platelet - Comprehensive metabolic panel    Meds ordered this encounter  Medications  . ciprofloxacin (CIPRO) 250 MG tablet    Sig: Take 1 tablet (250 mg total) by mouth 2 (two) times daily.    Dispense:  6 tablet    Refill:  0    Order Specific Question:   Supervising Provider    AnswerShelton Silvas    Orders Placed This Encounter  Procedures  . Urine Culture  . CBC with Differential/Platelet  . Comprehensive metabolic panel  . Troponin T  . POCT URINALYSIS DIP (CLINITEK)  . EKG 12-Lead      Follow-up: Return in about 2 weeks (around 01/14/2021).  An After Visit Summary was printed and given to the patient.  Yetta Flock [facility] [phone]

## 2021-01-06 ENCOUNTER — Other Ambulatory Visit: Payer: Self-pay | Admitting: Physician Assistant

## 2021-01-06 DIAGNOSIS — I10 Essential (primary) hypertension: Secondary | ICD-10-CM

## 2021-01-07 ENCOUNTER — Other Ambulatory Visit: Payer: Self-pay | Admitting: Physician Assistant

## 2021-01-07 DIAGNOSIS — N3001 Acute cystitis with hematuria: Secondary | ICD-10-CM

## 2021-01-07 LAB — URINE CULTURE

## 2021-01-07 MED ORDER — CIPROFLOXACIN HCL 250 MG PO TABS: 250.0000 mg | ORAL_TABLET | Freq: Two times a day (BID) | ORAL | 0 refills | Status: DC

## 2021-01-13 ENCOUNTER — Other Ambulatory Visit: Payer: Self-pay | Admitting: Physician Assistant

## 2021-01-14 ENCOUNTER — Ambulatory Visit: Payer: Medicare HMO | Admitting: Physician Assistant

## 2021-01-14 DIAGNOSIS — I10 Essential (primary) hypertension: Secondary | ICD-10-CM | POA: Insufficient documentation

## 2021-01-14 DIAGNOSIS — E079 Disorder of thyroid, unspecified: Secondary | ICD-10-CM | POA: Insufficient documentation

## 2021-01-14 DIAGNOSIS — E785 Hyperlipidemia, unspecified: Secondary | ICD-10-CM | POA: Insufficient documentation

## 2021-01-14 DIAGNOSIS — I209 Angina pectoris, unspecified: Secondary | ICD-10-CM | POA: Insufficient documentation

## 2021-01-14 DIAGNOSIS — E119 Type 2 diabetes mellitus without complications: Secondary | ICD-10-CM | POA: Insufficient documentation

## 2021-01-14 DIAGNOSIS — K219 Gastro-esophageal reflux disease without esophagitis: Secondary | ICD-10-CM | POA: Insufficient documentation

## 2021-01-15 ENCOUNTER — Encounter: Payer: Self-pay | Admitting: Cardiology

## 2021-01-15 ENCOUNTER — Other Ambulatory Visit: Payer: Self-pay

## 2021-01-15 ENCOUNTER — Ambulatory Visit (INDEPENDENT_AMBULATORY_CARE_PROVIDER_SITE_OTHER): Payer: Medicare HMO | Admitting: Cardiology

## 2021-01-15 VITALS — BP 160/90 | HR 98 | Ht 61.0 in | Wt 109.0 lb

## 2021-01-15 DIAGNOSIS — Z794 Long term (current) use of insulin: Secondary | ICD-10-CM | POA: Diagnosis not present

## 2021-01-15 DIAGNOSIS — E119 Type 2 diabetes mellitus without complications: Secondary | ICD-10-CM

## 2021-01-15 DIAGNOSIS — G301 Alzheimer's disease with late onset: Secondary | ICD-10-CM

## 2021-01-15 DIAGNOSIS — I2510 Atherosclerotic heart disease of native coronary artery without angina pectoris: Secondary | ICD-10-CM | POA: Diagnosis not present

## 2021-01-15 DIAGNOSIS — F0280 Dementia in other diseases classified elsewhere without behavioral disturbance: Secondary | ICD-10-CM | POA: Diagnosis not present

## 2021-01-15 DIAGNOSIS — E785 Hyperlipidemia, unspecified: Secondary | ICD-10-CM | POA: Diagnosis not present

## 2021-01-15 MED ORDER — ISOSORBIDE MONONITRATE ER 30 MG PO TB24: 30.0000 mg | ORAL_TABLET | Freq: Every day | ORAL | 1 refills | Status: DC

## 2021-01-15 NOTE — Patient Instructions (Signed)
Medication Instructions:  Your physician has recommended you make the following change in your medication:  START: imdur 30 mg daily   *If you need a refill on your cardiac medications before your next appointment, please call your pharmacy*   Lab Work: None If you have labs (blood work) drawn today and your tests are completely normal, you will receive your results only by: Marland Kitchen MyChart Message (if you have MyChart) OR . A paper copy in the mail If you have any lab test that is abnormal or we need to change your treatment, we will call you to review the results.   Testing/Procedures: None   Follow-Up: At [facility], you and your health needs are our priority.  As part of our continuing mission to provide you with exceptional heart care, we have created designated Provider Care Teams.  These Care Teams include your primary Cardiologist (physician) and Advanced Practice Providers (APPs -  Physician Assistants and Nurse Practitioners) who all work together to provide you with the care you need, when you need it.  We recommend signing up for the patient portal called "MyChart".  Sign up information is provided on this After Visit Summary.  MyChart is used to connect with patients for Virtual Visits (Telemedicine).  Patients are able to view lab/test results, encounter notes, upcoming appointments, etc.  Non-urgent messages can be sent to your provider as well.   To learn more about what you can do with MyChart, go to [email or web address].    Your next appointment:   2 month(s)  The format for your next appointment:   In Person  Provider:   Jenne Campus, MD   Other Instructions  Isosorbide Mononitrate extended-release tablets What is this medicine? ISOSORBIDE MONONITRATE (eye soe SOR bide mon oh NYE trate) is a vasodilator. It relaxes blood vessels, increasing the blood and oxygen supply to your heart. This medicine is used to prevent chest pain caused by angina. It will  not help to stop an episode of chest pain. This medicine may be used for other purposes; ask your health care provider or pharmacist if you have questions. COMMON BRAND NAME(S): Imdur, Isotrate ER What should I tell my health care provider before I take this medicine? They need to know if you have any of these conditions:  previous heart attack or heart failure  an unusual or allergic reaction to isosorbide mononitrate, nitrates, other medicines, foods, dyes, or preservatives  pregnant or trying to get pregnant  breast-feeding How should I use this medicine? Take this medicine by mouth with a glass of water. Follow the directions on the prescription label. Do not crush or chew. Take your medicine at regular intervals. Do not take your medicine more often than directed. Do not stop taking this medicine except on the advice of your doctor or health care professional. Talk to your pediatrician regarding the use of this medicine in children. Special care may be needed. Overdosage: If you think you have taken too much of this medicine contact a poison control center or emergency room at once. NOTE: This medicine is only for you. Do not share this medicine with others. What if I miss a dose? If you miss a dose, take it as soon as you can. If it is almost time for your next dose, take only that dose. Do not take double or extra doses. What may interact with this medicine? Do not take this medicine with any of the following medications:  medicines used to treat erectile dysfunction (  ED) like avanafil, sildenafil, tadalafil, and vardenafil  riociguat This medicine may also interact with the following medications:  medicines for high blood pressure  other medicines for angina or heart failure This list may not describe all possible interactions. Give your health care provider a list of all the medicines, herbs, non-prescription drugs, or dietary supplements you use. Also tell them if you smoke,  drink alcohol, or use illegal drugs. Some items may interact with your medicine. What should I watch for while using this medicine? Check your heart rate and blood pressure regularly while you are taking this medicine. Ask your doctor or health care professional what your heart rate and blood pressure should be and when you should contact him or her. Tell your doctor or health care professional if you feel your medicine is no longer working. You may get dizzy. Do not drive, use machinery, or do anything that needs mental alertness until you know how this medicine affects you. To reduce the risk of dizzy or fainting spells, do not sit or stand up quickly, especially if you are an older patient. Alcohol can make you more dizzy, and increase flushing and rapid heartbeats. Avoid alcoholic drinks. Do not treat yourself for coughs, colds, or pain while you are taking this medicine without asking your doctor or health care professional for advice. Some ingredients may increase your blood pressure. What side effects may I notice from receiving this medicine? Side effects that you should report to your doctor or health care professional as soon as possible:  bluish discoloration of lips, fingernails, or palms of hands  irregular heartbeat, palpitations  low blood pressure  nausea, vomiting  persistent headache  unusually weak or tired Side effects that usually do not require medical attention (report to your doctor or health care professional if they continue or are bothersome):  flushing of the face or neck  rash This list may not describe all possible side effects. Call your doctor for medical advice about side effects. You may report side effects to FDA at 1-800-FDA-1088. Where should I keep my medicine? Keep out of the reach of children. Store between 15 and 30 degrees C (59 and 86 degrees F). Keep container tightly closed. Throw away any unused medicine after the expiration date. NOTE: This  sheet is a summary. It may not cover all possible information. If you have questions about this medicine, talk to your doctor, pharmacist, or health care provider.  2021 Elsevier/Gold Standard (2013-08-11 14:48:19)

## 2021-01-15 NOTE — Progress Notes (Signed)
Cardiology Office Note:    Date:  01/15/2021   ID:  Nancy Lang, [place] October 17, 1946, MRN [id number]  PCP:  Marge Duncans, PA-C  Cardiologist:  Jenne Campus, MD    Referring MD: Marge Duncans, PA-C   Chief Complaint  Patient presents with  . Hospitalization Follow-up    History of Present Illness:    Nancy Lang is a 75 y.o. female with past medical history significant for coronary artery disease, she did have PTCA and drug-eluting stent to the mid LAD in May 2018.  Prior of that she did have stent to the circumflex artery.  She also have dementia, she will start hypertension, diabetes, dyslipidemia. She comes today to my office with her daughter.  Recently she had a being in the emergency because of atypical chest pain.  Because of her dementia is really very difficult to get story from her.  She burps a lot and her daughter said anytime she eats she will get some hurting in the chest.  She was in the emergency room EKG did not show any acute changes, biochemical markers were negative.  She does not do much she sits in at home and walks some but not much.  Past Medical History:  Diagnosis Date  . Acquired hypothyroidism 12/31/2015  . Acute cystitis without hematuria 06/12/2020  . Acute laryngopharyngitis 05/23/2020  . Angina pectoris ([place])   . 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 Coronary artery disease involving native coronary artery of native heart 10/07/2016   Overview:  PTCA and drug-eluting stents to mid proximal portion to LAD and mid and proximal portion of circumflex in 2017 Drug-eluting stent mid LAD in May 2018  . Dementia without behavioral disturbance ([place]) 03/10/2017  . Diabetes mellitus without complication ([place])   . Dyslipidemia (high LDL; low HDL) 10/07/2016  . Essential hypertension, benign 10/07/2016  . GERD  (gastroesophageal reflux disease)   . Hyperlipidemia   . Hypertension   . Insulin dependent type 2 diabetes mellitus, controlled ([place]) 10/07/2016  . Memory impairment 03/10/2017  . Mixed hyperlipidemia 12/06/2019  . NSTEMI (non-ST elevated myocardial infarction) ([place]) 09/07/2016  . Thyroid disease   . Vitamin D deficiency 03/08/2017    Past Surgical History:  Procedure Laterality Date  . CARDIAC CATHETERIZATION      Current Medications: Current Meds  Medication Sig  . Accu-Chek FastClix Lancets MISC   . ACCU-CHEK GUIDE test strip USE TO CHECK BLOOD GLUCOSE THREE TIMES DAILY  . ascorbic acid (VITAMIN C) 500 MG tablet Take 500 mg by mouth daily.  Marland Kitchen aspirin 81 MG chewable tablet Chew 81 mg by mouth daily.   Marland Kitchen atorvastatin (LIPITOR) 80 MG tablet Take 1 tablet (80 mg total) by mouth daily. PLEASE CALL OFFICE TO SCHEDULE APPOINTMENT FOR FURTHER REFILLS (Patient taking differently: Take 80 mg by mouth at bedtime.)  . Calcium-Magnesium-Zinc (CAL-MAG-ZINC PO) Take 1 tablet by mouth daily with breakfast.  . Cholecalciferol (VITAMIN D3) 1000 units CAPS Take 1,000 Units by mouth daily.   . ciprofloxacin (CIPRO) 250 MG tablet Take 1 tablet (250 mg total) by mouth 2 (two) times daily.  Marland Kitchen docusate sodium (COLACE) 100 MG capsule Take 100 mg by mouth 3 (three) times daily.  Marland Kitchen donepezil (ARICEPT) 10 MG tablet Take 10 mg by mouth at bedtime.   . DROPLET PEN NEEDLES 32G X 4 MM MISC  USE AS DIRECTED ONE TIME DAILY  . insulin glargine (LANTUS SOLOSTAR) 100 UNIT/ML Solostar Pen ADMINISTER 25 UNITS UNDER THE SKIN EVERY NIGHT AT BEDTIME (Patient taking differently: Inject 25 Units into the skin at bedtime.)  . isosorbide mononitrate (IMDUR) 30 MG 24 hr tablet Take 1 tablet (30 mg total) by mouth daily.  Marland Kitchen levothyroxine (SYNTHROID) 50 MCG tablet TAKE 1 TABLET EVERY DAY  . lisinopril (ZESTRIL) 10 MG tablet TAKE 1 TABLET EVERY DAY  . memantine (NAMENDA) 10 MG tablet Take 1 tablet by mouth 2 (two) times daily.  .  metoprolol tartrate (LOPRESSOR) 50 MG tablet TAKE 1 TABLET TWICE DAILY  . montelukast (SINGULAIR) 10 MG tablet TAKE 1 TABLET EVERY DAY  . Multiple Vitamins-Minerals (ONE-A-DAY PROACTIVE 65+) TABS Take 1 tablet by mouth daily with breakfast.  . nitrofurantoin, macrocrystal-monohydrate, (MACROBID) 100 MG capsule TAKE 1 CAPSULE EVERY DAY  . Omega-3 1000 MG CAPS Take 1,000 mg by mouth daily.   . pantoprazole (PROTONIX) 40 MG tablet TAKE 1 TABLET EVERY DAY  . polyethylene glycol powder (GLYCOLAX/MIRALAX) 17 GM/SCOOP powder Take 17 g by mouth daily.  . potassium gluconate 595 (99 K) MG TABS tablet Take 595 mg by mouth daily.  . vitamin B-12 (CYANOCOBALAMIN) 1000 MCG tablet Take 1,000 mcg by mouth daily.      Allergies:   Pravastatin, Ativan [lorazepam], Fenofibrate, Oxytetracycline, Penicillins, Septra [sulfamethoxazole-trimethoprim], Sulfa antibiotics, and Tetracyclines & related   Social History   Socioeconomic History  . Marital status: Widowed    Spouse name: Not on file  . Number of children: Not on file  . Years of education: Not on file  . Highest education level: Not on file  Occupational History  . Occupation: retired  Tobacco Use  . Smoking status: Never Smoker  . Smokeless tobacco: Current User    Types: Snuff  Vaping Use  . Vaping Use: Never used  Substance and Sexual Activity  . Alcohol use: No  . Drug use: No  . Sexual activity: Not on file  Other Topics Concern  . Not on file  Social History Narrative  . Not on file   Social Determinants of Health   Financial Resource Strain: Not on file  Food Insecurity: Not on file  Transportation Needs: Not on file  Physical Activity: Not on file  Stress: Not on file  Social Connections: Not on file     Family History: The patient's family history includes Alcohol abuse in her father; Cancer in her mother; Diabetes in her mother; Heart disease in her father and mother. ROS:   Please see the history of present illness.     All 14 point review of systems negative except as described per history of present illness  EKGs/Labs/Other Studies Reviewed:      Recent Labs: 10/23/2020: TSH 1.770 12/31/2020: ALT 54; BUN 22; Creatinine, Ser 0.97; Hemoglobin 11.7; Platelets 335; Potassium 4.4; Sodium 139  Recent Lipid Panel    Component Value Date/Time   CHOL 105 10/23/2020 1109   TRIG 121 10/23/2020 1109   HDL 43 10/23/2020 1109   CHOLHDL 2.4 10/23/2020 1109   LDLCALC 40 10/23/2020 1109    Physical Exam:    VS:  BP (!) 160/90 (BP Location: Left Arm, Patient Position: Sitting)   Pulse 98   Ht 5\' 1"  (1.549 m)   Wt 109 lb (49.4 kg)   SpO2 95%   BMI 20.60 kg/m     Wt Readings from Last 3 Encounters:  01/15/21 109 lb (49.4 kg)  12/31/20 108 lb 6.4 oz (49.2 kg)  10/23/20 107 lb 9.6 oz (48.8 kg)     GEN:  Well nourished, well developed in no acute distress HEENT: Normal NECK: No JVD; No carotid bruits LYMPHATICS: No lymphadenopathy CARDIAC: RRR, no murmurs, no rubs, no gallops RESPIRATORY:  Clear to auscultation without rales, wheezing or rhonchi  ABDOMEN: Soft, non-tender, non-distended MUSCULOSKELETAL:  No edema; No deformity  SKIN: Warm and dry LOWER EXTREMITIES: no swelling NEUROLOGIC:  Alert and oriented x 3 PSYCHIATRIC:  Normal affect   ASSESSMENT:    1. Coronary artery disease involving native coronary artery of native heart without angina pectoris   2. Insulin dependent type 2 diabetes mellitus, controlled ([place])   3. Late onset Alzheimer's dementia without behavioral disturbance ([place])   4. Dyslipidemia (high LDL; low HDL)    PLAN:    In order of problems listed above:  1. Coronary disease status post PTCA and stenting last time in 2019.  Does have some symptoms which are difficult to read because of her dementia.  Because of her dementia I do not think she would be able to call for a stress test.  I will try to manage this with medications.  I will give her Imdur 30 mg daily.  We will  continue rest of the medication. 2. Essential hypertension: Uncontrolled.  Hopefully will be better with addition of Imdur. 3. Diabetes that being followed by internal medicine team.  I did review K PN October 23, 2020 her hemoglobin A1c was 7.2. 4. Dyslipidemia LDL 40 HDL 40 3K PN from 29 December last year.  We will continue present management which include high intensity statin in form of Lipitor 80.   Medication Adjustments/Labs and Tests Ordered: Current medicines are reviewed at length with the patient today.  Concerns regarding medicines are outlined above.  No orders of the defined types were placed in this encounter.  Medication changes:  Meds ordered this encounter  Medications  . isosorbide mononitrate (IMDUR) 30 MG 24 hr tablet    Sig: Take 1 tablet (30 mg total) by mouth daily.    Dispense:  90 tablet    Refill:  1    Signed, Park Liter, MD, [facility] 01/15/2021 9:54 AM    [place]

## 2021-01-18 ENCOUNTER — Other Ambulatory Visit: Payer: Self-pay | Admitting: Physician Assistant

## 2021-01-20 ENCOUNTER — Ambulatory Visit (INDEPENDENT_AMBULATORY_CARE_PROVIDER_SITE_OTHER): Payer: Medicare HMO | Admitting: Physician Assistant

## 2021-01-20 ENCOUNTER — Encounter: Payer: Self-pay | Admitting: Physician Assistant

## 2021-01-20 ENCOUNTER — Other Ambulatory Visit: Payer: Self-pay

## 2021-01-20 VITALS — BP 180/70 | HR 57 | Temp 97.5°F | Resp 16 | Ht 59.5 in | Wt 108.0 lb

## 2021-01-20 DIAGNOSIS — N3000 Acute cystitis without hematuria: Secondary | ICD-10-CM

## 2021-01-20 DIAGNOSIS — I10 Essential (primary) hypertension: Secondary | ICD-10-CM | POA: Diagnosis not present

## 2021-01-20 DIAGNOSIS — R5381 Other malaise: Secondary | ICD-10-CM

## 2021-01-20 HISTORY — DX: Other malaise: R53.81

## 2021-01-20 LAB — POCT URINALYSIS DIP (CLINITEK)
Bilirubin, UA: NEGATIVE
Blood, UA: NEGATIVE
Glucose, UA: NEGATIVE mg/dL
Ketones, POC UA: NEGATIVE mg/dL
Nitrite, UA: NEGATIVE
POC PROTEIN,UA: 100 — AB
Spec Grav, UA: >=1.03 — AB (ref 1.010–1.025)
Urobilinogen, UA: 1 E.U./dL
pH, UA: 5 (ref 5.0–8.0)

## 2021-01-20 MED ORDER — CIPROFLOXACIN HCL 500 MG PO TABS: 500.0000 mg | ORAL_TABLET | Freq: Two times a day (BID) | ORAL | 0 refills | Status: DC

## 2021-01-20 NOTE — Progress Notes (Signed)
Acute Office Visit  Subjective:    Patient ID: Nancy Lang, female    DOB: 06-Nov-1945, 75 y.o.   MRN: [id number]  Chief Complaint  Patient presents with  . Fatigue    Since 3 days ago.    HPI Patient is in today for follow up of uti - grand daughter states she did finish her cipro as directed and felt good for a few days but urine urgency and dysuria have recurred  Pt with history of CAD and hypertension - recently saw cardiologist and was placed on Imdur 30mg  qd however that was sent to a mail in pharmacy and they just received in mail today ---   Pt has had some mild malaise for the past few days  Past Medical History:  Diagnosis Date  . Acquired hypothyroidism 12/31/2015  . Acute cystitis without hematuria 06/12/2020  . Acute laryngopharyngitis 05/23/2020  . Angina pectoris ([place])   . 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 Coronary artery disease involving native coronary artery of native heart 10/07/2016   Overview:  PTCA and drug-eluting stents to mid proximal portion to LAD and mid and proximal portion of circumflex in 2017 Drug-eluting stent mid LAD in May 2018  . Dementia without behavioral disturbance ([place]) 03/10/2017  . Diabetes mellitus without complication ([place])   . Dyslipidemia (high LDL; low HDL) 10/07/2016  . Essential hypertension, benign 10/07/2016  . GERD (gastroesophageal reflux disease)   . Hyperlipidemia   . Hypertension   . Insulin dependent type 2 diabetes mellitus, controlled ([place]) 10/07/2016  . Memory impairment 03/10/2017  . Mixed hyperlipidemia 12/06/2019  . NSTEMI (non-ST elevated myocardial infarction) ([place]) 09/07/2016  . Thyroid disease   . Vitamin D deficiency 03/08/2017    Past Surgical History:  Procedure Laterality Date  . CARDIAC CATHETERIZATION      Family History  Problem Relation Age of Onset  .  Diabetes Mother   . Cancer Mother   . Heart disease Mother   . Heart disease Father   . Alcohol abuse Father     Social History   Socioeconomic History  . Marital status: Widowed    Spouse name: Not on file  . Number of children: Not on file  . Years of education: Not on file  . Highest education level: Not on file  Occupational History  . Occupation: retired  Tobacco Use  . Smoking status: Never Smoker  . Smokeless tobacco: Current User    Types: Snuff  . Tobacco comment: one can a week  Vaping Use  . Vaping Use: Never used  Substance and Sexual Activity  . Alcohol use: No  . Drug use: No  . Sexual activity: Not Currently  Other Topics Concern  . Not on file  Social History Narrative  . Not on file   Social Determinants of Health   Financial Resource Strain: Not on file  Food Insecurity: Not on file  Transportation Needs: Not on file  Physical Activity: Not on file  Stress: Not on file  Social Connections: Not on file  Intimate Partner Violence: Not on file    Outpatient Medications Prior to Visit  Medication Sig Dispense Refill  . Accu-Chek FastClix Lancets MISC Just Lancets Unknown size    . ascorbic acid (VITAMIN C) 500 MG tablet Take 500 mg by mouth daily.    Marland Kitchen  aspirin 81 MG chewable tablet Chew 81 mg by mouth daily.     Marland Kitchen atorvastatin (LIPITOR) 80 MG tablet Take 1 tablet (80 mg total) by mouth daily. PLEASE CALL OFFICE TO SCHEDULE APPOINTMENT FOR FURTHER REFILLS (Patient taking differently: Take 80 mg by mouth at bedtime.) 90 tablet 3  . Calcium-Magnesium-Zinc (CAL-MAG-ZINC PO) Take 1 tablet by mouth daily with breakfast. Unknown strength per patient    . Cholecalciferol (VITAMIN D3) 1000 units CAPS Take 1,000 Units by mouth daily.     Marland Kitchen docusate sodium (COLACE) 100 MG capsule Take 100 mg by mouth 3 (three) times daily.    Marland Kitchen donepezil (ARICEPT) 10 MG tablet Take 10 mg by mouth at bedtime.     . DROPLET PEN NEEDLES 32G X 4 MM MISC USE AS DIRECTED ONE TIME  DAILY (Patient taking differently: Needles unknown size per patient) 100 each 3  . glucose blood (ACCU-CHEK GUIDE) test strip CHECK BLOOD GLUCOSE THREE TIMES DAILY 300 strip 1  . insulin glargine (LANTUS SOLOSTAR) 100 UNIT/ML Solostar Pen ADMINISTER 25 UNITS UNDER THE SKIN EVERY NIGHT AT BEDTIME (Patient taking differently: Inject 25 Units into the skin at bedtime.) 15 pen 3  . isosorbide mononitrate (IMDUR) 30 MG 24 hr tablet Take 1 tablet (30 mg total) by mouth daily. 90 tablet 1  . levothyroxine (SYNTHROID) 50 MCG tablet TAKE 1 TABLET EVERY DAY (Patient taking differently: Take 50 mcg by mouth daily before breakfast.) 90 tablet 1  . lisinopril (ZESTRIL) 10 MG tablet TAKE 1 TABLET EVERY DAY (Patient taking differently: Take 10 mg by mouth daily. TAKE 1 TABLET EVERY DAY) 90 tablet 0  . memantine (NAMENDA) 10 MG tablet Take 1 tablet by mouth 2 (two) times daily.    . metoprolol tartrate (LOPRESSOR) 50 MG tablet TAKE 1 TABLET TWICE DAILY (Patient taking differently: Take 50 mg by mouth 2 (two) times daily.) 180 tablet 0  . montelukast (SINGULAIR) 10 MG tablet TAKE 1 TABLET EVERY DAY (Patient taking differently: Take 10 mg by mouth 2 (two) times daily.) 90 tablet 3  . Multiple Vitamins-Minerals (ONE-A-DAY PROACTIVE 65+) TABS Take 1 tablet by mouth daily with breakfast. Unknown strength per patient    . nitrofurantoin, macrocrystal-monohydrate, (MACROBID) 100 MG capsule TAKE 1 CAPSULE EVERY DAY (Patient taking differently: Take 100 mg by mouth daily with breakfast.) 90 capsule 0  . Omega-3 1000 MG CAPS Take 1,000 mg by mouth daily.     . pantoprazole (PROTONIX) 40 MG tablet TAKE 1 TABLET EVERY DAY (Patient taking differently: Take 40 mg by mouth daily.) 90 tablet 1  . polyethylene glycol powder (GLYCOLAX/MIRALAX) 17 GM/SCOOP powder Take 17 g by mouth daily. 3350 g 1  . potassium gluconate 595 (99 K) MG TABS tablet Take 595 mg by mouth daily.    . vitamin B-12 (CYANOCOBALAMIN) 1000 MCG tablet Take 1,000  mcg by mouth daily.     . ciprofloxacin (CIPRO) 250 MG tablet Take 1 tablet (250 mg total) by mouth 2 (two) times daily. 14 tablet 0   No facility-administered medications prior to visit.    Allergies  Allergen Reactions  . Pravastatin     ANGER AND MEMORY ISSUES  . Ativan [Lorazepam] Anxiety and Other (See Comments)    Made the patient "paranoid and mean" (per family)- also worsened anxiety  . Fenofibrate Rash  . Oxytetracycline Rash  . Penicillins Rash  . Septra [Sulfamethoxazole-Trimethoprim] Rash  . Sulfa Antibiotics Rash  . Tetracyclines & Related Rash    Review of Systems  CONSTITUTIONAL: see HPI E/N/T: Negative for ear pain, nasal congestion and sore throat.  CARDIOVASCULAR: Negative for chest pain, dizziness, palpitations and pedal edema.  RESPIRATORY: Negative for recent cough and dyspnea.  GASTROINTESTINAL: Negative for abdominal pain, acid reflux symptoms, constipation, diarrhea, nausea and vomiting.  GU - see HPI MSK: Negative for arthralgias and myalgias.  INTEGUMENTARY: Negative for rash.  NEUROLOGICAL: Negative for dizziness and headaches.  PSYCHIATRIC: Negative for sleep disturbance and to question depression screen.  Negative for depression, negative for anhedonia.         Objective:    Physical Exam PHYSICAL EXAM:   VS: BP (!) 180/70   Pulse (!) 57   Temp (!) 97.5 F (36.4 C)   Resp 16   Ht 4' 11.5" (1.511 m)   Wt 108 lb (49 kg)   SpO2 98%   BMI 21.45 kg/m   GEN: Well nourished, well developed, in no acute distress  HEENT: normal external ears and nose -- Lips, Teeth and Gums - normal  Oropharynx - normal mucosa, palate, and posterior pharynx Cardiac: RRR; no murmurs, rubs, or gallops,no edema - Respiratory:  normal respiratory rate and pattern with no distress - normal breath sounds with no rales, rhonchi, wheezes or rubs GI: normal bowel sounds, no masses or tenderness  Office Visit on 01/20/2021  Component Date Value Ref Range Status  .  Glucose, UA 01/20/2021 negative  negative mg/dL Final  . Bilirubin, UA 01/20/2021 negative  negative Final  . Ketones, POC UA 01/20/2021 negative  negative mg/dL Final  . Spec Grav, UA 01/20/2021 >=1.030* 1.010 - 1.025 Final  . Blood, UA 01/20/2021 negative  negative Final  . pH, UA 01/20/2021 5.0  5.0 - 8.0 Final  . POC PROTEIN,UA 01/20/2021 =100* negative, trace Final  . Urobilinogen, UA 01/20/2021 1.0  0.2 or 1.0 E.U./dL Final  . Nitrite, UA 01/20/2021 Negative  Negative Final  . Leukocytes, UA 01/20/2021 Trace* Negative Final     BP (!) 180/70   Pulse (!) 57   Temp (!) 97.5 F (36.4 C)   Resp 16   Ht 4' 11.5" (1.511 m)   Wt 108 lb (49 kg)   SpO2 98%   BMI 21.45 kg/m  Wt Readings from Last 3 Encounters:  01/20/21 108 lb (49 kg)  01/15/21 109 lb (49.4 kg)  12/31/20 108 lb 6.4 oz (49.2 kg)    There are no preventive care reminders to display for this patient.  There are no preventive care reminders to display for this patient.   Lab Results  Component Value Date   TSH 1.770 10/23/2020   Lab Results  Component Value Date   WBC 10.1 12/31/2020   HGB 11.7 12/31/2020   HCT 35.8 12/31/2020   MCV 95 12/31/2020   PLT 335 12/31/2020   Lab Results  Component Value Date   NA 139 12/31/2020   K 4.4 12/31/2020   CO2 28 12/31/2020   GLUCOSE 399 (H) 12/31/2020   BUN 22 12/31/2020   CREATININE 0.97 12/31/2020   BILITOT 0.9 12/31/2020   ALKPHOS 184 (H) 12/31/2020   AST 39 12/31/2020   ALT 54 (H) 12/31/2020   PROT 5.8 (L) 12/31/2020   ALBUMIN 4.0 12/31/2020   CALCIUM 9.3 12/31/2020   Lab Results  Component Value Date   CHOL 105 10/23/2020   Lab Results  Component Value Date   HDL 43 10/23/2020   Lab Results  Component Value Date   LDLCALC 40 10/23/2020   Lab Results  Component Value Date   TRIG 121 10/23/2020   Lab Results  Component Value Date   CHOLHDL 2.4 10/23/2020   Lab Results  Component Value Date   HGBA1C 7.2 (H) 10/23/2020        Assessment & Plan:  1. Acute cystitis without hematuria - Urine Culture - POCT URINALYSIS DIP (CLINITEK)  2. Essential hypertension, benign - CBC with Differential/Platelet - Comprehensive metabolic panel  3. Malaise - CBC with Differential/Platelet - Comprehensive metabolic panel  take imdur as directed and follow up to recheck bp in 2 weeks Meds ordered this encounter  Medications  . ciprofloxacin (CIPRO) 500 MG tablet    Sig: Take 1 tablet (500 mg total) by mouth 2 (two) times daily.    Dispense:  20 tablet    Refill:  0    Order Specific Question:   Supervising Provider    AnswerShelton Silvas    Orders Placed This Encounter  Procedures  . Urine Culture  . CBC with Differential/Platelet  . Comprehensive metabolic panel  . POCT URINALYSIS DIP (CLINITEK)      Follow-up: Return in about 2 weeks (around 02/03/2021) for follow up.  An After Visit Summary was printed and given to the patient.  Yetta Flock [facility] [phone]

## 2021-01-21 LAB — COMPREHENSIVE METABOLIC PANEL
ALT: 21 IU/L (ref 0–32)
AST: 19 IU/L (ref 0–40)
Albumin/Globulin Ratio: 2 (ref 1.2–2.2)
Albumin: 4.6 g/dL (ref 3.7–4.7)
Alkaline Phosphatase: 135 IU/L — ABNORMAL HIGH (ref 44–121)
BUN/Creatinine Ratio: 24 (ref 12–28)
BUN: 19 mg/dL (ref 8–27)
Bilirubin Total: 1.2 mg/dL (ref 0.0–1.2)
CO2: 23 mmol/L (ref 20–29)
Calcium: 9.8 mg/dL (ref 8.7–10.3)
Chloride: 103 mmol/L (ref 96–106)
Creatinine, Ser: 0.79 mg/dL (ref 0.57–1.00)
Globulin, Total: 2.3 g/dL (ref 1.5–4.5)
Glucose: 161 mg/dL — ABNORMAL HIGH (ref 65–99)
Potassium: 3.8 mmol/L (ref 3.5–5.2)
Sodium: 145 mmol/L — ABNORMAL HIGH (ref 134–144)
Total Protein: 6.9 g/dL (ref 6.0–8.5)
eGFR: 78 mL/min/{1.73_m2} (ref >59)

## 2021-01-21 LAB — CBC WITH DIFFERENTIAL/PLATELET
Basophils Absolute: 0.1 10*3/uL (ref 0.0–0.2)
Basos: 1 %
EOS (ABSOLUTE): 0.2 10*3/uL (ref 0.0–0.4)
Eos: 2 %
Hematocrit: 38.4 % (ref 34.0–46.6)
Hemoglobin: 12.8 g/dL (ref 11.1–15.9)
Immature Grans (Abs): 0 10*3/uL (ref 0.0–0.1)
Immature Granulocytes: 0 %
Lymphocytes Absolute: 1.9 10*3/uL (ref 0.7–3.1)
Lymphs: 18 %
MCH: 31.6 pg (ref 26.6–33.0)
MCHC: 33.3 g/dL (ref 31.5–35.7)
MCV: 95 fL (ref 79–97)
Monocytes Absolute: 0.7 10*3/uL (ref 0.1–0.9)
Monocytes: 7 %
Neutrophils Absolute: 7.4 10*3/uL — ABNORMAL HIGH (ref 1.4–7.0)
Neutrophils: 72 %
Platelets: 360 10*3/uL (ref 150–450)
RBC: 4.05 x10E6/uL (ref 3.77–5.28)
RDW: 13.9 % (ref 11.7–15.4)
WBC: 10.3 10*3/uL (ref 3.4–10.8)

## 2021-01-21 LAB — URINE CULTURE: Organism ID, Bacteria: NO GROWTH

## 2021-01-23 ENCOUNTER — Ambulatory Visit: Payer: Medicare HMO | Admitting: Physician Assistant

## 2021-01-28 ENCOUNTER — Telehealth: Payer: Self-pay | Admitting: Physician Assistant

## 2021-01-28 NOTE — Progress Notes (Signed)
  Chronic Care Management   Outreach Note  01/28/2021 Name: Harlie Buening MRN: [id number] DOB: [birth year]  Referred by: Marge Duncans, PA-C Reason for referral : No chief complaint on file.   An unsuccessful telephone outreach was attempted today. The patient was referred to the pharmacist for assistance with care management and care coordination.   Follow Up Plan:   Carley Perdue [employer] Scheduler

## 2021-01-31 ENCOUNTER — Telehealth: Payer: Self-pay | Admitting: Physician Assistant

## 2021-01-31 NOTE — Progress Notes (Signed)
  Chronic Care Management   Outreach Note  01/31/2021 Name: Marivel Mcclarty MRN: [id number] DOB: 08-Oct-1946  Referred by: Marge Duncans, PA-C Reason for referral : No chief complaint on file.   A second unsuccessful telephone outreach was attempted today. The patient was referred to pharmacist for assistance with care management and care coordination.  Follow Up Plan:   Carley Perdue [employer] Scheduler

## 2021-02-06 ENCOUNTER — Ambulatory Visit: Payer: Medicare HMO | Admitting: Physician Assistant

## 2021-02-07 ENCOUNTER — Other Ambulatory Visit: Payer: Self-pay

## 2021-02-07 ENCOUNTER — Ambulatory Visit (INDEPENDENT_AMBULATORY_CARE_PROVIDER_SITE_OTHER): Payer: Medicare HMO | Admitting: Physician Assistant

## 2021-02-07 ENCOUNTER — Encounter: Payer: Self-pay | Admitting: Physician Assistant

## 2021-02-07 VITALS — BP 132/64 | HR 63 | Temp 97.3°F | Ht 59.5 in | Wt 107.0 lb

## 2021-02-07 DIAGNOSIS — N3000 Acute cystitis without hematuria: Secondary | ICD-10-CM

## 2021-02-07 DIAGNOSIS — I10 Essential (primary) hypertension: Secondary | ICD-10-CM

## 2021-02-07 LAB — POCT URINALYSIS DIPSTICK
Bilirubin, UA: NEGATIVE
Blood, UA: NEGATIVE
Glucose, UA: POSITIVE — AB
Ketones, UA: NEGATIVE
Leukocytes, UA: NEGATIVE
Nitrite, UA: NEGATIVE
Protein, UA: POSITIVE — AB
Spec Grav, UA: 1.02 (ref 1.010–1.025)
Urobilinogen, UA: 0.2 E.U./dL
pH, UA: 6.5 (ref 5.0–8.0)

## 2021-02-07 NOTE — Progress Notes (Signed)
Subjective:  Patient ID: Nancy Lang, female    DOB: Oct 31, 1945  Age: 75 y.o. MRN: [id number]  Chief Complaint  Patient presents with  . Urinary Tract Infection    2 week f/u    HPI  pt with history of uti - has finished her medication and here today for recheck - she states that every once in awhile she had some burning since finishing medication but none now - denies fever/malaise  At last visit bp was elevated but had just gotten rx for imdur - she is now taking regularly and bp doing much better -- denies chest pain/sob Current Outpatient Medications on File Prior to Visit  Medication Sig Dispense Refill  . Accu-Chek FastClix Lancets MISC Just Lancets Unknown size    . ascorbic acid (VITAMIN C) 500 MG tablet Take 500 mg by mouth daily.    Marland Kitchen aspirin 81 MG chewable tablet Chew 81 mg by mouth daily.     Marland Kitchen atorvastatin (LIPITOR) 80 MG tablet Take 1 tablet (80 mg total) by mouth daily. PLEASE CALL OFFICE TO SCHEDULE APPOINTMENT FOR FURTHER REFILLS (Patient taking differently: Take 80 mg by mouth at bedtime.) 90 tablet 3  . Calcium-Magnesium-Zinc (CAL-MAG-ZINC PO) Take 1 tablet by mouth daily with breakfast. Unknown strength per patient    . Cholecalciferol (VITAMIN D3) 1000 units CAPS Take 1,000 Units by mouth daily.     Marland Kitchen docusate sodium (COLACE) 100 MG capsule Take 100 mg by mouth 3 (three) times daily.    Marland Kitchen donepezil (ARICEPT) 10 MG tablet Take 10 mg by mouth at bedtime.     . DROPLET PEN NEEDLES 32G X 4 MM MISC USE AS DIRECTED ONE TIME DAILY (Patient taking differently: Needles unknown size per patient) 100 each 3  . glucose blood (ACCU-CHEK GUIDE) test strip CHECK BLOOD GLUCOSE THREE TIMES DAILY 300 strip 1  . insulin glargine (LANTUS SOLOSTAR) 100 UNIT/ML Solostar Pen ADMINISTER 25 UNITS UNDER THE SKIN EVERY NIGHT AT BEDTIME (Patient taking differently: Inject 25 Units into the skin at bedtime.) 15 pen 3  . isosorbide mononitrate (IMDUR) 30 MG 24 hr tablet Take 1 tablet (30 mg  total) by mouth daily. 90 tablet 1  . levothyroxine (SYNTHROID) 50 MCG tablet TAKE 1 TABLET EVERY DAY (Patient taking differently: Take 50 mcg by mouth daily before breakfast.) 90 tablet 1  . lisinopril (ZESTRIL) 10 MG tablet TAKE 1 TABLET EVERY DAY (Patient taking differently: Take 10 mg by mouth daily. TAKE 1 TABLET EVERY DAY) 90 tablet 0  . memantine (NAMENDA) 10 MG tablet Take 1 tablet by mouth 2 (two) times daily.    . metoprolol tartrate (LOPRESSOR) 50 MG tablet TAKE 1 TABLET TWICE DAILY (Patient taking differently: Take 50 mg by mouth 2 (two) times daily.) 180 tablet 0  . montelukast (SINGULAIR) 10 MG tablet TAKE 1 TABLET EVERY DAY (Patient taking differently: Take 10 mg by mouth 2 (two) times daily.) 90 tablet 3  . Multiple Vitamins-Minerals (ONE-A-DAY PROACTIVE 65+) TABS Take 1 tablet by mouth daily with breakfast. Unknown strength per patient    . nitrofurantoin, macrocrystal-monohydrate, (MACROBID) 100 MG capsule TAKE 1 CAPSULE EVERY DAY (Patient taking differently: Take 100 mg by mouth daily with breakfast.) 90 capsule 0  . Omega-3 1000 MG CAPS Take 1,000 mg by mouth daily.     . pantoprazole (PROTONIX) 40 MG tablet TAKE 1 TABLET EVERY DAY (Patient taking differently: Take 40 mg by mouth daily.) 90 tablet 1  . polyethylene glycol powder (GLYCOLAX/MIRALAX) 17  GM/SCOOP powder Take 17 g by mouth daily. 3350 g 1  . potassium gluconate 595 (99 K) MG TABS tablet Take 595 mg by mouth daily.    . vitamin B-12 (CYANOCOBALAMIN) 1000 MCG tablet Take 1,000 mcg by mouth daily.      No current facility-administered medications on file prior to visit.   Past Medical History:  Diagnosis Date  . Acquired hypothyroidism 12/31/2015  . Acute cystitis without hematuria 06/12/2020  . Acute laryngopharyngitis 05/23/2020  . Angina pectoris ([place])   . 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 Coronary artery disease involving native coronary artery of native heart 10/07/2016   Overview:  PTCA and drug-eluting stents to mid proximal portion to LAD and mid and proximal portion of circumflex in 2017 Drug-eluting stent mid LAD in May 2018  . Dementia without behavioral disturbance ([place]) 03/10/2017  . Diabetes mellitus without complication ([place])   . Dyslipidemia (high LDL; low HDL) 10/07/2016  . Essential hypertension, benign 10/07/2016  . GERD (gastroesophageal reflux disease)   . Hyperlipidemia   . Hypertension   . Insulin dependent type 2 diabetes mellitus, controlled ([place]) 10/07/2016  . Memory impairment 03/10/2017  . Mixed hyperlipidemia 12/06/2019  . NSTEMI (non-ST elevated myocardial infarction) ([place]) 09/07/2016  . Thyroid disease   . Vitamin D deficiency 03/08/2017   Past Surgical History:  Procedure Laterality Date  . CARDIAC CATHETERIZATION      Family History  Problem Relation Age of Onset  . Diabetes Mother   . Cancer Mother   . Heart disease Mother   . Heart disease Father   . Alcohol abuse Father    Social History   Socioeconomic History  . Marital status: Widowed    Spouse name: Not on file  . Number of children: Not on file  . Years of education: Not on file  . Highest education level: Not on file  Occupational History  . Occupation: retired  Tobacco Use  . Smoking status: Never Smoker  . Smokeless tobacco: Current User    Types: Snuff  . Tobacco comment: one can a week  Vaping Use  . Vaping Use: Never used  Substance and Sexual Activity  . Alcohol use: No  . Drug use: No  . Sexual activity: Not Currently  Other Topics Concern  . Not on file  Social History Narrative  . Not on file   Social Determinants of Health   Financial Resource Strain: Not on file  Food Insecurity: Not on file  Transportation Needs: Not on file  Physical Activity: Not on file  Stress: Not on file  Social Connections: Not on file    Review of  Systems  CONSTITUTIONAL: Negative for chills, fatigue, fever, unintentional weight gain and unintentional weight loss.  CARDIOVASCULAR: Negative for chest pain, dizziness, palpitations and pedal edema.  RESPIRATORY: Negative for recent cough and dyspnea.  GASTROINTESTINAL: Negative for abdominal pain, acid reflux symptoms, constipation, diarrhea, nausea and vomiting. GU- see HPI       Objective:  BP 132/64   Pulse 63   Temp (!) 97.3 F (36.3 C)   Ht 4' 11.5" (1.511 m)   Wt 107 lb (48.5 kg)   SpO2 99%   BMI 21.25 kg/m   BP/Weight 02/07/2021 01/20/2021 7/40/8144  Systolic BP 818 563 149  Diastolic BP 64 70 90  Wt. (Lbs) 107 108  109  BMI 21.25 21.45 20.6    Physical Exam PHYSICAL EXAM:   VS: BP 132/64   Pulse 63   Temp (!) 97.3 F (36.3 C)   Ht 4' 11.5" (1.511 m)   Wt 107 lb (48.5 kg)   SpO2 99%   BMI 21.25 kg/m   GEN: Well nourished, well developed, in no acute distress  Cardiac: RRR; no murmurs, rubs, or gallops,no edema -  Respiratory:  normal respiratory rate and pattern with no distress - normal breath sounds with no rales, rhonchi, wheezes or rubs Skin: warm and dry, no rash   Office Visit on 02/07/2021  Component Date Value Ref Range Status  . Glucose, UA 02/07/2021 Positive* Negative Final   1+  . Bilirubin, UA 02/07/2021 neg   Final  . Ketones, UA 02/07/2021 neg   Final  . Spec Grav, UA 02/07/2021 1.020  1.010 - 1.025 Final  . Blood, UA 02/07/2021 neg   Final  . pH, UA 02/07/2021 6.5  5.0 - 8.0 Final  . Protein, UA 02/07/2021 Positive* Negative Final   2+  . Urobilinogen, UA 02/07/2021 0.2  0.2 or 1.0 E.U./dL Final  . Nitrite, UA 02/07/2021 neg   Final  . Leukocytes, UA 02/07/2021 Negative  Negative Final    Diabetic Foot Exam - Simple   No data filed      Lab Results  Component Value Date   WBC 10.3 01/20/2021   HGB 12.8 01/20/2021   HCT 38.4 01/20/2021   PLT 360 01/20/2021   GLUCOSE 161 (H) 01/20/2021   CHOL 105 10/23/2020   TRIG 121  10/23/2020   HDL 43 10/23/2020   LDLCALC 40 10/23/2020   ALT 21 01/20/2021   AST 19 01/20/2021   NA 145 (H) 01/20/2021   K 3.8 01/20/2021   CL 103 01/20/2021   CREATININE 0.79 01/20/2021   BUN 19 01/20/2021   CO2 23 01/20/2021   TSH 1.770 10/23/2020   HGBA1C 7.2 (H) 10/23/2020   MICROALBUR 150 06/12/2020      Assessment & Plan:   1. Acute cystitis without hematuria - POCT urinalysis dipstick - Urine Culture Recommend drink plenty of water 2. Essential hypertension, benign  continue current meds  No orders of the defined types were placed in this encounter.   Orders Placed This Encounter  Procedures  . Urine Culture  . POCT urinalysis dipstick     Follow-up: Return in about 4 weeks (around 03/07/2021) for chronic follow up.  An After Visit Summary was printed and given to the patient.  Yetta Flock [facility] [phone]

## 2021-02-08 LAB — URINE CULTURE

## 2021-02-15 ENCOUNTER — Other Ambulatory Visit: Payer: Self-pay | Admitting: Physician Assistant

## 2021-02-15 DIAGNOSIS — E782 Mixed hyperlipidemia: Secondary | ICD-10-CM

## 2021-03-06 ENCOUNTER — Encounter: Payer: Self-pay | Admitting: Physician Assistant

## 2021-03-06 ENCOUNTER — Ambulatory Visit (INDEPENDENT_AMBULATORY_CARE_PROVIDER_SITE_OTHER): Payer: Medicare HMO | Admitting: Physician Assistant

## 2021-03-06 ENCOUNTER — Other Ambulatory Visit: Payer: Self-pay

## 2021-03-06 VITALS — BP 130/80 | HR 64 | Temp 96.4°F | Ht 59.5 in | Wt 107.6 lb

## 2021-03-06 DIAGNOSIS — E039 Hypothyroidism, unspecified: Secondary | ICD-10-CM

## 2021-03-06 DIAGNOSIS — E559 Vitamin D deficiency, unspecified: Secondary | ICD-10-CM | POA: Diagnosis not present

## 2021-03-06 DIAGNOSIS — E119 Type 2 diabetes mellitus without complications: Secondary | ICD-10-CM

## 2021-03-06 DIAGNOSIS — I2510 Atherosclerotic heart disease of native coronary artery without angina pectoris: Secondary | ICD-10-CM

## 2021-03-06 DIAGNOSIS — I10 Essential (primary) hypertension: Secondary | ICD-10-CM

## 2021-03-06 DIAGNOSIS — K219 Gastro-esophageal reflux disease without esophagitis: Secondary | ICD-10-CM

## 2021-03-06 DIAGNOSIS — E782 Mixed hyperlipidemia: Secondary | ICD-10-CM | POA: Diagnosis not present

## 2021-03-06 DIAGNOSIS — Z794 Long term (current) use of insulin: Secondary | ICD-10-CM

## 2021-03-06 NOTE — Progress Notes (Signed)
Established Patient Office Visit  Subjective:  Patient ID: Nancy Lang, female    DOB: 1946/07/29  Age: 75 y.o. MRN: [id number]  CC:  Chief Complaint  Patient presents with  . Follow-up    87M F/U    HPI Nancy Lang presents for chronic follow up   Mixed hyperlipidemia  Pt presents with hyperlipidemia. . Compliance with treatment has been good The patient is compliant with medications, maintains a low cholesterol diet , follows up as directed ,  The patient denies experiencing any hypercholesterolemia related symptoms.  She is currently taking lipitor 50m qd  Pt presents for follow up of hypertension.  The patient is tolerating the medication well without side effects. Compliance with treatment has been good; including taking medication as directed , maintains a healthy diet and regular exercise regimen , and following up as directed.  Pt currently on zestril 133mqd and metoprolol 5028md - she has also seen cardiology in the past several weeks and was placed on imdur 10m59mich has imiproved her readings as well  Pt with history of CAD - currently on beta blocker and Imdur -- has seen cardiology in past several weeks - daughter is unsure when she has next follow up appointment Dementia: She is here for follow upt of cognitive problems. She is accompanied by  daughter.  Primary caregiver is her. She states that her symptoms are stable at this time Functional Assessment:  Activities of Daily Living (ADLs):   She is independent in the following: feeding Requires assistance with the following: bathing and hygiene, toileting and dressing Currently on Namenda 10mg59mand aricept 10mg 48m-- is currently following with neurology  Hypothyroidism: Patient presents for evaluation of thyroid function. She has had some recent fatigue - she is currently on levothyroxine 50mcg 35mPt for follow up for diabetes - states glucose ranges are 'all over the place' recently - daughter  states in the mornings mostly ranges are from 70-180 - she gets lantus 20 units daily at this time  Pt with history of low vitamin D - due for labwork  Pt with history of GERD - she is currently on protonix - daughter states that she seems to have lots of problems with reflux and burping and belching - would like referral to GI - it has been several years since she had endoscopy - states her symptoms have worsened  Past Medical History:  Diagnosis Date  . Acquired hypothyroidism 12/31/2015  . Acute cystitis without hematuria 06/12/2020  . Acute laryngopharyngitis 05/23/2020  . 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 Coronary artery disease involving native coronary artery of native heart 10/07/2016   Overview:  PTCA and drug-eluting stents to mid proximal portion to LAD and mid and proximal portion of circumflex in 2017 Drug-eluting stent mid LAD in May 2018  . Dementia without behavioral disturbance (HCC) 5/[place]2018  . Diabetes mellitus without complication (HCC)   [place] LDL; low HDL[place] 10/07/2016  . Essential hypertension, benign 10/07/2016  . GERD (gastroesophageal reflux disease)   . Hyperlipidemia   . Hypertension   . Insulin dependent type 2 diabetes mellitus, controlled (HCC) [place]/2017  . Malaise 01/20/2021  . Memory impairment 03/10/2017  . Mixed hyperlipidemia 12/06/2019  . NSTEMI (non-ST elevated myocardial infarction) (HCC) [place]/2017  .  Thyroid disease   . Vitamin D deficiency 03/08/2017    Past Surgical History:  Procedure Laterality Date  . CARDIAC CATHETERIZATION      Family History  Problem Relation Age of Onset  . Diabetes Mother   . Cancer Mother   . Heart disease Mother   . Heart disease Father   . Alcohol abuse Father     Social History   Socioeconomic History  . Marital status:  Widowed    Spouse name: Not on file  . Number of children: Not on file  . Years of education: Not on file  . Highest education level: Not on file  Occupational History  . Occupation: retired  Tobacco Use  . Smoking status: Never Smoker  . Smokeless tobacco: Current User    Types: Snuff  . Tobacco comment: one can a week  Vaping Use  . Vaping Use: Never used  Substance and Sexual Activity  . Alcohol use: No  . Drug use: No  . Sexual activity: Not Currently  Other Topics Concern  . Not on file  Social History Narrative  . Not on file   Social Determinants of Health   Financial Resource Strain: Not on file  Food Insecurity: Not on file  Transportation Needs: Not on file  Physical Activity: Not on file  Stress: Not on file  Social Connections: Not on file  Intimate Partner Violence: Not on file     Current Outpatient Medications:  .  Accu-Chek FastClix Lancets MISC, Just Lancets Unknown size, Disp: , Rfl:  .  ascorbic acid (VITAMIN C) 500 MG tablet, Take 500 mg by mouth daily., Disp: , Rfl:  .  aspirin 81 MG chewable tablet, Chew 81 mg by mouth daily. , Disp: , Rfl:  .  atorvastatin (LIPITOR) 80 MG tablet, Take 1 tablet (80 mg total) by mouth at bedtime., Disp: 90 tablet, Rfl: 0 .  Calcium-Magnesium-Zinc (CAL-MAG-ZINC PO), Take 1 tablet by mouth daily with breakfast. Unknown strength per patient, Disp: , Rfl:  .  Cholecalciferol (VITAMIN D3) 1000 units CAPS, Take 1,000 Units by mouth daily. , Disp: , Rfl:  .  donepezil (ARICEPT) 10 MG tablet, Take 10 mg by mouth at bedtime. , Disp: , Rfl:  .  DROPLET PEN NEEDLES 32G X 4 MM MISC, USE AS DIRECTED ONE TIME DAILY (Patient taking differently: Needles unknown size per patient), Disp: 100 each, Rfl: 3 .  glucose blood (ACCU-CHEK GUIDE) test strip, CHECK BLOOD GLUCOSE THREE TIMES DAILY, Disp: 300 strip, Rfl: 1 .  insulin glargine (LANTUS SOLOSTAR) 100 UNIT/ML Solostar Pen, ADMINISTER 25 UNITS UNDER THE SKIN EVERY NIGHT AT BEDTIME  (Patient taking differently: Inject 25 Units into the skin at bedtime.), Disp: 15 pen, Rfl: 3 .  isosorbide mononitrate (IMDUR) 30 MG 24 hr tablet, Take 1 tablet (30 mg total) by mouth daily., Disp: 90 tablet, Rfl: 1 .  levothyroxine (SYNTHROID) 50 MCG tablet, TAKE 1 TABLET EVERY DAY (Patient taking differently: Take 50 mcg by mouth daily before breakfast.), Disp: 90 tablet, Rfl: 1 .  lisinopril (ZESTRIL) 10 MG tablet, TAKE 1 TABLET EVERY DAY (Patient taking differently: Take 10 mg by mouth daily. TAKE 1 TABLET EVERY DAY), Disp: 90 tablet, Rfl: 0 .  memantine (NAMENDA) 10 MG tablet, Take 1 tablet by mouth 2 (two) times daily., Disp: , Rfl:  .  metoprolol tartrate (LOPRESSOR) 50 MG tablet, TAKE 1 TABLET TWICE DAILY (Patient taking differently: Take 50 mg by mouth 2 (two) times daily.), Disp: 180 tablet,  Rfl: 0 .  montelukast (SINGULAIR) 10 MG tablet, TAKE 1 TABLET EVERY DAY (Patient taking differently: Take 10 mg by mouth 2 (two) times daily.), Disp: 90 tablet, Rfl: 3 .  Multiple Vitamins-Minerals (ONE-A-DAY PROACTIVE 65+) TABS, Take 1 tablet by mouth daily with breakfast. Unknown strength per patient, Disp: , Rfl:  .  nitrofurantoin, macrocrystal-monohydrate, (MACROBID) 100 MG capsule, TAKE 1 CAPSULE EVERY DAY (Patient taking differently: Take 100 mg by mouth daily with breakfast.), Disp: 90 capsule, Rfl: 0 .  Omega-3 1000 MG CAPS, Take 1,000 mg by mouth daily. , Disp: , Rfl:  .  pantoprazole (PROTONIX) 40 MG tablet, TAKE 1 TABLET EVERY DAY (Patient taking differently: Take 40 mg by mouth daily.), Disp: 90 tablet, Rfl: 1 .  potassium gluconate 595 (99 K) MG TABS tablet, Take 595 mg by mouth daily., Disp: , Rfl:    Allergies  Allergen Reactions  . Pravastatin     ANGER AND MEMORY ISSUES  . Ativan [Lorazepam] Anxiety and Other (See Comments)    Made the patient "paranoid and mean" (per family)- also worsened anxiety  . Fenofibrate Rash  . Oxytetracycline Rash  . Penicillins Rash  . Septra  [Sulfamethoxazole-Trimethoprim] Rash  . Sulfa Antibiotics Rash  . Tetracyclines & Related Rash    ROS CONSTITUTIONAL: see HPI E/N/T: Negative for ear pain, nasal congestion and sore throat.  CARDIOVASCULAR: Negative for chest pain, dizziness, palpitations and pedal edema.  RESPIRATORY: Negative for recent cough and dyspnea.  GASTROINTESTINAL: see HPI MSK: Negative for arthralgias and myalgias.  INTEGUMENTARY: Negative for rash.  NEUROLOGICAL: Negative for dizziness and headaches.  PSYCHIATRIC: Negative for sleep disturbance and to question depression screen.  Negative for depression, negative for anhedonia.        Objective:    PHYSICAL EXAM:   VS: BP 130/80 (BP Location: Left Arm, Patient Position: Sitting, Cuff Size: Normal)   Pulse 64   Temp (!) 96.4 F (35.8 C) (Temporal)   Ht 4' 11.5" (1.511 m)   Wt 107 lb 9.6 oz (48.8 kg)   SpO2 97%   BMI 21.37 kg/m   PHYSICAL EXAM:   VS: BP 130/80 (BP Location: Left Arm, Patient Position: Sitting, Cuff Size: Normal)   Pulse 64   Temp (!) 96.4 F (35.8 C) (Temporal)   Ht 4' 11.5" (1.511 m)   Wt 107 lb 9.6 oz (48.8 kg)   SpO2 97%   BMI 21.37 kg/m   GEN: Well nourished, well developed, in no acute distress  HEENT: normal external ears and nose - normal external auditory canals and TMS - hearing grossly normal - normal nasal mucosa and septum - Lips, Teeth and Gums - normal  Oropharynx - normal mucosa, palate, and posterior pharynx Neck: no JVD or masses - no thyromegaly Cardiac: RRR; no murmurs, rubs, or gallops,no edema - no significant varicosities Respiratory:  normal respiratory rate and pattern with no distress - normal breath sounds with no rales, rhonchi, wheezes or rubs GI: normal bowel sounds, no masses or tenderness MS: no deformity or atrophy  Skin: warm and dry, no rash  Neuro:  Alert and Oriented x 3, Strength and sensation are intact - CN II-Xii grossly intact Psych: euthymic mood, appropriate affect and  demeanor   BP 130/80 (BP Location: Left Arm, Patient Position: Sitting, Cuff Size: Normal)   Pulse 64   Temp (!) 96.4 F (35.8 C) (Temporal)   Ht 4' 11.5" (1.511 m)   Wt 107 lb 9.6 oz (48.8 kg)   SpO2 97%  BMI 21.37 kg/m  Wt Readings from Last 3 Encounters:  03/06/21 107 lb 9.6 oz (48.8 kg)  02/07/21 107 lb (48.5 kg)  01/20/21 108 lb (49 kg)     There are no preventive care reminders to display for this patient.  There are no preventive care reminders to display for this patient.  Lab Results  Component Value Date   TSH 1.770 10/23/2020   Lab Results  Component Value Date   WBC 10.3 01/20/2021   HGB 12.8 01/20/2021   HCT 38.4 01/20/2021   MCV 95 01/20/2021   PLT 360 01/20/2021   Lab Results  Component Value Date   NA 145 (H) 01/20/2021   K 3.8 01/20/2021   CO2 23 01/20/2021   GLUCOSE 161 (H) 01/20/2021   BUN 19 01/20/2021   CREATININE 0.79 01/20/2021   BILITOT 1.2 01/20/2021   ALKPHOS 135 (H) 01/20/2021   AST 19 01/20/2021   ALT 21 01/20/2021   PROT 6.9 01/20/2021   ALBUMIN 4.6 01/20/2021   CALCIUM 9.8 01/20/2021   EGFR 78 01/20/2021   Lab Results  Component Value Date   CHOL 105 10/23/2020   Lab Results  Component Value Date   HDL 43 10/23/2020   Lab Results  Component Value Date   LDLCALC 40 10/23/2020   Lab Results  Component Value Date   TRIG 121 10/23/2020   Lab Results  Component Value Date   CHOLHDL 2.4 10/23/2020   Lab Results  Component Value Date   HGBA1C 7.2 (H) 10/23/2020      Assessment & Plan:   Problem List Items Addressed This Visit      Cardiovascular and Mediastinum   Coronary artery disease involving native coronary artery of native heart Continue current meds and follow up with cardiology as directed    Essential hypertension, benign - Primary   Relevant Orders   CBC with Differential/Platelet   Comprehensive metabolic panel Continue meds as directed     Digestive   GERD (gastroesophageal reflux disease)    Relevant Orders   Ambulatory referral to Gastroenterology Continue Protonix     Endocrine   Acquired hypothyroidism   Relevant Orders   TSH Continue synthroid   Insulin dependent type 2 diabetes mellitus, controlled ([place])   Relevant Orders   CBC with Differential/Platelet   Comprehensive metabolic panel   Lipid panel   Hemoglobin A1c Continue lantus as directed     Other   Hyperlipidemia Continue meds and watch diet    Vitamin D insufficiency   Relevant Orders   VITAMIN D 25 Hydroxy (Vit-D Deficiency, Fractures)      No orders of the defined types were placed in this encounter.   Follow-up: Return in about 3 months (around 06/06/2021) for chronic fasting follow up.    SARA R Jadden Yim, PA-C

## 2021-03-07 ENCOUNTER — Ambulatory Visit: Payer: Medicare HMO | Admitting: Cardiology

## 2021-03-07 ENCOUNTER — Other Ambulatory Visit: Payer: Self-pay | Admitting: Physician Assistant

## 2021-03-07 ENCOUNTER — Other Ambulatory Visit: Payer: Self-pay

## 2021-03-07 ENCOUNTER — Other Ambulatory Visit: Payer: Medicare HMO

## 2021-03-07 DIAGNOSIS — E876 Hypokalemia: Secondary | ICD-10-CM

## 2021-03-07 DIAGNOSIS — N3000 Acute cystitis without hematuria: Secondary | ICD-10-CM

## 2021-03-07 LAB — CBC WITH DIFFERENTIAL/PLATELET
Basophils Absolute: 0.1 10*3/uL (ref 0.0–0.2)
Basos: 1 %
EOS (ABSOLUTE): 0.2 10*3/uL (ref 0.0–0.4)
Eos: 2 %
Hematocrit: 35.1 % (ref 34.0–46.6)
Hemoglobin: 12 g/dL (ref 11.1–15.9)
Immature Grans (Abs): 0 10*3/uL (ref 0.0–0.1)
Immature Granulocytes: 0 %
Lymphocytes Absolute: 2.4 10*3/uL (ref 0.7–3.1)
Lymphs: 25 %
MCH: 32.2 pg (ref 26.6–33.0)
MCHC: 34.2 g/dL (ref 31.5–35.7)
MCV: 94 fL (ref 79–97)
Monocytes Absolute: 0.9 10*3/uL (ref 0.1–0.9)
Monocytes: 9 %
Neutrophils Absolute: 6.3 10*3/uL (ref 1.4–7.0)
Neutrophils: 63 %
Platelets: 312 10*3/uL (ref 150–450)
RBC: 3.73 x10E6/uL — ABNORMAL LOW (ref 3.77–5.28)
RDW: 13.9 % (ref 11.7–15.4)
WBC: 9.9 10*3/uL (ref 3.4–10.8)

## 2021-03-07 LAB — LIPID PANEL
Chol/HDL Ratio: 3 ratio (ref 0.0–4.4)
Cholesterol, Total: 127 mg/dL (ref 100–199)
HDL: 43 mg/dL (ref >39)
LDL Chol Calc (NIH): 57 mg/dL (ref 0–99)
Triglycerides: 157 mg/dL — ABNORMAL HIGH (ref 0–149)
VLDL Cholesterol Cal: 27 mg/dL (ref 5–40)

## 2021-03-07 LAB — COMPREHENSIVE METABOLIC PANEL
ALT: 24 IU/L (ref 0–32)
AST: 17 IU/L (ref 0–40)
Albumin/Globulin Ratio: 2.1 (ref 1.2–2.2)
Albumin: 4.2 g/dL (ref 3.7–4.7)
Alkaline Phosphatase: 114 IU/L (ref 44–121)
BUN/Creatinine Ratio: 22 (ref 12–28)
BUN: 17 mg/dL (ref 8–27)
Bilirubin Total: 1.1 mg/dL (ref 0.0–1.2)
CO2: 29 mmol/L (ref 20–29)
Calcium: 9.5 mg/dL (ref 8.7–10.3)
Chloride: 101 mmol/L (ref 96–106)
Creatinine, Ser: 0.78 mg/dL (ref 0.57–1.00)
Globulin, Total: 2 g/dL (ref 1.5–4.5)
Glucose: 102 mg/dL — ABNORMAL HIGH (ref 65–99)
Potassium: 3 mmol/L — ABNORMAL LOW (ref 3.5–5.2)
Sodium: 143 mmol/L (ref 134–144)
Total Protein: 6.2 g/dL (ref 6.0–8.5)
eGFR: 79 mL/min/{1.73_m2} (ref >59)

## 2021-03-07 LAB — CARDIOVASCULAR RISK ASSESSMENT

## 2021-03-07 LAB — HEMOGLOBIN A1C
Est. average glucose Bld gHb Est-mCnc: 163 mg/dL
Hgb A1c MFr Bld: 7.3 % — ABNORMAL HIGH (ref 4.8–5.6)

## 2021-03-07 LAB — VITAMIN D 25 HYDROXY (VIT D DEFICIENCY, FRACTURES): Vit D, 25-Hydroxy: 46.7 ng/mL (ref 30.0–100.0)

## 2021-03-07 LAB — TSH: TSH: 1.84 u[IU]/mL (ref 0.450–4.500)

## 2021-03-11 ENCOUNTER — Other Ambulatory Visit: Payer: Self-pay

## 2021-03-11 ENCOUNTER — Other Ambulatory Visit: Payer: Medicare HMO

## 2021-03-11 DIAGNOSIS — E876 Hypokalemia: Secondary | ICD-10-CM | POA: Diagnosis not present

## 2021-03-12 LAB — COMPREHENSIVE METABOLIC PANEL
ALT: 21 IU/L (ref 0–32)
AST: 13 IU/L (ref 0–40)
Albumin/Globulin Ratio: 1.9 (ref 1.2–2.2)
Albumin: 4.2 g/dL (ref 3.7–4.7)
Alkaline Phosphatase: 155 IU/L — ABNORMAL HIGH (ref 44–121)
BUN/Creatinine Ratio: 24 (ref 12–28)
BUN: 22 mg/dL (ref 8–27)
Bilirubin Total: 0.9 mg/dL (ref 0.0–1.2)
CO2: 27 mmol/L (ref 20–29)
Calcium: 9.7 mg/dL (ref 8.7–10.3)
Chloride: 100 mmol/L (ref 96–106)
Creatinine, Ser: 0.91 mg/dL (ref 0.57–1.00)
Globulin, Total: 2.2 g/dL (ref 1.5–4.5)
Glucose: 278 mg/dL — ABNORMAL HIGH (ref 65–99)
Potassium: 4 mmol/L (ref 3.5–5.2)
Sodium: 141 mmol/L (ref 134–144)
Total Protein: 6.4 g/dL (ref 6.0–8.5)
eGFR: 65 mL/min/{1.73_m2} (ref >59)

## 2021-03-14 ENCOUNTER — Other Ambulatory Visit: Payer: Medicare HMO

## 2021-03-15 ENCOUNTER — Other Ambulatory Visit: Payer: Self-pay | Admitting: Physician Assistant

## 2021-03-15 DIAGNOSIS — I10 Essential (primary) hypertension: Secondary | ICD-10-CM

## 2021-03-21 ENCOUNTER — Other Ambulatory Visit: Payer: Self-pay | Admitting: Physician Assistant

## 2021-03-21 DIAGNOSIS — I10 Essential (primary) hypertension: Secondary | ICD-10-CM

## 2021-03-26 ENCOUNTER — Other Ambulatory Visit: Payer: Self-pay | Admitting: Physician Assistant

## 2021-03-26 DIAGNOSIS — E119 Type 2 diabetes mellitus without complications: Secondary | ICD-10-CM

## 2021-03-26 MED ORDER — LANTUS SOLOSTAR 100 UNIT/ML ~~LOC~~ SOPN: 25.0000 [IU] | PEN_INJECTOR | Freq: Every day | SUBCUTANEOUS | 3 refills | Status: DC

## 2021-04-08 ENCOUNTER — Telehealth: Payer: Self-pay | Admitting: Physician Assistant

## 2021-04-08 NOTE — Chronic Care Management (AMB) (Signed)
  Chronic Care Management   Outreach Note  04/08/2021 Name: Nancy Lang MRN: [id number] DOB: [birth year]  Referred by: Marge Duncans, PA-C Reason for referral : No chief complaint on file.   Third unsuccessful telephone outreach was attempted today. The patient was referred to the pharmacist for assistance with care management and care coordination.   Follow Up Plan:   Tatjana Dellinger Upstream Scheduler

## 2021-04-16 ENCOUNTER — Other Ambulatory Visit: Payer: Self-pay | Admitting: Physician Assistant

## 2021-05-05 ENCOUNTER — Other Ambulatory Visit: Payer: Self-pay

## 2021-05-05 MED ORDER — DONEPEZIL HCL 10 MG PO TABS: 10.0000 mg | ORAL_TABLET | Freq: Every day | ORAL | 2 refills | Status: DC

## 2021-05-05 MED ORDER — MEMANTINE HCL 10 MG PO TABS: 10.0000 mg | ORAL_TABLET | Freq: Two times a day (BID) | ORAL | 2 refills | Status: DC

## 2021-05-19 ENCOUNTER — Other Ambulatory Visit: Payer: Self-pay | Admitting: Physician Assistant

## 2021-05-22 ENCOUNTER — Other Ambulatory Visit: Payer: Self-pay

## 2021-05-22 ENCOUNTER — Encounter: Payer: Self-pay | Admitting: Physician Assistant

## 2021-05-22 ENCOUNTER — Ambulatory Visit (INDEPENDENT_AMBULATORY_CARE_PROVIDER_SITE_OTHER): Payer: Medicare HMO | Admitting: Physician Assistant

## 2021-05-22 VITALS — BP 128/80 | HR 61 | Temp 96.6°F | Ht 59.5 in | Wt 107.9 lb

## 2021-05-22 DIAGNOSIS — R1084 Generalized abdominal pain: Secondary | ICD-10-CM

## 2021-05-22 DIAGNOSIS — R300 Dysuria: Secondary | ICD-10-CM | POA: Diagnosis not present

## 2021-05-22 LAB — POCT URINALYSIS DIP (CLINITEK)
Bilirubin, UA: NEGATIVE
Blood, UA: NEGATIVE
Glucose, UA: 500 mg/dL — AB
Leukocytes, UA: NEGATIVE
Nitrite, UA: NEGATIVE
POC PROTEIN,UA: >=300 — AB
Spec Grav, UA: 1.025 (ref 1.010–1.025)
Urobilinogen, UA: NEGATIVE E.U./dL — AB
pH, UA: 6 (ref 5.0–8.0)

## 2021-05-22 NOTE — Progress Notes (Signed)
Acute Office Visit  Subjective:    Patient ID: Nancy Lang, female    DOB: 1946-03-03, 75 y.o.   MRN: [id number]  Chief Complaint  Patient presents with   Abdominal Pain    HPI Patient is in today for abdominal discomfort - states she is having some lower abdominal pain today and daughter states she seems more tired and sleepy Usually feels this way when she has UTI She does in fact have appt next week with GI for her history of belching/burping/ and GERD symptoms Pt does complain of dysuria as well today Denies fever, nausea, vomiting, --- appetite has been good Past Medical History:  Diagnosis Date   Acquired hypothyroidism 12/31/2015   Acute cystitis without hematuria 06/12/2020   Acute laryngopharyngitis 05/23/2020   Angina pectoris ([place])    Atypical nevus 06/12/2020   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, stenosis left P2   Cerebral hemorrhage ([place]) 05/05/2018   Small area R posterior, 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   Coronary artery disease involving native coronary artery of native heart 10/07/2016   Overview:  PTCA and drug-eluting stents to mid proximal portion to LAD and mid and proximal portion of circumflex in 2017 Drug-eluting stent mid LAD in May 2018   Dementia without behavioral disturbance ([place]) 03/10/2017   Diabetes mellitus without complication (HCC)    Dyslipidemia (high LDL; low HDL) 10/07/2016   Essential hypertension, benign 10/07/2016   GERD (gastroesophageal reflux disease)    Hyperlipidemia    Hypertension    Insulin dependent type 2 diabetes mellitus, controlled ([place]) 10/07/2016   Malaise 01/20/2021   Memory impairment 03/10/2017   Mixed hyperlipidemia 12/06/2019   NSTEMI (non-ST elevated myocardial infarction) ([place]) 09/07/2016   Thyroid disease    Vitamin D deficiency 03/08/2017    Past Surgical History:  Procedure Laterality Date   CARDIAC CATHETERIZATION      Family History  Problem Relation Age of Onset   Diabetes Mother    Cancer Mother     Heart disease Mother    Heart disease Father    Alcohol abuse Father     Social History   Socioeconomic History   Marital status: Widowed    Spouse name: Not on file   Number of children: Not on file   Years of education: Not on file   Highest education level: Not on file  Occupational History   Occupation: retired  Tobacco Use   Smoking status: Never   Smokeless tobacco: Current    Types: Snuff   Tobacco comments:    one can a week  Vaping Use   Vaping Use: Never used  Substance and Sexual Activity   Alcohol use: No   Drug use: No   Sexual activity: Not Currently  Other Topics Concern   Not on file  Social History Narrative   Not on file   Social Determinants of Health   Financial Resource Strain: Not on file  Food Insecurity: Not on file  Transportation Needs: Not on file  Physical Activity: Not on file  Stress: Not on file  Social Connections: Not on file  Intimate Partner Violence: Not on file    Outpatient Medications Prior to Visit  Medication Sig Dispense Refill   Accu-Chek FastClix Lancets MISC Just Lancets Unknown size     ascorbic acid (VITAMIN C) 500 MG tablet Take 500 mg by mouth daily.     aspirin 81 MG chewable tablet Chew 81 mg by mouth daily.  atorvastatin (LIPITOR) 80 MG tablet Take 1 tablet (80 mg total) by mouth at bedtime. 90 tablet 0   Calcium-Magnesium-Zinc (CAL-MAG-ZINC PO) Take 1 tablet by mouth daily with breakfast. Unknown strength per patient     Cholecalciferol (VITAMIN D3) 1000 units CAPS Take 1,000 Units by mouth daily.      donepezil (ARICEPT) 10 MG tablet Take 1 tablet (10 mg total) by mouth at bedtime. 30 tablet 2   DROPLET PEN NEEDLES 32G X 4 MM MISC USE AS DIRECTED ONE TIME DAILY (Patient taking differently: Needles unknown size per patient) 100 each 3   glucose blood (ACCU-CHEK GUIDE) test strip CHECK BLOOD GLUCOSE THREE TIMES DAILY 300 strip 1   insulin glargine (LANTUS SOLOSTAR) 100 UNIT/ML Solostar Pen Inject 25 Units  into the skin at bedtime. 15 mL 3   levothyroxine (SYNTHROID) 50 MCG tablet TAKE 1 TABLET EVERY DAY 90 tablet 1   lisinopril (ZESTRIL) 10 MG tablet TAKE 1 TABLET EVERY DAY 90 tablet 0   memantine (NAMENDA) 10 MG tablet Take 1 tablet (10 mg total) by mouth 2 (two) times daily. 60 tablet 2   metoprolol tartrate (LOPRESSOR) 50 MG tablet TAKE 1 TABLET TWICE DAILY 180 tablet 0   montelukast (SINGULAIR) 10 MG tablet TAKE 1 TABLET EVERY DAY (Patient taking differently: Take 10 mg by mouth 2 (two) times daily.) 90 tablet 3   Multiple Vitamins-Minerals (ONE-A-DAY PROACTIVE 65+) TABS Take 1 tablet by mouth daily with breakfast. Unknown strength per patient     nitrofurantoin, macrocrystal-monohydrate, (MACROBID) 100 MG capsule Take 1 capsule (100 mg total) by mouth daily with breakfast. 90 capsule 0   Omega-3 1000 MG CAPS Take 1,000 mg by mouth daily.      pantoprazole (PROTONIX) 40 MG tablet TAKE 1 TABLET EVERY DAY 90 tablet 1   potassium gluconate 595 (99 K) MG TABS tablet Take 595 mg by mouth daily.     isosorbide mononitrate (IMDUR) 30 MG 24 hr tablet Take 1 tablet (30 mg total) by mouth daily. 90 tablet 1   No facility-administered medications prior to visit.    Allergies  Allergen Reactions   Pravastatin     ANGER AND MEMORY ISSUES   Ativan [Lorazepam] Anxiety and Other (See Comments)    Made the patient "paranoid and mean" (per family)- also worsened anxiety   Fenofibrate Rash   Oxytetracycline Rash   Penicillins Rash   Septra [Sulfamethoxazole-Trimethoprim] Rash   Sulfa Antibiotics Rash   Tetracyclines & Related Rash    Review of Systems CONSTITUTIONAL:see HPI CARDIOVASCULAR: Negative for chest pain, dizziness, palpitations and pedal edema.  RESPIRATORY: Negative for recent cough and dyspnea.  GASTROINTESTINAL: see HPI MSK: Negative for arthralgias and myalgias.  INTEGUMENTARY: Negative for rash.  NEUROLOGICAL: Negative for dizziness and headaches.        Objective:     PHYSICAL EXAM:   VS: BP 128/80 (BP Location: Left Arm, Patient Position: Sitting, Cuff Size: Normal)   Pulse 61   Temp (!) 96.6 F (35.9 C) (Temporal)   Ht 4' 11.5" (1.511 m)   Wt 107 lb 14.4 oz (48.9 kg)   SpO2 95%   BMI 21.43 kg/m   GEN: Well nourished, well developed, in no acute distress  HEENT: normal external ears and nose - normal external auditory canals and TMS -  - Lips, Teeth and Gums - normal  Oropharynx - normal mucosa, palate, and posterior pharynx Cardiac: RRR; no murmurs, rubs, or gallops,no edema - Respiratory:  normal respiratory rate and pattern  with no distress - normal breath sounds with no rales, rhonchi, wheezes or rubs GI: normal bowel sounds, no masses - mild generalized tenderness to palpation  Office Visit on 05/22/2021  Component Date Value Ref Range Status   Color, UA 05/22/2021 yellow  yellow Final   Clarity, UA 05/22/2021 clear  clear Final   Glucose, UA 05/22/2021 =500 (A) negative mg/dL Final   Bilirubin, UA 05/22/2021 negative  negative Final   Ketones, POC UA 05/22/2021 trace (5) (A) negative mg/dL Final   Spec Grav, UA 05/22/2021 1.025  1.010 - 1.025 Final   Blood, UA 05/22/2021 negative  negative Final   pH, UA 05/22/2021 6.0  5.0 - 8.0 Final   POC PROTEIN,UA 05/22/2021 >=300 (A) negative, trace Final   Urobilinogen, UA 05/22/2021 negative (A) 0.2 or 1.0 E.U./dL Final   Nitrite, UA 05/22/2021 Negative  Negative Final   Leukocytes, UA 05/22/2021 Negative  Negative Final    Wt Readings from Last 3 Encounters:  05/22/21 107 lb 14.4 oz (48.9 kg)  03/06/21 107 lb 9.6 oz (48.8 kg)  02/07/21 107 lb (48.5 kg)    Health Maintenance Due  Topic Date Due   Zoster Vaccines- Shingrix (1 of 2) Never done   TETANUS/TDAP  04/25/2021   OPHTHALMOLOGY EXAM  05/13/2021    There are no preventive care reminders to display for this patient.   Lab Results  Component Value Date   TSH 1.840 03/06/2021   Lab Results  Component Value Date   WBC 9.9  03/06/2021   HGB 12.0 03/06/2021   HCT 35.1 03/06/2021   MCV 94 03/06/2021   PLT 312 03/06/2021   Lab Results  Component Value Date   NA 141 03/11/2021   K 4.0 03/11/2021   CO2 27 03/11/2021   GLUCOSE 278 (H) 03/11/2021   BUN 22 03/11/2021   CREATININE 0.91 03/11/2021   BILITOT 0.9 03/11/2021   ALKPHOS 155 (H) 03/11/2021   AST 13 03/11/2021   ALT 21 03/11/2021   PROT 6.4 03/11/2021   ALBUMIN 4.2 03/11/2021   CALCIUM 9.7 03/11/2021   EGFR 65 03/11/2021   Lab Results  Component Value Date   CHOL 127 03/06/2021   Lab Results  Component Value Date   HDL 43 03/06/2021   Lab Results  Component Value Date   LDLCALC 57 03/06/2021   Lab Results  Component Value Date   TRIG 157 (H) 03/06/2021   Lab Results  Component Value Date   CHOLHDL 3.0 03/06/2021   Lab Results  Component Value Date   HGBA1C 7.3 (H) 03/06/2021       Assessment & Plan:  1. Dysuria - Urine Culture - POCT URINALYSIS DIP ([facility]) Recommend to take her macrobid twice daily until culture results back 2. Generalized abdominal pain - CBC with Differential/Platelet - Comprehensive metabolic panel    No orders of the defined types were placed in this encounter.   Orders Placed This Encounter  Procedures   Urine Culture   CBC with Differential/Platelet   Comprehensive metabolic panel   POCT URINALYSIS DIP (CLIN[facility])     Follow-up: Return in about 4 weeks (around 06/19/2021) for chronic fasting follow up.  An After Visit Summary was printed and given to the patient.  Yetta Flock [facility] [phone]

## 2021-05-23 LAB — COMPREHENSIVE METABOLIC PANEL
ALT: 25 IU/L (ref 0–32)
AST: 20 IU/L (ref 0–40)
Albumin/Globulin Ratio: 2.1 (ref 1.2–2.2)
Albumin: 4.1 g/dL (ref 3.7–4.7)
Alkaline Phosphatase: 115 IU/L (ref 44–121)
BUN/Creatinine Ratio: 22 (ref 12–28)
BUN: 20 mg/dL (ref 8–27)
Bilirubin Total: 1 mg/dL (ref 0.0–1.2)
CO2: 25 mmol/L (ref 20–29)
Calcium: 9.5 mg/dL (ref 8.7–10.3)
Chloride: 101 mmol/L (ref 96–106)
Creatinine, Ser: 0.91 mg/dL (ref 0.57–1.00)
Globulin, Total: 2 g/dL (ref 1.5–4.5)
Glucose: 307 mg/dL — ABNORMAL HIGH (ref 65–99)
Potassium: 4.2 mmol/L (ref 3.5–5.2)
Sodium: 140 mmol/L (ref 134–144)
Total Protein: 6.1 g/dL (ref 6.0–8.5)
eGFR: 65 mL/min/{1.73_m2} (ref >59)

## 2021-05-23 LAB — CBC WITH DIFFERENTIAL/PLATELET
Basophils Absolute: 0.1 10*3/uL (ref 0.0–0.2)
Basos: 1 %
EOS (ABSOLUTE): 0.3 10*3/uL (ref 0.0–0.4)
Eos: 3 %
Hematocrit: 32.6 % — ABNORMAL LOW (ref 34.0–46.6)
Hemoglobin: 11 g/dL — ABNORMAL LOW (ref 11.1–15.9)
Immature Grans (Abs): 0 10*3/uL (ref 0.0–0.1)
Immature Granulocytes: 1 %
Lymphocytes Absolute: 1.5 10*3/uL (ref 0.7–3.1)
Lymphs: 17 %
MCH: 32.4 pg (ref 26.6–33.0)
MCHC: 33.7 g/dL (ref 31.5–35.7)
MCV: 96 fL (ref 79–97)
Monocytes Absolute: 0.7 10*3/uL (ref 0.1–0.9)
Monocytes: 9 %
Neutrophils Absolute: 6 10*3/uL (ref 1.4–7.0)
Neutrophils: 69 %
Platelets: 288 10*3/uL (ref 150–450)
RBC: 3.39 x10E6/uL — ABNORMAL LOW (ref 3.77–5.28)
RDW: 13.6 % (ref 11.7–15.4)
WBC: 8.6 10*3/uL (ref 3.4–10.8)

## 2021-05-24 LAB — URINE CULTURE

## 2021-05-27 DIAGNOSIS — R109 Unspecified abdominal pain: Secondary | ICD-10-CM | POA: Diagnosis not present

## 2021-05-27 DIAGNOSIS — R12 Heartburn: Secondary | ICD-10-CM | POA: Diagnosis not present

## 2021-05-27 DIAGNOSIS — D649 Anemia, unspecified: Secondary | ICD-10-CM | POA: Diagnosis not present

## 2021-05-27 DIAGNOSIS — R197 Diarrhea, unspecified: Secondary | ICD-10-CM | POA: Diagnosis not present

## 2021-06-09 ENCOUNTER — Ambulatory Visit: Payer: Medicare HMO | Admitting: Physician Assistant

## 2021-06-13 ENCOUNTER — Ambulatory Visit: Payer: Medicare HMO | Admitting: Physician Assistant

## 2021-06-26 ENCOUNTER — Other Ambulatory Visit: Payer: Self-pay | Admitting: Cardiology

## 2021-06-26 ENCOUNTER — Ambulatory Visit: Payer: Medicare HMO | Admitting: Physician Assistant

## 2021-06-28 ENCOUNTER — Other Ambulatory Visit: Payer: Self-pay | Admitting: Physician Assistant

## 2021-06-28 DIAGNOSIS — E119 Type 2 diabetes mellitus without complications: Secondary | ICD-10-CM

## 2021-06-28 DIAGNOSIS — Z794 Long term (current) use of insulin: Secondary | ICD-10-CM

## 2021-07-09 ENCOUNTER — Ambulatory Visit: Payer: Medicare HMO | Admitting: Physician Assistant

## 2021-07-13 ENCOUNTER — Other Ambulatory Visit: Payer: Self-pay | Admitting: Physician Assistant

## 2021-07-13 DIAGNOSIS — E782 Mixed hyperlipidemia: Secondary | ICD-10-CM

## 2021-07-23 ENCOUNTER — Encounter: Payer: Self-pay | Admitting: Physician Assistant

## 2021-07-23 ENCOUNTER — Ambulatory Visit (INDEPENDENT_AMBULATORY_CARE_PROVIDER_SITE_OTHER): Payer: Medicare HMO | Admitting: Physician Assistant

## 2021-07-23 ENCOUNTER — Other Ambulatory Visit: Payer: Self-pay

## 2021-07-23 VITALS — BP 150/82 | HR 65 | Temp 97.8°F | Ht 59.5 in | Wt 107.0 lb

## 2021-07-23 DIAGNOSIS — E559 Vitamin D deficiency, unspecified: Secondary | ICD-10-CM

## 2021-07-23 DIAGNOSIS — R413 Other amnesia: Secondary | ICD-10-CM | POA: Diagnosis not present

## 2021-07-23 DIAGNOSIS — I10 Essential (primary) hypertension: Secondary | ICD-10-CM | POA: Diagnosis not present

## 2021-07-23 DIAGNOSIS — E782 Mixed hyperlipidemia: Secondary | ICD-10-CM

## 2021-07-23 DIAGNOSIS — E119 Type 2 diabetes mellitus without complications: Secondary | ICD-10-CM | POA: Diagnosis not present

## 2021-07-23 DIAGNOSIS — Z794 Long term (current) use of insulin: Secondary | ICD-10-CM

## 2021-07-23 DIAGNOSIS — E039 Hypothyroidism, unspecified: Secondary | ICD-10-CM | POA: Diagnosis not present

## 2021-07-23 LAB — POCT URINALYSIS DIP (CLINITEK)
Bilirubin, UA: NEGATIVE
Blood, UA: NEGATIVE
Glucose, UA: NEGATIVE mg/dL
Ketones, POC UA: NEGATIVE mg/dL
Leukocytes, UA: NEGATIVE
Nitrite, UA: NEGATIVE
POC PROTEIN,UA: 100 — AB
Spec Grav, UA: 1.015 (ref 1.010–1.025)
Urobilinogen, UA: NEGATIVE E.U./dL — AB
pH, UA: 7 (ref 5.0–8.0)

## 2021-07-23 NOTE — Progress Notes (Signed)
Established Patient Office Visit  Subjective:  Patient ID: Nancy Lang, female    DOB: [birth year]  Age: 75 y.o. MRN: [id number]  CC:  Chief Complaint  Patient presents with   Diabetes   Hyperlipidemia    HPI Nancy Lang presents for chronic follow up   Mixed hyperlipidemia  Pt presents with hyperlipidemia. . Compliance with treatment has been good The patient is compliant with medications, maintains a low cholesterol diet , follows up as directed ,  The patient denies experiencing any hypercholesterolemia related symptoms.  She is currently taking lipitor 67m qd  Pt presents for follow up of hypertension.  The patient is tolerating the medication well without side effects. Compliance with treatment has been good; including taking medication as directed , maintains a healthy diet and regular exercise regimen , and following up as directed.  Pt currently on zestril 177mqd and metoprolol 5060md - she has also seen cardiology in the past several weeks and was placed on imdur 79m84mich has imiproved her readings as well  Pt with history of CAD - currently on beta blocker and Imdur -- has seen cardiology in past several weeks - daughter is unsure when she has next follow up appointment Dementia: She is here for follow upt of cognitive problems. She is accompanied by  daughter.  Primary caregiver is her. She states that her symptoms are stable at this time Functional Assessment:  Activities of Daily Living (ADLs):   She is independent in the following: feeding Requires assistance with the following: bathing and hygiene, toileting and dressing Currently on Namenda 10mg85mand aricept 10mg 57m-- is currently following with neurology  Hypothyroidism: Patient presents for evaluation of thyroid function. She has had some recent fatigue - she is currently on levothyroxine 50mcg 32mPt for follow up for diabetes - states glucose ranges are 'all over the place' recently -  daughter states in the mornings mostly ranges are from 70-180 - she gets lantus 20 units daily at this time  Pt with history of low vitamin D - due for labwork  Pt with history of GERD - she is currently on protonix - daughter states that she seems to have lots of problems with reflux and burping and belching - would like referral to GI - it has been several years since she had endoscopy - states her symptoms have worsened  Past Medical History:  Diagnosis Date   Acquired hypothyroidism 12/31/2015   Acute cystitis without hematuria 06/12/2020   Acute laryngopharyngitis 05/23/2020   Angina pectoris (HCC)   [place] 06[place]2020   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, stenosis left P2   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, 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   Coronary artery disease involving native coronary artery of native heart 10/07/2016   Overview:  PTCA and drug-eluting stents to mid proximal portion to LAD and mid and proximal portion of circumflex in 2017 Drug-eluting stent mid LAD in May 2018   Dementia without behavioral disturbance (HCC) 5/[place]2018   Diabetes mellitus without complication (HCC)   Hoffmanslipidemia (high LDL; low HDL) 10/07/2016   Essential hypertension, benign 10/07/2016   GERD (gastroesophageal reflux disease)    Hyperlipidemia    Hypertension    Insulin dependent type 2 diabetes mellitus, controlled (HCC) 12[place]/2017   Malaise 01/20/2021   Memory impairment 03/10/2017   Mixed hyperlipidemia 12/06/2019   NSTEMI (non-ST elevated myocardial infarction) (HCC) [place]/2017   Thyroid  disease    Vitamin D deficiency 03/08/2017    Past Surgical History:  Procedure Laterality Date   CARDIAC CATHETERIZATION      Family History  Problem Relation Age of Onset   Diabetes Mother    Cancer Mother    Heart disease Mother    Heart disease Father    Alcohol abuse Father     Social History   Socioeconomic History   Marital status: Widowed    Spouse name:  Not on file   Number of children: Not on file   Years of education: Not on file   Highest education level: Not on file  Occupational History   Occupation: retired  Tobacco Use   Smoking status: Never   Smokeless tobacco: Current    Types: Snuff   Tobacco comments:    one can a week  Vaping Use   Vaping Use: Never used  Substance and Sexual Activity   Alcohol use: No   Drug use: No   Sexual activity: Not Currently  Other Topics Concern   Not on file  Social History Narrative   Not on file   Social Determinants of Health   Financial Resource Strain: Not on file  Food Insecurity: Not on file  Transportation Needs: Not on file  Physical Activity: Not on file  Stress: Not on file  Social Connections: Not on file  Intimate Partner Violence: Not on file     Current Outpatient Medications:    Accu-Chek FastClix Lancets MISC, Just Lancets Unknown size, Disp: , Rfl:    ascorbic acid (VITAMIN C) 500 MG tablet, Take 500 mg by mouth daily., Disp: , Rfl:    aspirin 81 MG chewable tablet, Chew 81 mg by mouth daily. , Disp: , Rfl:    atorvastatin (LIPITOR) 80 MG tablet, TAKE 1 TABLET AT BEDTIME, Disp: 90 tablet, Rfl: 0   Calcium-Magnesium-Zinc (CAL-MAG-ZINC PO), Take 1 tablet by mouth daily with breakfast. Unknown strength per patient, Disp: , Rfl:    Cholecalciferol (VITAMIN D3) 1000 units CAPS, Take 1,000 Units by mouth daily. , Disp: , Rfl:    dicyclomine (BENTYL) 10 MG capsule, , Disp: , Rfl:    donepezil (ARICEPT) 10 MG tablet, Take 1 tablet (10 mg total) by mouth at bedtime., Disp: 30 tablet, Rfl: 2   DROPLET PEN NEEDLES 32G X 4 MM MISC, USE AS DIRECTED ONE TIME DAILY (Patient taking differently: Needles unknown size per patient), Disp: 100 each, Rfl: 3   glucose blood (ACCU-CHEK GUIDE) test strip, CHECK BLOOD GLUCOSE THREE TIMES DAILY, Disp: 300 strip, Rfl: 1   isosorbide mononitrate (IMDUR) 30 MG 24 hr tablet, TAKE 1 TABLET EVERY DAY, Disp: 90 tablet, Rfl: 1   LANTUS SOLOSTAR  100 UNIT/ML Solostar Pen, INJECT 25 UNITS INTO THE SKIN AT BEDTIME., Disp: 30 mL, Rfl: 1   levothyroxine (SYNTHROID) 50 MCG tablet, TAKE 1 TABLET EVERY DAY, Disp: 90 tablet, Rfl: 1   lisinopril (ZESTRIL) 10 MG tablet, TAKE 1 TABLET EVERY DAY, Disp: 90 tablet, Rfl: 0   memantine (NAMENDA) 10 MG tablet, Take 1 tablet (10 mg total) by mouth 2 (two) times daily., Disp: 60 tablet, Rfl: 2   metoprolol tartrate (LOPRESSOR) 50 MG tablet, TAKE 1 TABLET TWICE DAILY, Disp: 180 tablet, Rfl: 0   montelukast (SINGULAIR) 10 MG tablet, TAKE 1 TABLET EVERY DAY (Patient taking differently: Take 10 mg by mouth 2 (two) times daily.), Disp: 90 tablet, Rfl: 3   Multiple Vitamins-Minerals (ONE-A-DAY PROACTIVE 65+) TABS, Take 1 tablet  by mouth daily with breakfast. Unknown strength per patient, Disp: , Rfl:    nitrofurantoin, macrocrystal-monohydrate, (MACROBID) 100 MG capsule, Take 1 capsule (100 mg total) by mouth daily with breakfast., Disp: 90 capsule, Rfl: 0   Omega-3 1000 MG CAPS, Take 1,000 mg by mouth daily. , Disp: , Rfl:    pantoprazole (PROTONIX) 40 MG tablet, TAKE 1 TABLET EVERY DAY, Disp: 90 tablet, Rfl: 1   potassium gluconate 595 (99 K) MG TABS tablet, Take 595 mg by mouth daily., Disp: , Rfl:    Allergies  Allergen Reactions   Pravastatin     ANGER AND MEMORY ISSUES   Ativan [Lorazepam] Anxiety and Other (See Comments)    Made the patient "paranoid and mean" (per family)- also worsened anxiety   Fenofibrate Rash   Oxytetracycline Rash   Penicillins Rash   Septra [Sulfamethoxazole-Trimethoprim] Rash   Sulfa Antibiotics Rash   Tetracyclines & Related Rash   CONSTITUTIONAL: Negative for chills, fatigue, fever, unintentional weight gain and unintentional weight loss.  CARDIOVASCULAR: Negative for chest pain, dizziness, palpitations and pedal edema.  RESPIRATORY: Negative for recent cough and dyspnea.  GASTROINTESTINAL: Negative for abdominal pain, acid reflux symptoms, constipation, diarrhea, nausea  and vomiting.  INTEGUMENTARY: Negative for rash.  NEUROLOGICAL: Negative for dizziness and headaches.  PSYCHIATRIC: Negative for sleep disturbance and to question depression screen.  Negative for depression, negative for anhedonia.        Objective:  PHYSICAL EXAM:   VS: BP (!) 150/82 (BP Location: Left Arm, Patient Position: Sitting)   Pulse 65   Temp 97.8 F (36.6 C) (Temporal)   Ht 4' 11.5" (1.511 m)   Wt 107 lb (48.5 kg)   SpO2 98%   BMI 21.25 kg/m   GEN: Well nourished, well developed, in no acute distress  Cardiac: RRR; no murmurs, rubs, or gallops,no edema -  Respiratory:  normal respiratory rate and pattern with no distress - normal breath sounds with no rales, rhonchi, wheezes or rubs GI: normal bowel sounds, no masses or tenderness  Skin: warm and dry, no rash   Psych: euthymic mood, appropriate affect and demeanor   Office Visit on 07/23/2021  Component Date Value Ref Range Status   Glucose, UA 07/23/2021 negative  negative mg/dL Final   Bilirubin, UA 07/23/2021 negative  negative Final   Ketones, POC UA 07/23/2021 negative  negative mg/dL Final   Spec Grav, UA 07/23/2021 1.015  1.010 - 1.025 Final   Blood, UA 07/23/2021 negative  negative Final   pH, UA 07/23/2021 7.0  5.0 - 8.0 Final   POC PROTEIN,UA 07/23/2021 =100 (A) negative, trace Final   Urobilinogen, UA 07/23/2021 negative (A) 0.2 or 1.0 E.U./dL Final   Nitrite, UA 07/23/2021 Negative  Negative Final   Leukocytes, UA 07/23/2021 Negative  Negative Final     Health Maintenance Due  Topic Date Due   Zoster Vaccines- Shingrix (1 of 2) Never done   TETANUS/TDAP  04/25/2021   OPHTHALMOLOGY EXAM  05/13/2021    There are no preventive care reminders to display for this patient.  Lab Results  Component Value Date   TSH 1.840 03/06/2021   Lab Results  Component Value Date   WBC 8.6 05/22/2021   HGB 11.0 (L) 05/22/2021   HCT 32.6 (L) 05/22/2021   MCV 96 05/22/2021   PLT 288 05/22/2021   Lab  Results  Component Value Date   NA 140 05/22/2021   K 4.2 05/22/2021   CO2 25 05/22/2021   GLUCOSE 307 (  H) 05/22/2021   BUN 20 05/22/2021   CREATININE 0.91 05/22/2021   BILITOT 1.0 05/22/2021   ALKPHOS 115 05/22/2021   AST 20 05/22/2021   ALT 25 05/22/2021   PROT 6.1 05/22/2021   ALBUMIN 4.1 05/22/2021   CALCIUM 9.5 05/22/2021   EGFR 65 05/22/2021   Lab Results  Component Value Date   CHOL 127 03/06/2021   Lab Results  Component Value Date   HDL 43 03/06/2021   Lab Results  Component Value Date   LDLCALC 57 03/06/2021   Lab Results  Component Value Date   TRIG 157 (H) 03/06/2021   Lab Results  Component Value Date   CHOLHDL 3.0 03/06/2021   Lab Results  Component Value Date   HGBA1C 7.3 (H) 03/06/2021      Assessment & Plan:   Problem List Items Addressed This Visit       Cardiovascular and Mediastinum   Coronary artery disease involving native coronary artery of native heart Continue current meds and follow up with cardiology as directed    Essential hypertension, benign - Primary   Relevant Orders   CBC with Differential/Platelet   Comprehensive metabolic panel Continue meds as directed     Digestive   GERD (gastroesophageal reflux disease)   Relevant Orders   Ambulatory referral to Gastroenterology Continue Protonix     Endocrine   Acquired hypothyroidism   Relevant Orders   TSH Continue synthroid   Insulin dependent type 2 diabetes mellitus, controlled ([place])   Relevant Orders   CBC with Differential/Platelet   Comprehensive metabolic panel   Lipid panel   Hemoglobin A1c Continue lantus as directed     Other   Hyperlipidemia Continue meds and watch diet    Vitamin D insufficiency   Relevant Orders   VITAMIN D 25 Hydroxy (Vit-D Deficiency, Fractures)       No orders of the defined types were placed in this encounter.   Follow-up: Return in about 3 months (around 10/22/2021) for chronic fasting follow up.    SARA R Merleen Picazo,  PA-C

## 2021-07-24 LAB — CBC WITH DIFFERENTIAL/PLATELET
Basophils Absolute: 0.1 10*3/uL (ref 0.0–0.2)
Basos: 1 %
EOS (ABSOLUTE): 0.3 10*3/uL (ref 0.0–0.4)
Eos: 2 %
Hematocrit: 37.3 % (ref 34.0–46.6)
Hemoglobin: 12.7 g/dL (ref 11.1–15.9)
Immature Grans (Abs): 0 10*3/uL (ref 0.0–0.1)
Immature Granulocytes: 0 %
Lymphocytes Absolute: 2.3 10*3/uL (ref 0.7–3.1)
Lymphs: 22 %
MCH: 31.9 pg (ref 26.6–33.0)
MCHC: 34 g/dL (ref 31.5–35.7)
MCV: 94 fL (ref 79–97)
Monocytes Absolute: 0.9 10*3/uL (ref 0.1–0.9)
Monocytes: 9 %
Neutrophils Absolute: 6.7 10*3/uL (ref 1.4–7.0)
Neutrophils: 66 %
Platelets: 296 10*3/uL (ref 150–450)
RBC: 3.98 x10E6/uL (ref 3.77–5.28)
RDW: 13.1 % (ref 11.7–15.4)
WBC: 10.3 10*3/uL (ref 3.4–10.8)

## 2021-07-24 LAB — COMPREHENSIVE METABOLIC PANEL
ALT: 31 IU/L (ref 0–32)
AST: 24 IU/L (ref 0–40)
Albumin/Globulin Ratio: 1.6 (ref 1.2–2.2)
Albumin: 4.1 g/dL (ref 3.7–4.7)
Alkaline Phosphatase: 110 IU/L (ref 44–121)
BUN/Creatinine Ratio: 26 (ref 12–28)
BUN: 20 mg/dL (ref 8–27)
Bilirubin Total: 1 mg/dL (ref 0.0–1.2)
CO2: 26 mmol/L (ref 20–29)
Calcium: 9.7 mg/dL (ref 8.7–10.3)
Chloride: 101 mmol/L (ref 96–106)
Creatinine, Ser: 0.78 mg/dL (ref 0.57–1.00)
Globulin, Total: 2.5 g/dL (ref 1.5–4.5)
Glucose: 136 mg/dL — ABNORMAL HIGH (ref 70–99)
Potassium: 3.6 mmol/L (ref 3.5–5.2)
Sodium: 144 mmol/L (ref 134–144)
Total Protein: 6.6 g/dL (ref 6.0–8.5)
eGFR: 79 mL/min/{1.73_m2} (ref >59)

## 2021-07-24 LAB — HEMOGLOBIN A1C
Est. average glucose Bld gHb Est-mCnc: 174 mg/dL
Hgb A1c MFr Bld: 7.7 % — ABNORMAL HIGH (ref 4.8–5.6)

## 2021-07-24 LAB — VITAMIN D 25 HYDROXY (VIT D DEFICIENCY, FRACTURES): Vit D, 25-Hydroxy: 48.1 ng/mL (ref 30.0–100.0)

## 2021-07-24 LAB — CARDIOVASCULAR RISK ASSESSMENT

## 2021-07-24 LAB — LIPID PANEL
Chol/HDL Ratio: 3.1 ratio (ref 0.0–4.4)
Cholesterol, Total: 118 mg/dL (ref 100–199)
HDL: 38 mg/dL — ABNORMAL LOW (ref >39)
LDL Chol Calc (NIH): 48 mg/dL (ref 0–99)
Triglycerides: 193 mg/dL — ABNORMAL HIGH (ref 0–149)
VLDL Cholesterol Cal: 32 mg/dL (ref 5–40)

## 2021-07-24 LAB — TSH: TSH: 2.85 u[IU]/mL (ref 0.450–4.500)

## 2021-07-26 ENCOUNTER — Other Ambulatory Visit: Payer: Self-pay | Admitting: Physician Assistant

## 2021-07-26 DIAGNOSIS — N3000 Acute cystitis without hematuria: Secondary | ICD-10-CM

## 2021-07-30 DIAGNOSIS — D649 Anemia, unspecified: Secondary | ICD-10-CM | POA: Diagnosis not present

## 2021-07-30 DIAGNOSIS — I700 Atherosclerosis of aorta: Secondary | ICD-10-CM | POA: Diagnosis not present

## 2021-07-30 DIAGNOSIS — R109 Unspecified abdominal pain: Secondary | ICD-10-CM | POA: Diagnosis not present

## 2021-08-10 ENCOUNTER — Other Ambulatory Visit: Payer: Self-pay | Admitting: Physician Assistant

## 2021-08-10 ENCOUNTER — Other Ambulatory Visit: Payer: Self-pay | Admitting: Family Medicine

## 2021-08-10 DIAGNOSIS — I10 Essential (primary) hypertension: Secondary | ICD-10-CM

## 2021-08-13 ENCOUNTER — Other Ambulatory Visit: Payer: Self-pay | Admitting: Physician Assistant

## 2021-08-13 MED ORDER — MEMANTINE HCL 10 MG PO TABS: 10.0000 mg | ORAL_TABLET | Freq: Two times a day (BID) | ORAL | 0 refills | Status: DC

## 2021-09-20 ENCOUNTER — Other Ambulatory Visit: Payer: Self-pay | Admitting: Family Medicine

## 2021-10-08 ENCOUNTER — Other Ambulatory Visit: Payer: Self-pay | Admitting: Physician Assistant

## 2021-10-18 ENCOUNTER — Other Ambulatory Visit: Payer: Self-pay | Admitting: Physician Assistant

## 2021-10-19 DIAGNOSIS — R0789 Other chest pain: Secondary | ICD-10-CM | POA: Diagnosis not present

## 2021-10-19 DIAGNOSIS — F0390 Unspecified dementia without behavioral disturbance: Secondary | ICD-10-CM | POA: Diagnosis not present

## 2021-10-19 DIAGNOSIS — E876 Hypokalemia: Secondary | ICD-10-CM | POA: Diagnosis not present

## 2021-10-19 DIAGNOSIS — E039 Hypothyroidism, unspecified: Secondary | ICD-10-CM | POA: Diagnosis not present

## 2021-10-19 DIAGNOSIS — I2510 Atherosclerotic heart disease of native coronary artery without angina pectoris: Secondary | ICD-10-CM | POA: Diagnosis not present

## 2021-10-19 DIAGNOSIS — R079 Chest pain, unspecified: Secondary | ICD-10-CM | POA: Diagnosis not present

## 2021-10-19 DIAGNOSIS — I160 Hypertensive urgency: Secondary | ICD-10-CM | POA: Diagnosis not present

## 2021-10-19 DIAGNOSIS — I10 Essential (primary) hypertension: Secondary | ICD-10-CM | POA: Diagnosis not present

## 2021-10-19 DIAGNOSIS — F419 Anxiety disorder, unspecified: Secondary | ICD-10-CM | POA: Diagnosis not present

## 2021-10-19 DIAGNOSIS — I447 Left bundle-branch block, unspecified: Secondary | ICD-10-CM | POA: Diagnosis not present

## 2021-10-20 DIAGNOSIS — I10 Essential (primary) hypertension: Secondary | ICD-10-CM | POA: Diagnosis not present

## 2021-10-20 DIAGNOSIS — I447 Left bundle-branch block, unspecified: Secondary | ICD-10-CM | POA: Diagnosis not present

## 2021-10-20 DIAGNOSIS — I361 Nonrheumatic tricuspid (valve) insufficiency: Secondary | ICD-10-CM | POA: Diagnosis not present

## 2021-10-20 DIAGNOSIS — I160 Hypertensive urgency: Secondary | ICD-10-CM | POA: Diagnosis not present

## 2021-10-20 DIAGNOSIS — I340 Nonrheumatic mitral (valve) insufficiency: Secondary | ICD-10-CM | POA: Diagnosis not present

## 2021-10-20 DIAGNOSIS — I2510 Atherosclerotic heart disease of native coronary artery without angina pectoris: Secondary | ICD-10-CM | POA: Diagnosis not present

## 2021-10-20 DIAGNOSIS — F419 Anxiety disorder, unspecified: Secondary | ICD-10-CM | POA: Diagnosis not present

## 2021-10-20 DIAGNOSIS — R079 Chest pain, unspecified: Secondary | ICD-10-CM | POA: Diagnosis not present

## 2021-10-20 DIAGNOSIS — E876 Hypokalemia: Secondary | ICD-10-CM | POA: Diagnosis not present

## 2021-10-20 DIAGNOSIS — R0789 Other chest pain: Secondary | ICD-10-CM | POA: Diagnosis not present

## 2021-10-20 DIAGNOSIS — F0390 Unspecified dementia without behavioral disturbance: Secondary | ICD-10-CM | POA: Diagnosis not present

## 2021-10-20 DIAGNOSIS — E039 Hypothyroidism, unspecified: Secondary | ICD-10-CM | POA: Diagnosis not present

## 2021-10-20 DIAGNOSIS — I440 Atrioventricular block, first degree: Secondary | ICD-10-CM | POA: Diagnosis not present

## 2021-10-24 ENCOUNTER — Other Ambulatory Visit: Payer: Self-pay | Admitting: Physician Assistant

## 2021-10-28 ENCOUNTER — Inpatient Hospital Stay: Payer: Medicare HMO | Admitting: Physician Assistant

## 2021-10-28 IMAGING — CT CT CERVICAL SPINE W/O CM
4 of 8 series · 11 of 34 positions shown, 12 images · non-contrast
Comparison: None.

CLINICAL DATA: Fall out of bed

EXAM:
CT CERVICAL SPINE WITHOUT CONTRAST
TECHNIQUE: Multidetector CT imaging of the cervical spine was performed without
intravenous contrast. Multiplanar CT image reconstructions were also
generated.

[Series 5: c_spine 2.0 st · axial · 0.30mm/px · z∈[-220,-160]mm · 2 of 92 slices shown, 3 images]
[im 31/92  soft-tissue]
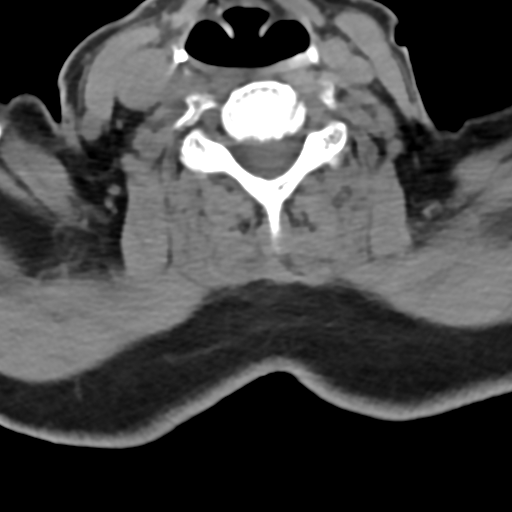
[im 31/92  bone]
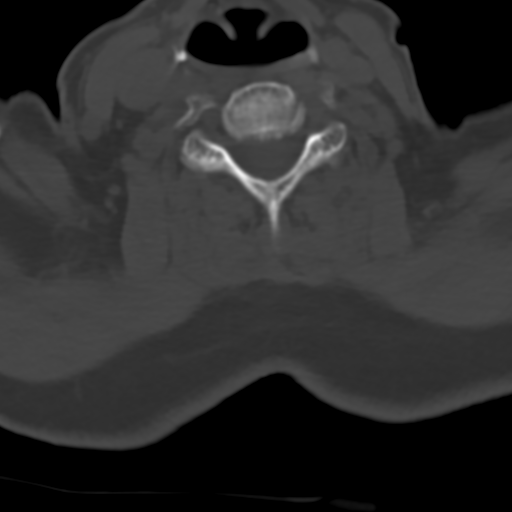
[im 61/92  bone]
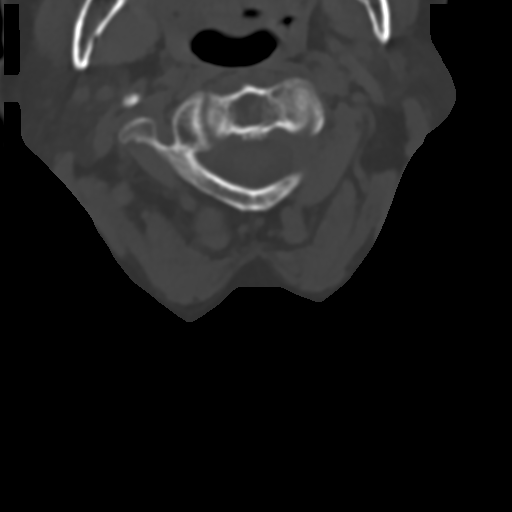

[Series 14: orthogonal axial st · axial · 0.21mm/px · z∈[-252,-186]mm · 2 of 87 slices shown]
[im 29/87  bone]
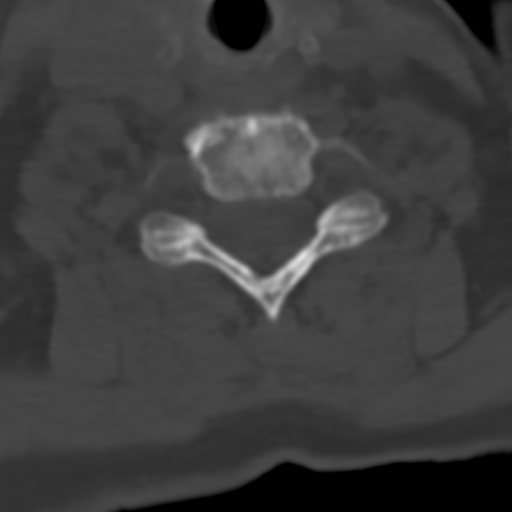
[im 58/87  bone]
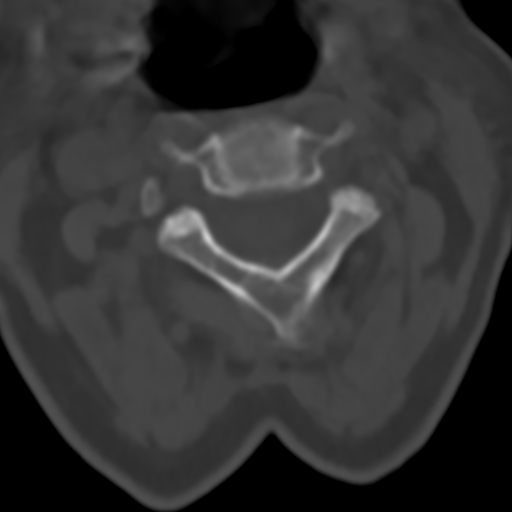

[Series 15: coronal bone · coronal · 0.23mm/px · 1 of 58 slices shown]
[im 29/58  bone]
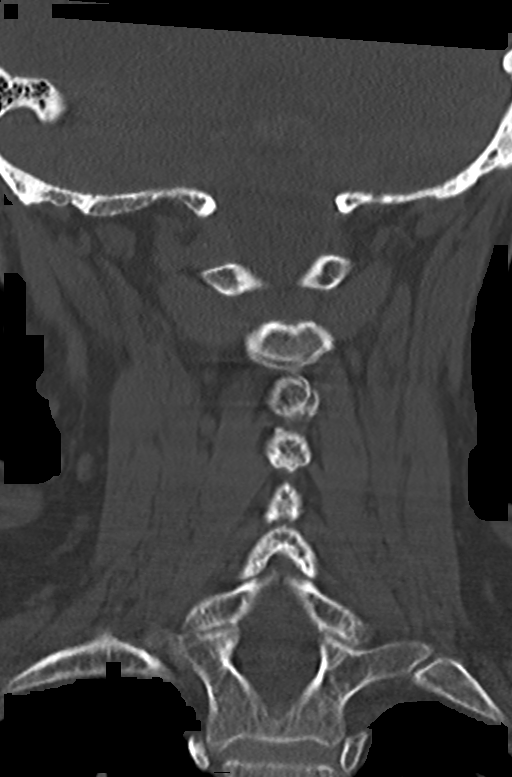

[Series 16: sagittal bone · sagittal · 0.24mm/px · 6 of 61 slices shown]
[im 11/61  bone]
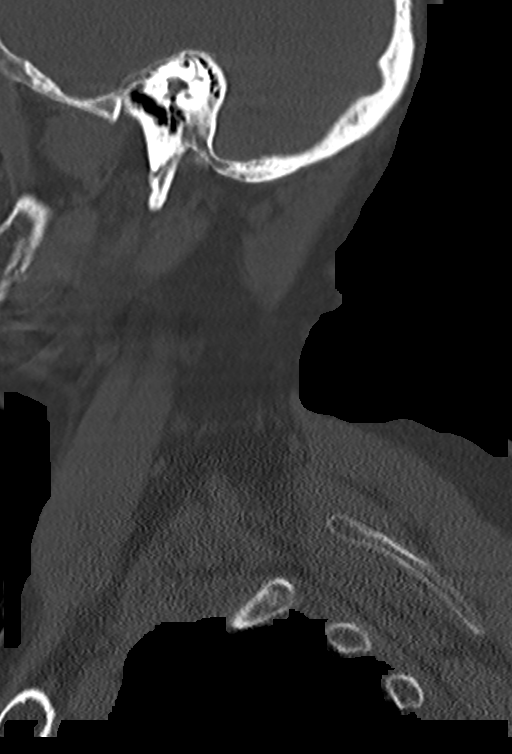
[im 21/61  bone]
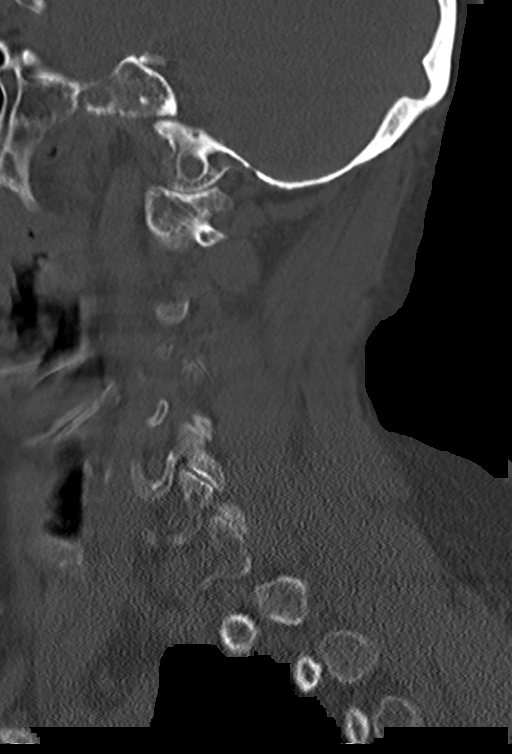
[im 22/61  soft-tissue]
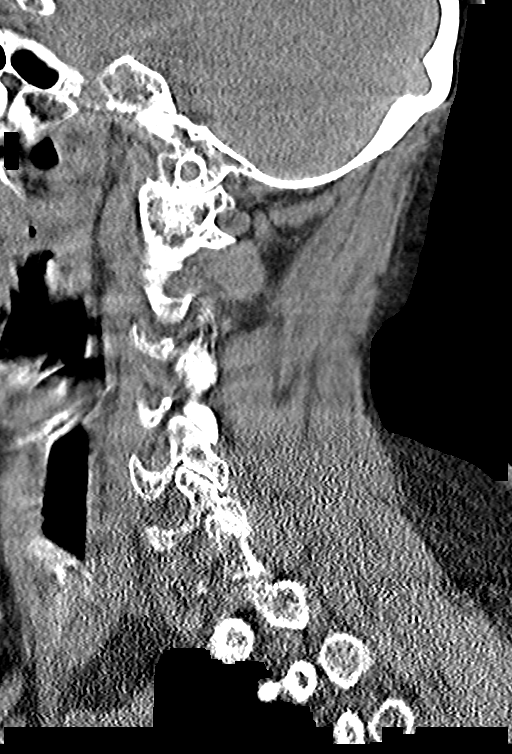
[im 31/61  bone]
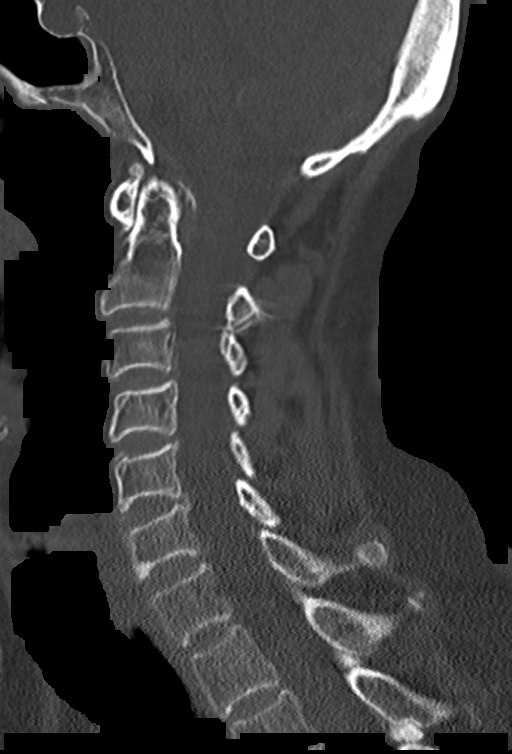
[im 41/61  bone]
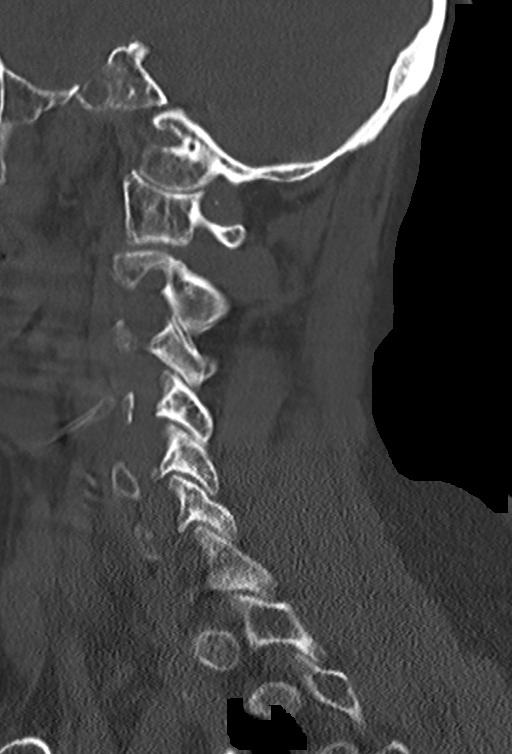
[im 51/61  bone]
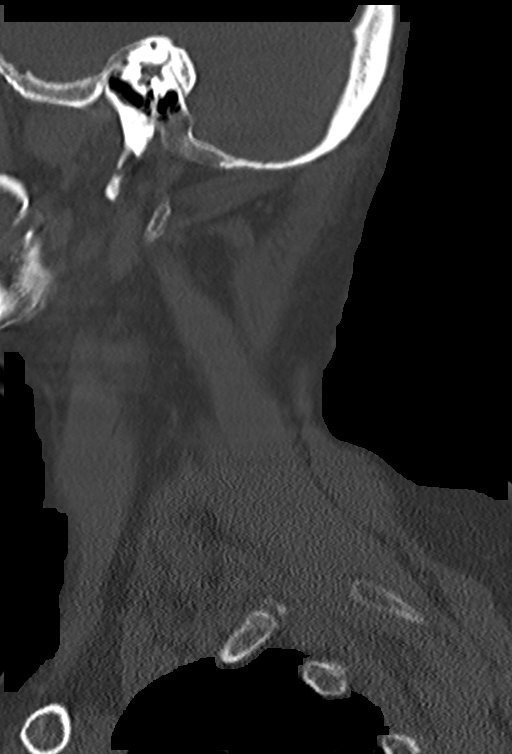

[11 of 34 positions shown; findings below may reference images not displayed]

FINDINGS: Alignment: Normal alignment.

Skull base and vertebrae: No acute fracture. No primary bone lesion
or focal pathologic process.

Soft tissues and spinal canal: No prevertebral fluid or swelling. No
visible canal hematoma.

Disc levels: Early anterior spurring. Disc spaces maintained. Early
degenerative facet disease bilaterally.

Upper chest: No acute findings

Other: None
IMPRESSION: No acute bony abnormality.

## 2021-10-28 IMAGING — DX DG PORTABLE PELVIS
1 series · 1 of 1 positions shown · non-contrast
Comparison: None.

CLINICAL DATA: Fall onto right side getting out of bed.

EXAM:
PORTABLE PELVIS 1-2 VIEWS

[pelvis ap]
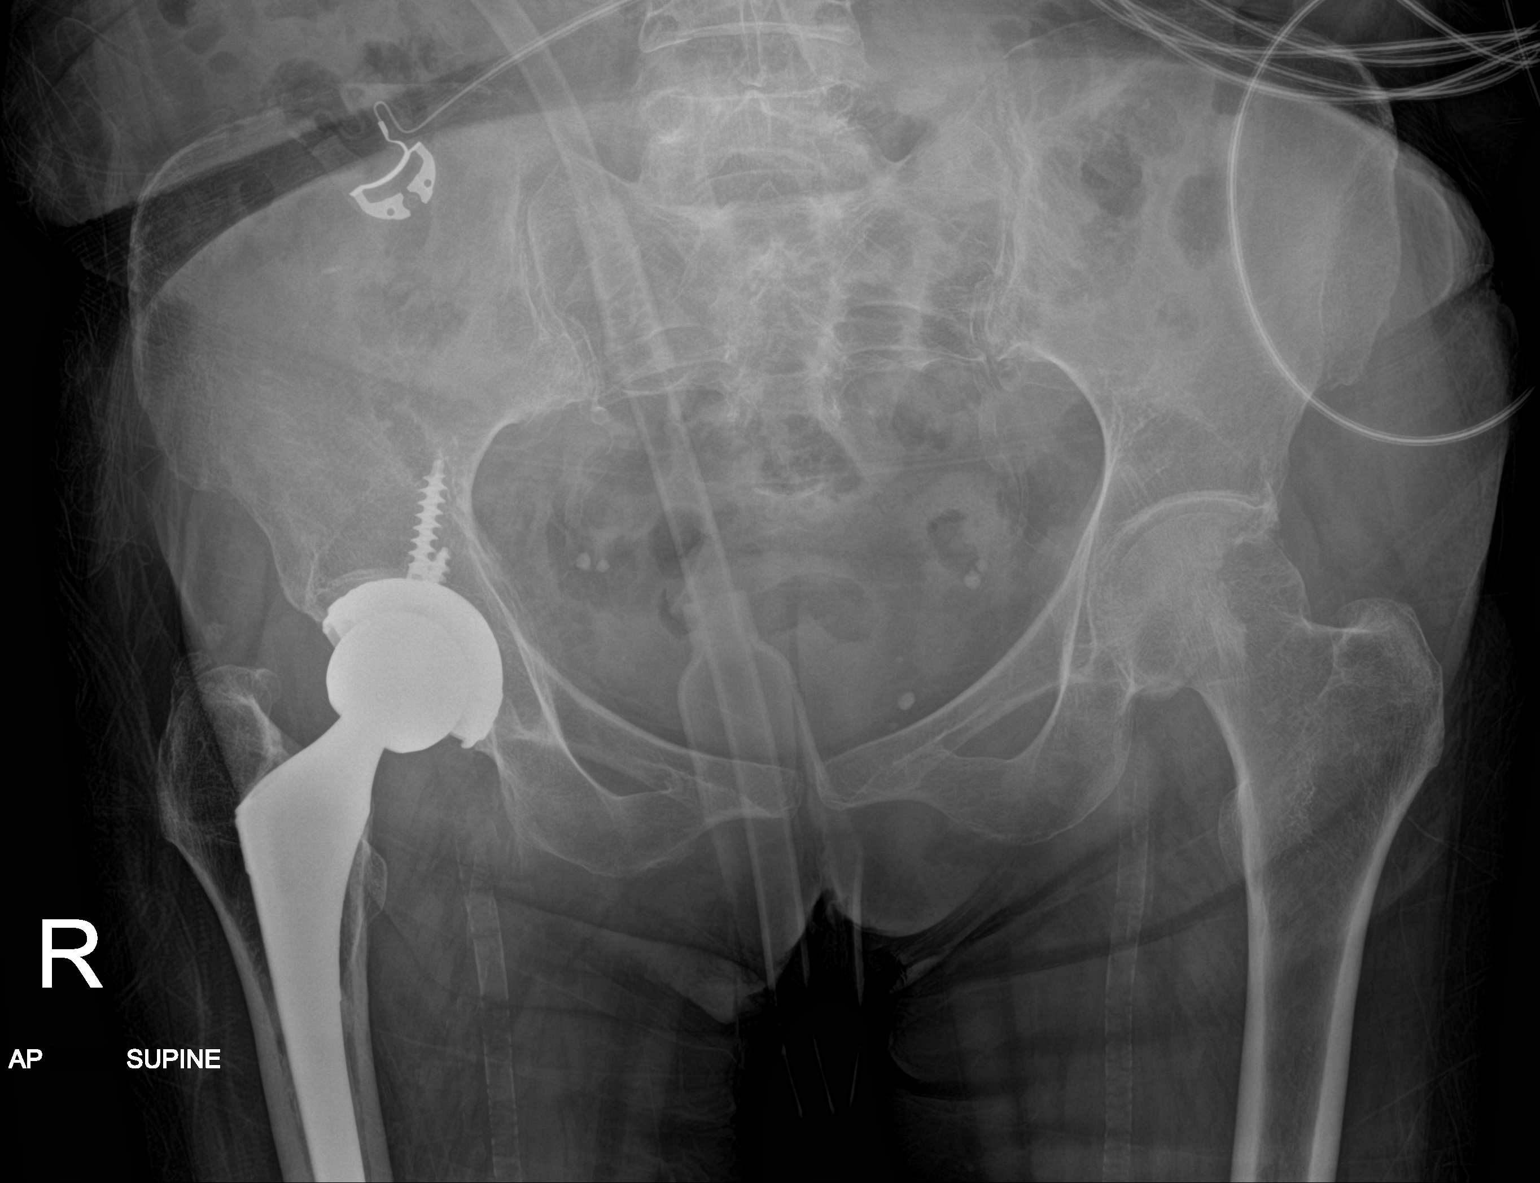

[1 of 1 positions shown; findings below may reference images not displayed]

FINDINGS: Cortical margins of the pelvis are intact. Right hip arthroplasty is
intact were visualized. There is no periprosthetic fracture. Distal
most femoral stem not entirely included in the field of view. The
pubic rami are intact. Pubic symphysis and sacroiliac joints are
congruent. Mild left hip degenerative change. The bones are under
mineralized.
IMPRESSION: No acute pelvic fracture. Intact right hip arthroplasty.

## 2021-10-28 IMAGING — DX DG CHEST 1V PORT
1 series · 1 of 1 positions shown · non-contrast
Comparison: None.

CLINICAL DATA: Fall onto right side

EXAM:
PORTABLE CHEST 1 VIEW

[chest ap]
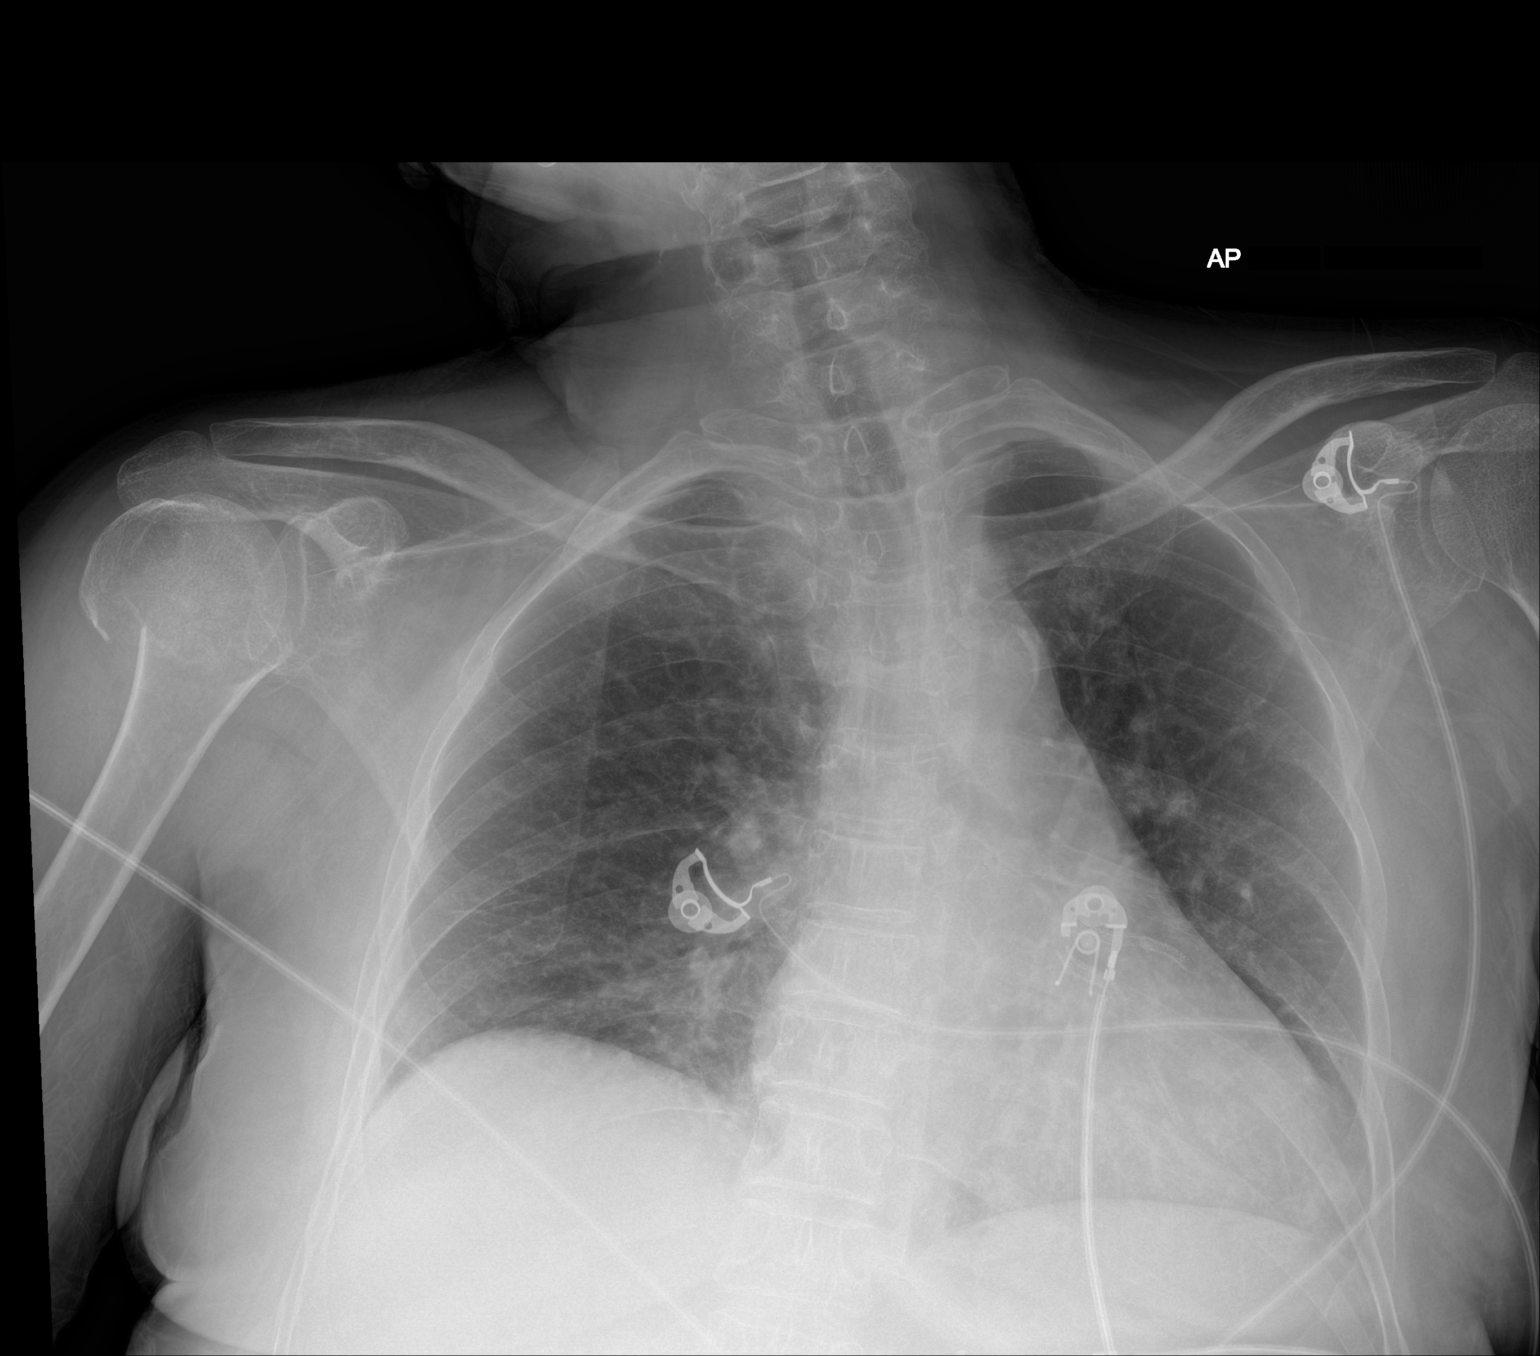

[1 of 1 positions shown; findings below may reference images not displayed]

FINDINGS: Lung volumes are low. Heart is normal in size for technique. Normal
mediastinal contours with aortic atherosclerosis. No pneumothorax or
pleural effusion. No focal airspace disease. Displaced transverse
fracture through the right proximal humerus. No evidence of rib
fracture. Mild scoliotic curvature of the spine.
IMPRESSION: 1. Low lung volumes without acute cardiopulmonary process.
2. Displaced right proximal humerus fracture.

## 2021-10-28 IMAGING — DX DG SHOULDER 2+V PORT*R*
1 series · 1 of 1 positions shown · non-contrast
Comparison: None.

CLINICAL DATA: Fall with deformity and right shoulder pain.

EXAM:
PORTABLE RIGHT SHOULDER

[shoulder ap]
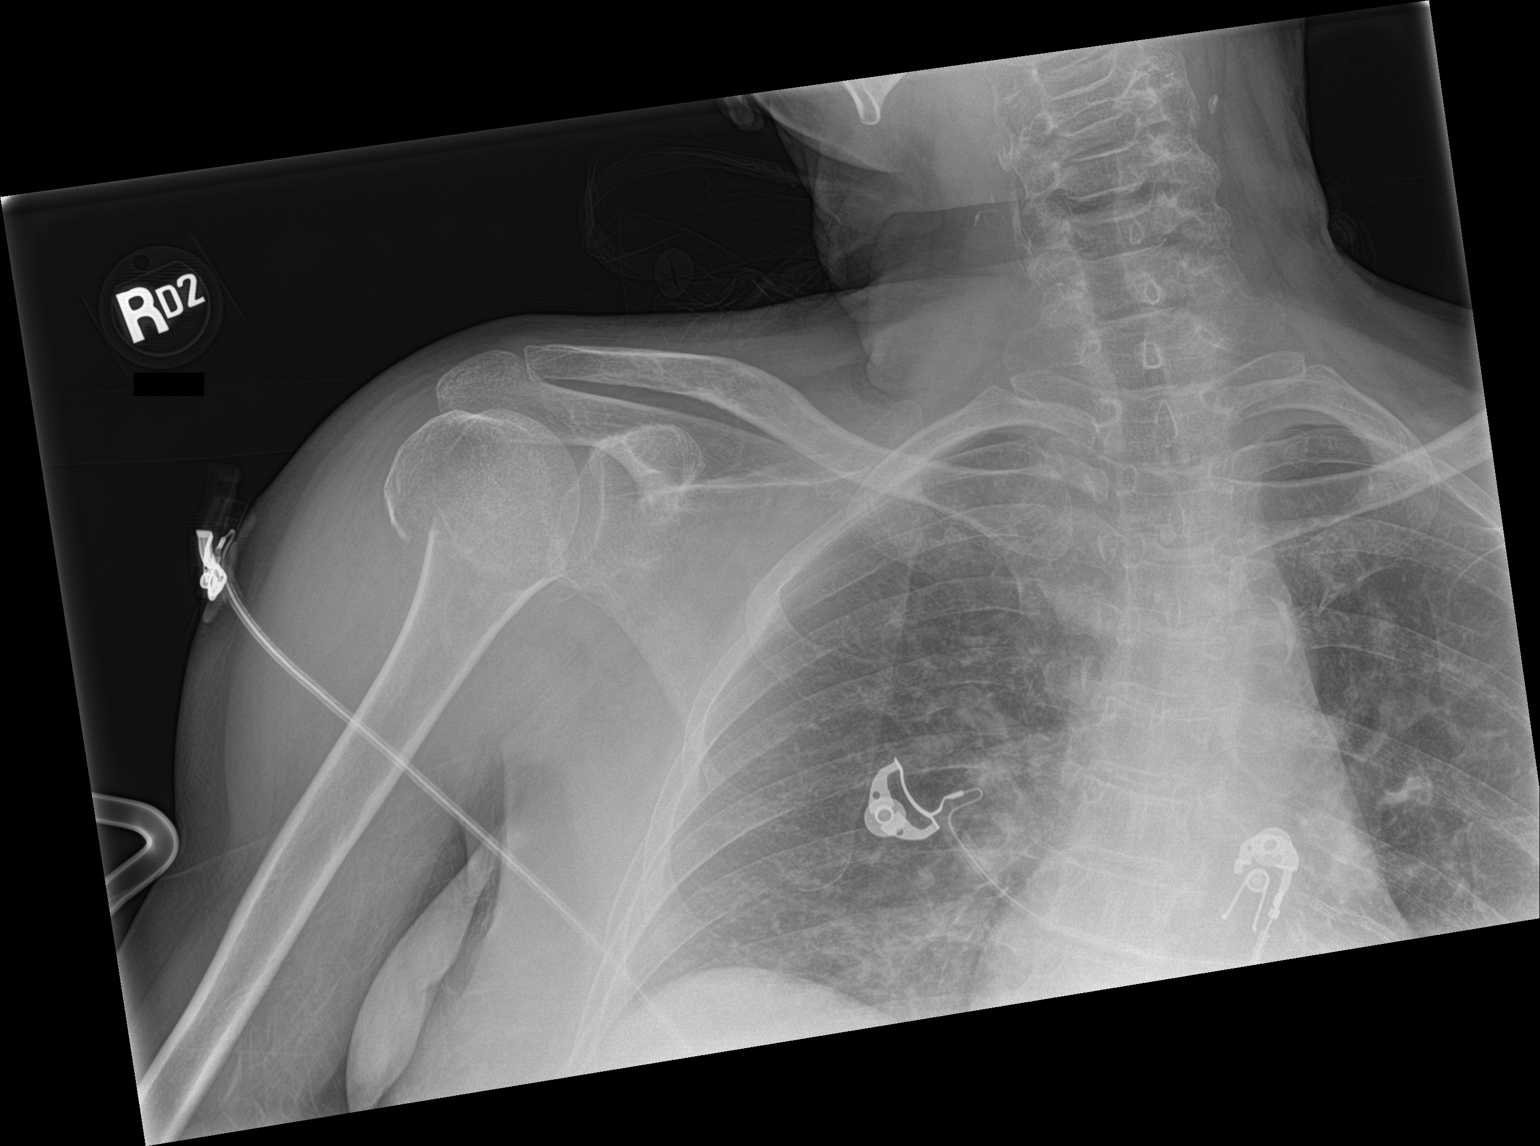

[1 of 1 positions shown; findings below may reference images not displayed]

FINDINGS: Displaced fracture of the right proximal humerus primarily involving
the surgical neck. Fracture is displaced by at least 6 mm. No
obvious dislocation on this portable AP view. The acromioclavicular
joint is congruent.
IMPRESSION: Displaced right proximal humerus fracture primarily involving the
surgical neck.

## 2021-10-31 ENCOUNTER — Other Ambulatory Visit: Payer: Self-pay

## 2021-10-31 ENCOUNTER — Ambulatory Visit (INDEPENDENT_AMBULATORY_CARE_PROVIDER_SITE_OTHER): Payer: Medicare HMO | Admitting: Physician Assistant

## 2021-10-31 ENCOUNTER — Ambulatory Visit: Payer: Medicare HMO

## 2021-10-31 ENCOUNTER — Encounter: Payer: Self-pay | Admitting: Physician Assistant

## 2021-10-31 VITALS — BP 130/72 | HR 61 | Temp 97.5°F | Ht 59.5 in | Wt 111.2 lb

## 2021-10-31 DIAGNOSIS — Z794 Long term (current) use of insulin: Secondary | ICD-10-CM | POA: Diagnosis not present

## 2021-10-31 DIAGNOSIS — I10 Essential (primary) hypertension: Secondary | ICD-10-CM | POA: Diagnosis not present

## 2021-10-31 DIAGNOSIS — K219 Gastro-esophageal reflux disease without esophagitis: Secondary | ICD-10-CM

## 2021-10-31 DIAGNOSIS — E039 Hypothyroidism, unspecified: Secondary | ICD-10-CM | POA: Diagnosis not present

## 2021-10-31 DIAGNOSIS — E119 Type 2 diabetes mellitus without complications: Secondary | ICD-10-CM

## 2021-10-31 DIAGNOSIS — Z23 Encounter for immunization: Secondary | ICD-10-CM

## 2021-10-31 DIAGNOSIS — E782 Mixed hyperlipidemia: Secondary | ICD-10-CM

## 2021-10-31 MED ORDER — DICYCLOMINE HCL 10 MG PO CAPS: 10.0000 mg | ORAL_CAPSULE | Freq: Three times a day (TID) | ORAL | 1 refills | Status: DC

## 2021-11-01 LAB — LIPID PANEL
Chol/HDL Ratio: 2.7 ratio (ref 0.0–4.4)
Cholesterol, Total: 115 mg/dL (ref 100–199)
HDL: 42 mg/dL (ref >39)
LDL Chol Calc (NIH): 50 mg/dL (ref 0–99)
Triglycerides: 127 mg/dL (ref 0–149)
VLDL Cholesterol Cal: 23 mg/dL (ref 5–40)

## 2021-11-01 LAB — COMPREHENSIVE METABOLIC PANEL
ALT: 47 IU/L — ABNORMAL HIGH (ref 0–32)
AST: 26 IU/L (ref 0–40)
Albumin/Globulin Ratio: 2.1 (ref 1.2–2.2)
Albumin: 4.2 g/dL (ref 3.7–4.7)
Alkaline Phosphatase: 126 IU/L — ABNORMAL HIGH (ref 44–121)
BUN/Creatinine Ratio: 18 (ref 12–28)
BUN: 16 mg/dL (ref 8–27)
Bilirubin Total: 0.8 mg/dL (ref 0.0–1.2)
CO2: 27 mmol/L (ref 20–29)
Calcium: 9.7 mg/dL (ref 8.7–10.3)
Chloride: 104 mmol/L (ref 96–106)
Creatinine, Ser: 0.88 mg/dL (ref 0.57–1.00)
Globulin, Total: 2 g/dL (ref 1.5–4.5)
Glucose: 110 mg/dL — ABNORMAL HIGH (ref 70–99)
Potassium: 4.2 mmol/L (ref 3.5–5.2)
Sodium: 144 mmol/L (ref 134–144)
Total Protein: 6.2 g/dL (ref 6.0–8.5)
eGFR: 68 mL/min/{1.73_m2} (ref >59)

## 2021-11-01 LAB — CBC WITH DIFFERENTIAL/PLATELET
Basophils Absolute: 0.1 10*3/uL (ref 0.0–0.2)
Basos: 1 %
EOS (ABSOLUTE): 0.3 10*3/uL (ref 0.0–0.4)
Eos: 4 %
Hematocrit: 34.2 % (ref 34.0–46.6)
Hemoglobin: 11.3 g/dL (ref 11.1–15.9)
Immature Grans (Abs): 0 10*3/uL (ref 0.0–0.1)
Immature Granulocytes: 0 %
Lymphocytes Absolute: 1.6 10*3/uL (ref 0.7–3.1)
Lymphs: 18 %
MCH: 31.4 pg (ref 26.6–33.0)
MCHC: 33 g/dL (ref 31.5–35.7)
MCV: 95 fL (ref 79–97)
Monocytes Absolute: 0.8 10*3/uL (ref 0.1–0.9)
Monocytes: 9 %
Neutrophils Absolute: 6.1 10*3/uL (ref 1.4–7.0)
Neutrophils: 68 %
Platelets: 341 10*3/uL (ref 150–450)
RBC: 3.6 x10E6/uL — ABNORMAL LOW (ref 3.77–5.28)
RDW: 13.4 % (ref 11.7–15.4)
WBC: 9 10*3/uL (ref 3.4–10.8)

## 2021-11-01 LAB — CARDIOVASCULAR RISK ASSESSMENT

## 2021-11-01 LAB — TSH: TSH: 2.99 u[IU]/mL (ref 0.450–4.500)

## 2021-11-01 LAB — HEMOGLOBIN A1C
Est. average glucose Bld gHb Est-mCnc: 143 mg/dL
Hgb A1c MFr Bld: 6.6 % — ABNORMAL HIGH (ref 4.8–5.6)

## 2021-11-02 NOTE — Progress Notes (Signed)
Established Patient Office Visit  Subjective:  Patient ID: Nancy Lang, female    DOB: 12-09-1945  Age: 76 y.o. MRN: [id number]  CC:  Chief Complaint  Patient presents with   hypertension    HPI Nancy Lang presents for chronic follow up   Mixed hyperlipidemia  Pt presents with hyperlipidemia. . Compliance with treatment has been good The patient is compliant with medications, maintains a low cholesterol diet , follows up as directed ,  The patient denies experiencing any hypercholesterolemia related symptoms.  She is currently taking lipitor 25m qd  Pt presents for follow up of hypertension.  The patient is tolerating the medication well without side effects. Compliance with treatment has been good; including taking medication as directed , maintains a healthy diet and regular exercise regimen , and following up as directed. Pt currently on zestril 160mqd and metoprolol 100bid- family states that she has not seen cardiology in awhile and is due for appt with them - will schedule  Dementia: She is here for follow upt of cognitive problems. She is accompanied by  daughter. And granddaughter - Primary caregiversi are them -  they state that her symptoms are stable at this time Functional Assessment:  Activities of Daily Living (ADLs):   She is independent in the following: feeding Requires assistance with the following: bathing and hygiene, toileting and dressing Currently on Namenda 1055md and aricept 49m35m --- is currently following with neurology  Hypothyroidism: Patient presents for evaluation of thyroid function. She has had some recent fatigue - she is currently on levothyroxine 50mc92m  Pt for follow up for diabetes - states glucose ranges are 'all over the place' recently - daughter states in the mornings mostly ranges are from 70-180 - she gets lantus 25 units daily at this time  Pt with history of low vitamin D - due for labwork  Pt with history of GERD -  she is currently on protonix - states she is doing ok on this medication but is out of benty and requests refill  She would like to get her flu vaccine today  Past Medical History:  Diagnosis Date   Acquired hypothyroidism 12/31/2015   Acute cystitis without hematuria 06/12/2020   Acute laryngopharyngitis 05/23/2020   Angina pectoris (HCC) [place] 06[place]12[place]2020   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, stenosis left P2   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, 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   Coronary artery disease involving native coronary artery of native heart 10/07/2016   Overview:  PTCA and drug-eluting stents to mid proximal portion to LAD and mid and proximal portion of circumflex in 2017 Drug-eluting stent mid LAD in May 2018   Dementia without behavioral disturbance (HCC) [place]6/2018   Diabetes mellitus without complication (HCC)    Dyslipidemia (high LDL; low HDL) 10/07/2016   Essential hypertension, benign 10/07/2016   GERD (gastroesophageal reflux disease)    Hyperlipidemia    Hypertension    Insulin dependent type 2 diabetes mellitus, controlled (HCC) [place]13/2017   Malaise 01/20/2021   Memory impairment 03/10/2017   Mixed hyperlipidemia 12/06/2019   NSTEMI (non-ST elevated myocardial infarction) (HCC) [place]13/2017   Thyroid disease    Vitamin D deficiency 03/08/2017    Past Surgical History:  Procedure Laterality Date   CARDIAC CATHETERIZATION      Family History  Problem Relation Age of Onset   Diabetes Mother    Cancer Mother    Heart  disease Mother    Heart disease Father    Alcohol abuse Father     Social History   Socioeconomic History   Marital status: Widowed    Spouse name: Not on file   Number of children: Not on file   Years of education: Not on file   Highest education level: Not on file  Occupational History   Occupation: retired  Tobacco Use   Smoking status: Never   Smokeless tobacco: Current    Types: Snuff   Tobacco  comments:    one can a week  Vaping Use   Vaping Use: Never used  Substance and Sexual Activity   Alcohol use: No   Drug use: No   Sexual activity: Not Currently  Other Topics Concern   Not on file  Social History Narrative   Not on file   Social Determinants of Health   Financial Resource Strain: Not on file  Food Insecurity: Not on file  Transportation Needs: Not on file  Physical Activity: Not on file  Stress: Not on file  Social Connections: Not on file  Intimate Partner Violence: Not on file     Current Outpatient Medications:    Accu-Chek FastClix Lancets MISC, Just Lancets Unknown size, Disp: , Rfl:    ascorbic acid (VITAMIN C) 500 MG tablet, Take 500 mg by mouth daily., Disp: , Rfl:    aspirin 81 MG chewable tablet, Chew 81 mg by mouth daily. , Disp: , Rfl:    atorvastatin (LIPITOR) 80 MG tablet, TAKE 1 TABLET AT BEDTIME, Disp: 90 tablet, Rfl: 0   Calcium-Magnesium-Zinc (CAL-MAG-ZINC PO), Take 1 tablet by mouth daily with breakfast. Unknown strength per patient, Disp: , Rfl:    Cholecalciferol (VITAMIN D3) 1000 units CAPS, Take 1,000 Units by mouth daily. , Disp: , Rfl:    donepezil (ARICEPT) 10 MG tablet, TAKE 1 TABLET(10 MG) BY MOUTH AT BEDTIME, Disp: 90 tablet, Rfl: 1   DROPLET PEN NEEDLES 32G X 4 MM MISC, USE AS DIRECTED ONE TIME DAILY, Disp: 100 each, Rfl: 3   glucose blood (ACCU-CHEK GUIDE) test strip, CHECK BLOOD GLUCOSE THREE TIMES DAILY, Disp: 300 strip, Rfl: 1   isosorbide mononitrate (IMDUR) 30 MG 24 hr tablet, TAKE 1 TABLET EVERY DAY, Disp: 90 tablet, Rfl: 1   LANTUS SOLOSTAR 100 UNIT/ML Solostar Pen, INJECT 25 UNITS INTO THE SKIN AT BEDTIME., Disp: 30 mL, Rfl: 1   levothyroxine (SYNTHROID) 50 MCG tablet, TAKE 1 TABLET EVERY DAY, Disp: 90 tablet, Rfl: 1   lisinopril (ZESTRIL) 10 MG tablet, TAKE 1 TABLET EVERY DAY, Disp: 90 tablet, Rfl: 0   memantine (NAMENDA) 10 MG tablet, TAKE 1 TABLET TWICE DAILY, Disp: 180 tablet, Rfl: 0   metoprolol tartrate (LOPRESSOR)  100 MG tablet, Take 100 mg by mouth 2 (two) times daily., Disp: , Rfl:    montelukast (SINGULAIR) 10 MG tablet, TAKE 1 TABLET EVERY DAY, Disp: 90 tablet, Rfl: 3   Multiple Vitamins-Minerals (ONE-A-DAY PROACTIVE 65+) TABS, Take 1 tablet by mouth daily with breakfast. Unknown strength per patient, Disp: , Rfl:    nitrofurantoin, macrocrystal-monohydrate, (MACROBID) 100 MG capsule, TAKE 1 CAPSULE EVERY DAY  WITH  BREAKFAST, Disp: 90 capsule, Rfl: 0   Omega-3 1000 MG CAPS, Take 1,000 mg by mouth daily. , Disp: , Rfl:    pantoprazole (PROTONIX) 40 MG tablet, TAKE 1 TABLET EVERY DAY, Disp: 90 tablet, Rfl: 1   potassium gluconate 595 (99 K) MG TABS tablet, Take 595 mg by mouth daily., Disp: ,  Rfl:    dicyclomine (BENTYL) 10 MG capsule, Take 1 capsule (10 mg total) by mouth 3 (three) times daily before meals., Disp: 270 capsule, Rfl: 1   Allergies  Allergen Reactions   Pravastatin     ANGER AND MEMORY ISSUES   Ativan [Lorazepam] Anxiety and Other (See Comments)    Made the patient "paranoid and mean" (per family)- also worsened anxiety   Fenofibrate Rash   Oxytetracycline Rash   Penicillins Rash   Septra [Sulfamethoxazole-Trimethoprim] Rash   Sulfa Antibiotics Rash   Tetracyclines & Related Rash   CONSTITUTIONAL: Negative for chills, fatigue, fever, unintentional weight gain and unintentional weight loss.  E/N/T: Negative for ear pain, nasal congestion and sore throat.  CARDIOVASCULAR: Negative for chest pain, dizziness, palpitations and pedal edema.  RESPIRATORY: Negative for recent cough and dyspnea.  GASTROINTESTINAL: Negative for abdominal pain, acid reflux symptoms, constipation, diarrhea, nausea and vomiting.  MSK: Negative for arthralgias and myalgias.  INTEGUMENTARY: Negative for rash.  NEUROLOGICAL: Negative for dizziness and headaches.  PSYCHIATRIC: see HPI    Objective:  PHYSICAL EXAM:   VS: BP 130/72 (BP Location: Left Arm, Patient Position: Sitting, Cuff Size: Normal)     Pulse 61    Temp (!) 97.5 F (36.4 C) (Temporal)    Ht 4' 11.5" (1.511 m)    Wt 111 lb 3.2 oz (50.4 kg)    SpO2 95%    BMI 22.08 kg/m   GEN: Well nourished, well developed, in no acute distress  Cardiac: RRR; no murmurs, rubs, or gallops,no edema -  Respiratory:  normal respiratory rate and pattern with no distress - normal breath sounds with no rales, rhonchi, wheezes or rubs GI: normal bowel sounds, no masses or tenderness MS: no deformity or atrophy  Skin: warm and dry, no rash  Neuro:  Alert and Oriented today Psych: euthymic mood, appropriate affect and demeanor  Office Visit on 10/31/2021  Component Date Value Ref Range Status   WBC 10/31/2021 9.0  3.4 - 10.8 x10E3/uL Final   RBC 10/31/2021 3.60 (L)  3.77 - 5.28 x10E6/uL Final   Hemoglobin 10/31/2021 11.3  11.1 - 15.9 g/dL Final   Hematocrit 10/31/2021 34.2  34.0 - 46.6 % Final   MCV 10/31/2021 95  79 - 97 fL Final   MCH 10/31/2021 31.4  26.6 - 33.0 pg Final   MCHC 10/31/2021 33.0  31.5 - 35.7 g/dL Final   RDW 10/31/2021 13.4  11.7 - 15.4 % Final   Platelets 10/31/2021 341  150 - 450 x10E3/uL Final   Neutrophils 10/31/2021 68  Not Estab. % Final   Lymphs 10/31/2021 18  Not Estab. % Final   Monocytes 10/31/2021 9  Not Estab. % Final   Eos 10/31/2021 4  Not Estab. % Final   Basos 10/31/2021 1  Not Estab. % Final   Neutrophils Absolute 10/31/2021 6.1  1.4 - 7.0 x10E3/uL Final   Lymphocytes Absolute 10/31/2021 1.6  0.7 - 3.1 x10E3/uL Final   Monocytes Absolute 10/31/2021 0.8  0.1 - 0.9 x10E3/uL Final   EOS (ABSOLUTE) 10/31/2021 0.3  0.0 - 0.4 x10E3/uL Final   Basophils Absolute 10/31/2021 0.1  0.0 - 0.2 x10E3/uL Final   Immature Granulocytes 10/31/2021 0  Not Estab. % Final   Immature Grans (Abs) 10/31/2021 0.0  0.0 - 0.1 x10E3/uL Final   Glucose 10/31/2021 110 (H)  70 - 99 mg/dL Final   BUN 10/31/2021 16  8 - 27 mg/dL Final   Creatinine, Ser 10/31/2021 0.88  0.57 -  1.00 mg/dL Final   eGFR 10/31/2021 68  >59 mL/min/1.73 Final    BUN/Creatinine Ratio 10/31/2021 18  12 - 28 Final   Sodium 10/31/2021 144  134 - 144 mmol/L Final   Potassium 10/31/2021 4.2  3.5 - 5.2 mmol/L Final   Chloride 10/31/2021 104  96 - 106 mmol/L Final   CO2 10/31/2021 27  20 - 29 mmol/L Final   Calcium 10/31/2021 9.7  8.7 - 10.3 mg/dL Final   Total Protein 10/31/2021 6.2  6.0 - 8.5 g/dL Final   Albumin 10/31/2021 4.2  3.7 - 4.7 g/dL Final   Globulin, Total 10/31/2021 2.0  1.5 - 4.5 g/dL Final   Albumin/Globulin Ratio 10/31/2021 2.1  1.2 - 2.2 Final   Bilirubin Total 10/31/2021 0.8  0.0 - 1.2 mg/dL Final   Alkaline Phosphatase 10/31/2021 126 (H)  44 - 121 IU/L Final   AST 10/31/2021 26  0 - 40 IU/L Final   ALT 10/31/2021 47 (H)  0 - 32 IU/L Final   TSH 10/31/2021 2.990  0.450 - 4.500 uIU/mL Final   Cholesterol, Total 10/31/2021 115  100 - 199 mg/dL Final   Triglycerides 10/31/2021 127  0 - 149 mg/dL Final   HDL 10/31/2021 42  >39 mg/dL Final   VLDL Cholesterol Cal 10/31/2021 23  5 - 40 mg/dL Final   LDL Chol Calc (NIH) 10/31/2021 50  0 - 99 mg/dL Final   Chol/HDL Ratio 10/31/2021 2.7  0.0 - 4.4 ratio Final   Comment:                                   T. Chol/HDL Ratio                                             Men  Women                               1/2 Avg.Risk  3.4    3.3                                   Avg.Risk  5.0    4.4                                2X Avg.Risk  9.6    7.1                                3X Avg.Risk 23.4   11.0    Hgb A1c MFr Bld 10/31/2021 6.6 (H)  4.8 - 5.6 % Final   Comment:          Prediabetes: 5.7 - 6.4          Diabetes: >6.4          Glycemic control for adults with diabetes: <7.0    Est. average glucose Bld gHb Est-m* 10/31/2021 143  mg/dL Final   Interpretation 10/31/2021 Note   Final   Supplemental report is available.     Health Maintenance Due  Topic Date Due   Zoster Vaccines- Shingrix (  1 of 2) Never done   Pneumonia Vaccine 23+ Years old (2 - PPSV23 if available, else PCV20)  12/25/2014   MAMMOGRAM  07/31/2020   TETANUS/TDAP  04/25/2021   OPHTHALMOLOGY EXAM  05/13/2021    There are no preventive care reminders to display for this patient.  Lab Results  Component Value Date   TSH 2.990 10/31/2021   Lab Results  Component Value Date   WBC 9.0 10/31/2021   HGB 11.3 10/31/2021   HCT 34.2 10/31/2021   MCV 95 10/31/2021   PLT 341 10/31/2021   Lab Results  Component Value Date   NA 144 10/31/2021   K 4.2 10/31/2021   CO2 27 10/31/2021   GLUCOSE 110 (H) 10/31/2021   BUN 16 10/31/2021   CREATININE 0.88 10/31/2021   BILITOT 0.8 10/31/2021   ALKPHOS 126 (H) 10/31/2021   AST 26 10/31/2021   ALT 47 (H) 10/31/2021   PROT 6.2 10/31/2021   ALBUMIN 4.2 10/31/2021   CALCIUM 9.7 10/31/2021   EGFR 68 10/31/2021   Lab Results  Component Value Date   CHOL 115 10/31/2021   Lab Results  Component Value Date   HDL 42 10/31/2021   Lab Results  Component Value Date   LDLCALC 50 10/31/2021   Lab Results  Component Value Date   TRIG 127 10/31/2021   Lab Results  Component Value Date   CHOLHDL 2.7 10/31/2021   Lab Results  Component Value Date   HGBA1C 6.6 (H) 10/31/2021      Assessment & Plan:   Problem List Items Addressed This Visit       Cardiovascular and Mediastinum   Coronary artery disease involving native coronary artery of native heart Continue current meds and will make cardiology follow up appt    Essential hypertension, benign - Primary   Relevant Orders   CBC with Differential/Platelet   Comprehensive metabolic panel Continue meds as directed     Digestive   GERD (gastroesophageal reflux disease)   Relevant Orders   Ambulatory referral to Gastroenterology Continue Protonix     Endocrine   Acquired hypothyroidism   Relevant Orders   TSH Continue synthroid   Insulin dependent type 2 diabetes mellitus, controlled ([place])   Relevant Orders   CBC with Differential/Platelet   Comprehensive metabolic panel   Lipid panel    Hemoglobin A1c Continue lantus as directed     Other   Hyperlipidemia Continue meds and watch diet    Vitamin D insufficiency   Relevant Orders   VITAMIN D 25 Hydroxy (Vit-D Deficiency, Fractures)  Need for flu vaccine Fluad given       Meds ordered this encounter  Medications   dicyclomine (BENTYL) 10 MG capsule    Sig: Take 1 capsule (10 mg total) by mouth 3 (three) times daily before meals.    Dispense:  270 capsule    Refill:  1    Order Specific Question:   Supervising Provider    Answer:   Shelton Silvas     Follow-up: Return in about 3 months (around 01/29/2022) for chronic fasting follow up.    SARA R Charita Lindenberger, PA-C

## 2021-11-20 ENCOUNTER — Other Ambulatory Visit: Payer: Self-pay | Admitting: Physician Assistant

## 2021-11-25 DIAGNOSIS — K219 Gastro-esophageal reflux disease without esophagitis: Secondary | ICD-10-CM | POA: Diagnosis not present

## 2021-11-25 DIAGNOSIS — K580 Irritable bowel syndrome with diarrhea: Secondary | ICD-10-CM | POA: Diagnosis not present

## 2021-11-25 DIAGNOSIS — Z1212 Encounter for screening for malignant neoplasm of rectum: Secondary | ICD-10-CM | POA: Diagnosis not present

## 2021-12-05 ENCOUNTER — Other Ambulatory Visit: Payer: Self-pay | Admitting: Physician Assistant

## 2021-12-05 DIAGNOSIS — E782 Mixed hyperlipidemia: Secondary | ICD-10-CM

## 2021-12-22 ENCOUNTER — Other Ambulatory Visit: Payer: Self-pay | Admitting: Physician Assistant

## 2021-12-22 DIAGNOSIS — N3000 Acute cystitis without hematuria: Secondary | ICD-10-CM

## 2022-01-05 ENCOUNTER — Other Ambulatory Visit: Payer: Self-pay | Admitting: Physician Assistant

## 2022-01-05 ENCOUNTER — Other Ambulatory Visit: Payer: Self-pay | Admitting: Family Medicine

## 2022-01-05 DIAGNOSIS — I10 Essential (primary) hypertension: Secondary | ICD-10-CM

## 2022-01-05 MED ORDER — METOPROLOL TARTRATE 100 MG PO TABS: 100.0000 mg | ORAL_TABLET | Freq: Two times a day (BID) | ORAL | 0 refills | Status: DC

## 2022-01-05 NOTE — Telephone Encounter (Signed)
Called family. Patient has been taking metoprolol 100 mg twice daily.  ? ?Nancy Lang 01/05/22 3:53 PM ? ?

## 2022-01-05 NOTE — Telephone Encounter (Signed)
Please call pt family and clarify if she is taking lopressor '100mg'$  bid or lopressor '50mg'$  bid ?

## 2022-01-25 DIAGNOSIS — M25551 Pain in right hip: Secondary | ICD-10-CM | POA: Diagnosis not present

## 2022-01-25 DIAGNOSIS — M1611 Unilateral primary osteoarthritis, right hip: Secondary | ICD-10-CM | POA: Diagnosis not present

## 2022-01-27 ENCOUNTER — Encounter: Payer: Self-pay | Admitting: Physician Assistant

## 2022-01-27 ENCOUNTER — Ambulatory Visit (INDEPENDENT_AMBULATORY_CARE_PROVIDER_SITE_OTHER): Payer: Medicare HMO | Admitting: Physician Assistant

## 2022-01-27 VITALS — BP 182/68 | HR 55 | Temp 98.1°F | Resp 18 | Ht 59.5 in | Wt 107.2 lb

## 2022-01-27 DIAGNOSIS — E119 Type 2 diabetes mellitus without complications: Secondary | ICD-10-CM | POA: Diagnosis not present

## 2022-01-27 DIAGNOSIS — R300 Dysuria: Secondary | ICD-10-CM

## 2022-01-27 DIAGNOSIS — M25551 Pain in right hip: Secondary | ICD-10-CM | POA: Diagnosis not present

## 2022-01-27 DIAGNOSIS — Z794 Long term (current) use of insulin: Secondary | ICD-10-CM

## 2022-01-27 LAB — POCT URINALYSIS DIP (CLINITEK)
Bilirubin, UA: NEGATIVE
Blood, UA: NEGATIVE
Glucose, UA: >=1000 mg/dL — AB
Leukocytes, UA: NEGATIVE
Nitrite, UA: NEGATIVE
POC PROTEIN,UA: >=300 — AB
Spec Grav, UA: >=1.03 — AB (ref 1.010–1.025)
Urobilinogen, UA: 0.2 E.U./dL
pH, UA: 5 (ref 5.0–8.0)

## 2022-01-27 MED ORDER — LANTUS SOLOSTAR 100 UNIT/ML ~~LOC~~ SOPN: PEN_INJECTOR | SUBCUTANEOUS | 1 refills | Status: DC

## 2022-01-27 NOTE — Progress Notes (Signed)
? ?Subjective:  ?Patient ID: Nancy Lang, female    DOB: 07-22-1946  Age: 76 y.o. MRN: [id number] ? ?Chief Complaint  ?Patient presents with  ? Hospitalization Follow-up  ?  [facility] 4/2  ? Dysuria  ? ? ?HPI ? Pt has had some dysuria for the past few days - no fever or malaise ?Pt with history of chronic recurrent utis in past ? 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 Accu-Chek FastClix Lancets MISC Just Lancets Unknown size    ? ascorbic acid (VITAMIN C) 500 MG tablet Take 500 mg by mouth daily.    ? aspirin 81 MG chewable tablet Chew 81 mg by mouth daily.     ? atorvastatin (LIPITOR) 80 MG tablet TAKE 1 TABLET AT BEDTIME 90 tablet 0  ? Calcium-Magnesium-Zinc (CAL-MAG-ZINC PO) Take 1 tablet by mouth daily with breakfast. Unknown strength per patient    ? Cholecalciferol (VITAMIN D3) 1000 units CAPS Take 1,000 Units by mouth daily.     ? dicyclomine (BENTYL) 10 MG capsule Take 1 capsule (10 mg total) by mouth 3 (three) times daily before meals. 270 capsule 1  ? donepezil (ARICEPT) 10 MG tablet TAKE 1 TABLET(10 MG) BY MOUTH AT BEDTIME 90 tablet 1  ? DROPLET PEN NEEDLES 32G X 4 MM MISC USE AS DIRECTED ONE TIME DAILY 100 each 3  ? glucose blood (ACCU-CHEK GUIDE) test strip CHECK BLOOD GLUCOSE THREE TIMES DAILY 300 strip 1  ? isosorbide mononitrate (IMDUR) 30 MG 24 hr tablet TAKE 1 TABLET EVERY DAY 90 tablet 1  ? levothyroxine (SYNTHROID) 50 MCG tablet TAKE 1 TABLET EVERY DAY 90 tablet 1  ? lisinopril (ZESTRIL) 10 MG tablet TAKE 1 TABLET EVERY DAY 90 tablet 0  ? memantine (NAMENDA) 10 MG tablet TAKE 1 TABLET TWICE DAILY 180 tablet 0  ? metoprolol tartrate (LOPRESSOR) 100 MG tablet Take 1 tablet (100 mg total) by mouth 2 (two) times daily. 180 tablet 0  ?  montelukast (SINGULAIR) 10 MG tablet TAKE 1 TABLET EVERY DAY 90 tablet 3  ? Multiple Vitamins-Minerals (ONE-A-DAY PROACTIVE 65+) TABS Take 1 tablet by mouth daily with breakfast. Unknown strength per patient    ? nitrofurantoin, macrocrystal-monohydrate, (MACROBID) 100 MG capsule TAKE 1 CAPSULE EVERY DAY WITH BREAKFAST 90 capsule 0  ? Omega-3 1000 MG CAPS Take 1,000 mg by mouth daily.     ? pantoprazole (PROTONIX) 40 MG tablet TAKE 1 TABLET EVERY DAY 90 tablet 1  ? potassium gluconate 595 (99 K) MG TABS tablet Take 595 mg by mouth daily.    ? ?No current facility-administered medications on file prior to visit.  ? ?Past Medical History:  ?Diagnosis Date  ? Acquired hypothyroidism 12/31/2015  ? Acute cystitis without hematuria 06/12/2020  ? Acute laryngopharyngitis 05/23/2020  ? Angina pectoris ([place])   ? Atypical nevus 06/12/2020  ? 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 Cerebral hemorrhage ([place]) 05/05/2018  ? 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 Coronary artery disease involving native coronary artery of native heart 10/07/2016  ? Overview:  PTCA and drug-eluting stents to mid proximal portion to LAD and mid and proximal portion of circumflex in 2017 Drug-eluting stent mid LAD in  May 2018  ? Dementia without behavioral disturbance ([place]) 03/10/2017  ? Diabetes mellitus without complication ([place])   ? Dyslipidemia (high LDL; low HDL) 10/07/2016  ? Essential hypertension, benign 10/07/2016  ? GERD (gastroesophageal reflux disease)   ? Hyperlipidemia   ? Hypertension   ? Insulin dependent type 2 diabetes mellitus, controlled ([place]) 10/07/2016  ? Malaise 01/20/2021  ? Memory impairment 03/10/2017  ? Mixed hyperlipidemia 12/06/2019  ? NSTEMI (non-ST elevated myocardial infarction) ([place]) 09/07/2016  ? Thyroid disease   ? Vitamin D deficiency 03/08/2017  ? ?Past Surgical History:  ?Procedure Laterality Date  ? CARDIAC CATHETERIZATION    ?  ?Family History  ?Problem Relation Age of Onset  ? Diabetes Mother    ? Cancer Mother   ? Heart disease Mother   ? Heart disease Father   ? Alcohol abuse Father   ? ?Social History  ? ?Socioeconomic History  ? Marital status: Widowed  ?  Spouse name: Not on file  ? Number of children: Not on file  ? Years of education: Not on file  ? Highest education level: Not on file  ?Occupational History  ? Occupation: retired  ?Tobacco Use  ? Smoking status: Never  ? Smokeless tobacco: Current  ?  Types: Snuff  ? Tobacco comments:  ?  one can a week  ?Vaping Use  ? Vaping Use: Never used  ?Substance and Sexual Activity  ? Alcohol use: No  ? Drug use: No  ? Sexual activity: Not Currently  ?Other Topics Concern  ? Not on file  ?Social History Narrative  ? Not on file  ? ?Social Determinants of Health  ? ?Financial Resource Strain: Not on file  ?Food Insecurity: Not on file  ?Transportation Needs: Not on file  ?Physical Activity: Not on file  ?Stress: Not on file  ?Social Connections: Not on file  ? ? ?Review of Systems ? ?CONSTITUTIONAL: Negative for chills, fatigue, fever, unintentional weight gain and unintentional weight loss.  ? ?CARDIOVASCULAR: Negative for chest pain, dizziness, palpitations and pedal edema.  ?RESPIRATORY: Negative for recent cough and dyspnea.  ?GASTROINTESTINAL: Negative for abdominal pain, acid reflux symptoms, constipation, diarrhea, nausea and vomiting.  ?MSK: see HPI ?GU - see HPI ?INTEGUMENTARY: Negative for rash.   ? ?   ? ?Objective:  ?BP (!) 182/68   Pulse (!) 55   Temp 98.1 ?F (36.7 ?C)   Resp 18   Ht 4' 11.5" (1.511 m)   Wt 107 lb 3.2 oz (48.6 kg)   SpO2 96%   BMI 21.29 kg/m?  ? ? ?  01/27/2022  ?  4:01 PM 10/31/2021  ? 10:21 AM 07/23/2021  ?  9:54 AM  ?BP/Weight  ?Systolic BP 732 202 542  ?Diastolic BP 68 72 82  ?Wt. (Lbs) 107.2 111.2 107  ?BMI 21.29 kg/m2 22.08 kg/m2 21.25 kg/m2  ? ? ?Physical Exam ?PHYSICAL EXAM:  ? ?VS: BP (!) 182/68   Pulse (!) 55   Temp 98.1 ?F (36.7 ?C)   Resp 18   Ht 4' 11.5" (1.511 m)   Wt 107 lb 3.2 oz (48.6 kg)   SpO2 96%    BMI 21.29 kg/m?  ? ?GEN: Well nourished, well developed, in no acute distress  ?Cardiac: RRR; no murmurs, ?Respiratory:  normal respiratory rate and pattern with no distress - normal breath sounds with no rales, rhonchi, wheezes or rubs ?MS: no deformity or atrophy  ?Skin: warm and dry, no rash  ?Psych: euthymic mood, appropriate affect and demeanor ? ?  Diabetic Foot Exam - Simple   ?No data filed ?  ?  ? ?Lab Results  ?Component Value Date  ? WBC 9.0 10/31/2021  ? HGB 11.3 10/31/2021  ? HCT 34.2 10/31/2021  ? PLT 341 10/31/2021  ? GLUCOSE 110 (H) 10/31/2021  ? CHOL 115 10/31/2021  ? TRIG 127 10/31/2021  ? HDL 42 10/31/2021  ? [place] 50 10/31/2021  ? ALT 47 (H) 10/31/2021  ? AST 26 10/31/2021  ? NA 144 10/31/2021  ? K 4.2 10/31/2021  ? CL 104 10/31/2021  ? CREATININE 0.88 10/31/2021  ? BUN 16 10/31/2021  ? CO2 27 10/31/2021  ? TSH 2.990 10/31/2021  ? HGBA1C 6.6 (H) 10/31/2021  ? MICROALBUR 150 06/12/2020  ? ? ? ? ?Assessment & Plan:  ? ?Problem List Items Addressed This Visit   ? ?  ? Endocrine  ? Insulin dependent type 2 diabetes mellitus, controlled ([place])  ? Relevant Medications  ? insulin glargine (LANTUS SOLOSTAR) 100 UNIT/ML Solostar Pen  ? ?Other Visit Diagnoses   ? ? Dysuria    -  Primary  ? Relevant Orders  ? POCT URINALYSIS DIP (CLINITEK) (Completed)  ? Urine Culture  ? CBC with Differential/Platelet  ? Comprehensive metabolic panel  ? Pain of right hip      ? Relevant Orders  ? CBC with Differential/Platelet  ? Comprehensive metabolic panel  ? Ambulatory referral to Orthopedic Surgery  ? ?  ?. ? ?Meds ordered this encounter  ?Medications  ? insulin glargine (LANTUS SOLOSTAR) 100 UNIT/ML Solostar Pen  ?  Sig: 25 units am and 10 units pm  ?  Dispense:  30 mL  ?  Refill:  1  ?  Order Specific Question:   Supervising Provider  ?  AnswerRochel Brome [[id number]]  ? ? ?Orders Placed This Encounter  ?Procedures  ? Urine Culture  ? CBC with Differential/Platelet  ? Comprehensive metabolic panel  ? Ambulatory  referral to Orthopedic Surgery  ? POCT URINALYSIS DIP (CLINITEK)  ?  ? ?Follow-up: Return in about 3 weeks (around 02/17/2022) for fasting follow up. ? ?An After Visit Summary was printed and given to the patient. ? ?SARA R DAV

## 2022-01-28 ENCOUNTER — Other Ambulatory Visit: Payer: Self-pay | Admitting: Physician Assistant

## 2022-01-28 DIAGNOSIS — R899 Unspecified abnormal finding in specimens from other organs, systems and tissues: Secondary | ICD-10-CM

## 2022-01-28 LAB — COMPREHENSIVE METABOLIC PANEL
ALT: 30 IU/L (ref 0–32)
AST: 14 IU/L (ref 0–40)
Albumin/Globulin Ratio: 1.9 (ref 1.2–2.2)
Albumin: 4.2 g/dL (ref 3.7–4.7)
Alkaline Phosphatase: 139 IU/L — ABNORMAL HIGH (ref 44–121)
BUN/Creatinine Ratio: 28 (ref 12–28)
BUN: 34 mg/dL — ABNORMAL HIGH (ref 8–27)
Bilirubin Total: 0.6 mg/dL (ref 0.0–1.2)
CO2: 24 mmol/L (ref 20–29)
Calcium: 9.8 mg/dL (ref 8.7–10.3)
Chloride: 100 mmol/L (ref 96–106)
Creatinine, Ser: 1.2 mg/dL — ABNORMAL HIGH (ref 0.57–1.00)
Globulin, Total: 2.2 g/dL (ref 1.5–4.5)
Glucose: 432 mg/dL — ABNORMAL HIGH (ref 70–99)
Potassium: 3.9 mmol/L (ref 3.5–5.2)
Sodium: 141 mmol/L (ref 134–144)
Total Protein: 6.4 g/dL (ref 6.0–8.5)
eGFR: 47 mL/min/{1.73_m2} — ABNORMAL LOW (ref >59)

## 2022-01-28 LAB — CBC WITH DIFFERENTIAL/PLATELET
Basophils Absolute: 0 10*3/uL (ref 0.0–0.2)
Basos: 0 %
EOS (ABSOLUTE): 0 10*3/uL (ref 0.0–0.4)
Eos: 0 %
Hematocrit: 35.4 % (ref 34.0–46.6)
Hemoglobin: 11.8 g/dL (ref 11.1–15.9)
Immature Grans (Abs): 0.1 10*3/uL (ref 0.0–0.1)
Immature Granulocytes: 1 %
Lymphocytes Absolute: 1 10*3/uL (ref 0.7–3.1)
Lymphs: 7 %
MCH: 31.1 pg (ref 26.6–33.0)
MCHC: 33.3 g/dL (ref 31.5–35.7)
MCV: 93 fL (ref 79–97)
Monocytes Absolute: 0.8 10*3/uL (ref 0.1–0.9)
Monocytes: 6 %
Neutrophils Absolute: 12.5 10*3/uL — ABNORMAL HIGH (ref 1.4–7.0)
Neutrophils: 86 %
Platelets: 351 10*3/uL (ref 150–450)
RBC: 3.79 x10E6/uL (ref 3.77–5.28)
RDW: 13.6 % (ref 11.7–15.4)
WBC: 14.5 10*3/uL — ABNORMAL HIGH (ref 3.4–10.8)

## 2022-01-28 MED ORDER — CIPROFLOXACIN HCL 250 MG PO TABS: ORAL_TABLET | ORAL | 0 refills | Status: DC

## 2022-01-29 LAB — URINE CULTURE: Organism ID, Bacteria: NO GROWTH

## 2022-01-31 DIAGNOSIS — R079 Chest pain, unspecified: Secondary | ICD-10-CM | POA: Diagnosis not present

## 2022-01-31 DIAGNOSIS — Z96641 Presence of right artificial hip joint: Secondary | ICD-10-CM | POA: Diagnosis not present

## 2022-01-31 DIAGNOSIS — M25551 Pain in right hip: Secondary | ICD-10-CM | POA: Diagnosis not present

## 2022-01-31 DIAGNOSIS — I10 Essential (primary) hypertension: Secondary | ICD-10-CM | POA: Diagnosis not present

## 2022-01-31 DIAGNOSIS — E876 Hypokalemia: Secondary | ICD-10-CM | POA: Diagnosis not present

## 2022-01-31 DIAGNOSIS — Z471 Aftercare following joint replacement surgery: Secondary | ICD-10-CM | POA: Diagnosis not present

## 2022-01-31 DIAGNOSIS — S42292A Other displaced fracture of upper end of left humerus, initial encounter for closed fracture: Secondary | ICD-10-CM | POA: Diagnosis not present

## 2022-01-31 LAB — BASIC METABOLIC PANEL
BUN: 21 (ref 4–21)
CO2: 30 — AB (ref 13–22)
Chloride: 96 — AB (ref 99–108)
Creatinine: 0.6 (ref 0.5–1.1)
Glucose: 204
Potassium: 2.8 mEq/L — AB (ref 3.5–5.1)
Sodium: 136 — AB (ref 137–147)

## 2022-01-31 LAB — HEPATIC FUNCTION PANEL
ALT: 53 U/L — AB (ref 7–35)
AST: 32 (ref 13–35)
Alkaline Phosphatase: 129 — AB (ref 25–125)

## 2022-01-31 LAB — COMPREHENSIVE METABOLIC PANEL
Albumin: 3.8 (ref 3.5–5.0)
Calcium: 8.7 (ref 8.7–10.7)

## 2022-01-31 LAB — CBC AND DIFFERENTIAL
HCT: 41 (ref 36–46)
Hemoglobin: 14.4 (ref 12.0–16.0)
Platelets: 421 10*3/uL — AB (ref 150–400)
WBC: 18.1

## 2022-01-31 LAB — CBC: RBC: 4.74 (ref 3.87–5.11)

## 2022-02-01 ENCOUNTER — Other Ambulatory Visit: Payer: Self-pay | Admitting: Cardiology

## 2022-02-02 ENCOUNTER — Other Ambulatory Visit: Payer: Medicare HMO

## 2022-02-02 DIAGNOSIS — R899 Unspecified abnormal finding in specimens from other organs, systems and tissues: Secondary | ICD-10-CM

## 2022-02-02 LAB — CBC WITH DIFFERENTIAL/PLATELET
Basophils Absolute: 0 10*3/uL (ref 0.0–0.2)
Basos: 0 %
EOS (ABSOLUTE): 0.1 10*3/uL (ref 0.0–0.4)
Eos: 1 %
Hematocrit: 44.3 % (ref 34.0–46.6)
Hemoglobin: 15 g/dL (ref 11.1–15.9)
Immature Grans (Abs): 0.1 10*3/uL (ref 0.0–0.1)
Immature Granulocytes: 1 %
Lymphocytes Absolute: 2.2 10*3/uL (ref 0.7–3.1)
Lymphs: 16 %
MCH: 30.9 pg (ref 26.6–33.0)
MCHC: 33.9 g/dL (ref 31.5–35.7)
MCV: 91 fL (ref 79–97)
Monocytes Absolute: 1.4 10*3/uL — ABNORMAL HIGH (ref 0.1–0.9)
Monocytes: 10 %
Neutrophils Absolute: 10.6 10*3/uL — ABNORMAL HIGH (ref 1.4–7.0)
Neutrophils: 72 %
Platelets: 476 10*3/uL — ABNORMAL HIGH (ref 150–450)
RBC: 4.85 x10E6/uL (ref 3.77–5.28)
RDW: 13.7 % (ref 11.7–15.4)
WBC: 14.5 10*3/uL — ABNORMAL HIGH (ref 3.4–10.8)

## 2022-02-03 ENCOUNTER — Ambulatory Visit: Payer: Medicare HMO | Admitting: Physician Assistant

## 2022-02-10 ENCOUNTER — Encounter: Payer: Self-pay | Admitting: Physician Assistant

## 2022-02-10 ENCOUNTER — Ambulatory Visit (INDEPENDENT_AMBULATORY_CARE_PROVIDER_SITE_OTHER): Payer: Medicare HMO | Admitting: Physician Assistant

## 2022-02-10 VITALS — BP 138/70 | HR 72 | Temp 98.3°F | Ht 59.5 in | Wt 97.0 lb

## 2022-02-10 DIAGNOSIS — R296 Repeated falls: Secondary | ICD-10-CM | POA: Diagnosis not present

## 2022-02-10 DIAGNOSIS — G9389 Other specified disorders of brain: Secondary | ICD-10-CM | POA: Diagnosis not present

## 2022-02-10 DIAGNOSIS — G8929 Other chronic pain: Secondary | ICD-10-CM | POA: Diagnosis not present

## 2022-02-10 DIAGNOSIS — R531 Weakness: Secondary | ICD-10-CM | POA: Diagnosis not present

## 2022-02-10 DIAGNOSIS — E876 Hypokalemia: Secondary | ICD-10-CM

## 2022-02-10 DIAGNOSIS — S43402A Unspecified sprain of left shoulder joint, initial encounter: Secondary | ICD-10-CM | POA: Diagnosis not present

## 2022-02-10 DIAGNOSIS — I998 Other disorder of circulatory system: Secondary | ICD-10-CM | POA: Diagnosis not present

## 2022-02-10 DIAGNOSIS — E039 Hypothyroidism, unspecified: Secondary | ICD-10-CM

## 2022-02-10 DIAGNOSIS — S42212A Unspecified displaced fracture of surgical neck of left humerus, initial encounter for closed fracture: Secondary | ICD-10-CM | POA: Diagnosis not present

## 2022-02-10 DIAGNOSIS — S42292A Other displaced fracture of upper end of left humerus, initial encounter for closed fracture: Secondary | ICD-10-CM | POA: Diagnosis not present

## 2022-02-10 DIAGNOSIS — E119 Type 2 diabetes mellitus without complications: Secondary | ICD-10-CM | POA: Diagnosis not present

## 2022-02-10 DIAGNOSIS — I160 Hypertensive urgency: Secondary | ICD-10-CM | POA: Diagnosis not present

## 2022-02-10 DIAGNOSIS — M1612 Unilateral primary osteoarthritis, left hip: Secondary | ICD-10-CM | POA: Diagnosis not present

## 2022-02-10 DIAGNOSIS — W19XXXA Unspecified fall, initial encounter: Secondary | ICD-10-CM | POA: Diagnosis not present

## 2022-02-10 DIAGNOSIS — F0390 Unspecified dementia without behavioral disturbance: Secondary | ICD-10-CM | POA: Diagnosis not present

## 2022-02-10 DIAGNOSIS — E559 Vitamin D deficiency, unspecified: Secondary | ICD-10-CM

## 2022-02-10 DIAGNOSIS — Z794 Long term (current) use of insulin: Secondary | ICD-10-CM | POA: Diagnosis not present

## 2022-02-10 DIAGNOSIS — R410 Disorientation, unspecified: Secondary | ICD-10-CM | POA: Diagnosis not present

## 2022-02-10 DIAGNOSIS — M25551 Pain in right hip: Secondary | ICD-10-CM | POA: Diagnosis not present

## 2022-02-10 DIAGNOSIS — I10 Essential (primary) hypertension: Secondary | ICD-10-CM

## 2022-02-10 DIAGNOSIS — M47816 Spondylosis without myelopathy or radiculopathy, lumbar region: Secondary | ICD-10-CM | POA: Diagnosis not present

## 2022-02-10 DIAGNOSIS — S42202A Unspecified fracture of upper end of left humerus, initial encounter for closed fracture: Secondary | ICD-10-CM | POA: Diagnosis not present

## 2022-02-10 DIAGNOSIS — M79603 Pain in arm, unspecified: Secondary | ICD-10-CM | POA: Diagnosis not present

## 2022-02-10 MED ORDER — DICLOFENAC SODIUM 1 % EX GEL: 2.0000 g | Freq: Four times a day (QID) | CUTANEOUS | 2 refills | Status: DC

## 2022-02-10 MED ORDER — LANTUS SOLOSTAR 100 UNIT/ML ~~LOC~~ SOPN: PEN_INJECTOR | SUBCUTANEOUS | 1 refills | Status: DC

## 2022-02-10 MED ORDER — CIPROFLOXACIN HCL 250 MG PO TABS: 250.0000 mg | ORAL_TABLET | Freq: Two times a day (BID) | ORAL | 0 refills | Status: AC

## 2022-02-10 MED ORDER — POTASSIUM CHLORIDE CRYS ER 20 MEQ PO TBCR: 20.0000 meq | EXTENDED_RELEASE_TABLET | Freq: Every day | ORAL | 1 refills | Status: DC

## 2022-02-10 NOTE — Progress Notes (Signed)
? ?Subjective:  ?Patient ID: Nancy Lang, female    DOB: 07-May-1946  Age: 76 y.o. MRN: [id number] ? ?Chief Complaint  ?Patient presents with  ? Hip Pain  ? ? ?HPI ? Pt in today for follow up of hip pain.  She was seen again at ED for hip pain - her xray was not redone.  She has referral into [facility] and appt is next week.  Appears she has osteopenia in hip and possibly hardware is loosening.  Family states that the tramadol she was given did not really seem to help and wanted to try voltaren gel again that she had used in the past ? ?Pt with hyponatremia at the hospital - needs refill of potassium that was given and due for repeat labwork ? ?Pt also still has elevated wbc - she is having urinary symptoms again as well - will repeat labwork and ua ? ?Pt complains of malaise and fatigue  -has felt that way for the past few weeks - it was noted at ED visit on 4/8 that her potassium was low - she was given supplements to take daily - Kcl 40mq qd - due to recheck labwork ?Current Outpatient Medications on File Prior to Visit  ?Medication Sig Dispense Refill  ? Accu-Chek FastClix Lancets MISC Just Lancets Unknown size    ? ascorbic acid (VITAMIN C) 500 MG tablet Take 500 mg by mouth daily.    ? aspirin 81 MG chewable tablet Chew 81 mg by mouth daily.     ? atorvastatin (LIPITOR) 80 MG tablet TAKE 1 TABLET AT BEDTIME 90 tablet 0  ? Calcium-Magnesium-Zinc (CAL-MAG-ZINC PO) Take 1 tablet by mouth daily with breakfast. Unknown strength per patient    ? Cholecalciferol (VITAMIN D3) 1000 units CAPS Take 1,000 Units by mouth daily.     ? dicyclomine (BENTYL) 10 MG capsule Take 1 capsule (10 mg total) by mouth 3 (three) times daily before meals. 270 capsule 1  ? donepezil (ARICEPT) 10 MG tablet TAKE 1 TABLET(10 MG) BY MOUTH AT BEDTIME 90 tablet 1  ? DROPLET PEN NEEDLES 32G X 4 MM MISC USE AS DIRECTED ONE TIME DAILY 100 each 3  ? glucose blood (ACCU-CHEK GUIDE) test strip CHECK BLOOD GLUCOSE THREE TIMES DAILY 300 strip  1  ? isosorbide mononitrate (IMDUR) 30 MG 24 hr tablet TAKE 1 TABLET EVERY DAY 30 tablet 0  ? levothyroxine (SYNTHROID) 50 MCG tablet TAKE 1 TABLET EVERY DAY 90 tablet 1  ? lisinopril (ZESTRIL) 10 MG tablet TAKE 1 TABLET EVERY DAY 90 tablet 0  ? memantine (NAMENDA) 10 MG tablet TAKE 1 TABLET TWICE DAILY 180 tablet 0  ? metoprolol tartrate (LOPRESSOR) 100 MG tablet Take 1 tablet (100 mg total) by mouth 2 (two) times daily. 180 tablet 0  ? montelukast (SINGULAIR) 10 MG tablet TAKE 1 TABLET EVERY DAY 90 tablet 3  ? Multiple Vitamins-Minerals (ONE-A-DAY PROACTIVE 65+) TABS Take 1 tablet by mouth daily with breakfast. Unknown strength per patient    ? Omega-3 1000 MG CAPS Take 1,000 mg by mouth daily.     ? pantoprazole (PROTONIX) 40 MG tablet TAKE 1 TABLET EVERY DAY 90 tablet 1  ? ?No current facility-administered medications on file prior to visit.  ? ?Past Medical History:  ?Diagnosis Date  ? Acquired hypothyroidism 12/31/2015  ? Acute cystitis without hematuria 06/12/2020  ? Acute laryngopharyngitis 05/23/2020  ? Angina pectoris ([place]   ? Atypical nevus 06/12/2020  ? 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  Left M2, stenosis left P2  ? 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 Coronary artery disease involving native coronary artery of native heart 10/07/2016  ? Overview:  PTCA and drug-eluting stents to mid proximal portion to LAD and mid and proximal portion of circumflex in 2017 Drug-eluting stent mid LAD in May 2018  ? Dementia without behavioral disturbance ([place]) 03/10/2017  ? Diabetes mellitus without complication ([place])   ? Dyslipidemia (high LDL; low HDL) 10/07/2016  ? Essential hypertension, benign 10/07/2016  ? GERD (gastroesophageal reflux disease)   ? Hyperlipidemia   ? Hypertension   ? Insulin dependent type 2 diabetes mellitus, controlled ([place]) 10/07/2016  ? Malaise 01/20/2021  ? Memory impairment 03/10/2017  ? Mixed hyperlipidemia 12/06/2019  ? NSTEMI (non-ST elevated  myocardial infarction) ([place]) 09/07/2016  ? Thyroid disease   ? Vitamin D deficiency 03/08/2017  ? ?Past Surgical History:  ?Procedure Laterality Date  ? CARDIAC CATHETERIZATION    ?  ?Family History  ?Problem Relation Age of Onset  ? Diabetes Mother   ? Cancer Mother   ? Heart disease Mother   ? Heart disease Father   ? Alcohol abuse Father   ? ?Social History  ? ?Socioeconomic History  ? Marital status: Widowed  ?  Spouse name: Not on file  ? Number of children: Not on file  ? Years of education: Not on file  ? Highest education level: Not on file  ?Occupational History  ? Occupation: retired  ?Tobacco Use  ? Smoking status: Never  ? Smokeless tobacco: Current  ?  Types: Snuff  ? Tobacco comments:  ?  one can a week  ?Vaping Use  ? Vaping Use: Never used  ?Substance and Sexual Activity  ? Alcohol use: No  ? Drug use: No  ? Sexual activity: Not Currently  ?Other Topics Concern  ? Not on file  ?Social History Narrative  ? Not on file  ? ?Social Determinants of Health  ? ?Financial Resource Strain: Not on file  ?Food Insecurity: Not on file  ?Transportation Needs: Not on file  ?Physical Activity: Not on file  ?Stress: Not on file  ?Social Connections: Not on file  ? ? ?Review of Systems ?CONSTITUTIONAL: see HPI ?E/N/T: Negative for ear pain, nasal congestion and sore throat.  ?CARDIOVASCULAR: Negative for chest pain, dizziness, palpitations and pedal edema.  ?RESPIRATORY: Negative for recent cough and dyspnea.  ?GASTROINTESTINAL: Negative for abdominal pain, acid reflux symptoms, constipation, diarrhea, nausea and vomiting.  ?GU - urine frequency and urgency ?MSK: Negative for arthralgias and myalgias.  ?INTEGUMENTARY: Negative for rash.  ?NEUROLOGICAL: Negative for dizziness and headaches.  ?PSYCHIATRIC: Negative for sleep disturbance and to question depression screen.  Negative for depression, negative for anhedonia.  ?   ? ? ?Objective:  ?BP 138/70   Pulse 72   Temp 98.3 ?F (36.8 ?C)   Ht 4' 11.5" (1.511 m)   Wt  97 lb (44 kg)   SpO2 97%   BMI 19.26 kg/m?  ? ? ?  02/10/2022  ?  2:19 PM 01/27/2022  ?  4:01 PM 10/31/2021  ? 10:21 AM  ?BP/Weight  ?Systolic BP 893 810 175  ?Diastolic BP 70 68 72  ?Wt. (Lbs) 97 107.2 111.2  ?BMI 19.26 kg/m2 21.29 kg/m2 22.08 kg/m2  ? ? ?Physical Exam ?PHYSICAL EXAM:  ? ?VS: BP 138/70   Pulse 72   Temp 98.3 ?F (36.8 ?C)   Ht 4' 11.5" (1.511 m)  Wt 97 lb (44 kg)   SpO2 97%   BMI 19.26 kg/m?  ? ?GEN: Well nourished, well developed, in no acute distress  ? ?Cardiac: RRR; no murmurs, rubs, or gallops,no edema -  ?Respiratory:  normal respiratory rate and pattern with no distress - normal breath sounds with no rales, rhonchi, wheezes or rubs ?GI: normal bowel sounds, no masses or tenderness ? ?Psych: euthymic mood, appropriate affect and demeanor ? ? ?PT NOT ABLE TO GET UA ?Diabetic Foot Exam - Simple   ?No data filed ?  ?  ? ?Lab Results  ?Component Value Date  ? WBC 14.5 (H) 02/02/2022  ? HGB 15.0 02/02/2022  ? HCT 44.3 02/02/2022  ? PLT 476 (H) 02/02/2022  ? GLUCOSE 432 (H) 01/27/2022  ? CHOL 115 10/31/2021  ? TRIG 127 10/31/2021  ? HDL 42 10/31/2021  ? [place] 50 10/31/2021  ? ALT 30 01/27/2022  ? AST 14 01/27/2022  ? NA 141 01/27/2022  ? K 3.9 01/27/2022  ? CL 100 01/27/2022  ? CREATININE 1.20 (H) 01/27/2022  ? BUN 34 (H) 01/27/2022  ? CO2 24 01/27/2022  ? TSH 2.990 10/31/2021  ? HGBA1C 6.6 (H) 10/31/2021  ? MICROALBUR 150 06/12/2020  ? ? ? ? ?Assessment & Plan:  ? ?Problem List Items Addressed This Visit   ? ?  ? Cardiovascular and Mediastinum  ? Essential hypertension, benign  ? Relevant Orders  ? CBC with Differential/Platelet  ? Comprehensive metabolic panel ?Continue meds  ?  ? Endocrine  ? Acquired hypothyroidism  ? Relevant Orders  ? TSH  ? Insulin dependent type 2 diabetes mellitus, controlled ([place]) - Primary  ? Relevant Orders  ? CBC with Differential/Platelet  ? Comprehensive metabolic panel  ?  ? Other  ? Vitamin D deficiency  ? Relevant Orders  ? VITAMIN D 25 Hydroxy (Vit-D  Deficiency, Fractures) ? ?Hypokalemia ?Continue meds ?Cmp pending ? ?Hip pain ?Rx for diclofenac ?Follow up with ortho as directed  ?. ? ?Meds ordered this encounter  ?Medications  ? insulin glargine (LANTUS SOLOSTAR) 100 UNIT/

## 2022-02-11 ENCOUNTER — Encounter: Payer: Self-pay | Admitting: Physician Assistant

## 2022-02-11 ENCOUNTER — Other Ambulatory Visit: Payer: Self-pay | Admitting: Family Medicine

## 2022-02-11 DIAGNOSIS — M25551 Pain in right hip: Secondary | ICD-10-CM | POA: Diagnosis not present

## 2022-02-11 DIAGNOSIS — S42212A Unspecified displaced fracture of surgical neck of left humerus, initial encounter for closed fracture: Secondary | ICD-10-CM | POA: Diagnosis not present

## 2022-02-11 DIAGNOSIS — E876 Hypokalemia: Secondary | ICD-10-CM | POA: Diagnosis not present

## 2022-02-11 DIAGNOSIS — S42292A Other displaced fracture of upper end of left humerus, initial encounter for closed fracture: Secondary | ICD-10-CM | POA: Diagnosis not present

## 2022-02-11 DIAGNOSIS — S42202A Unspecified fracture of upper end of left humerus, initial encounter for closed fracture: Secondary | ICD-10-CM | POA: Diagnosis not present

## 2022-02-11 DIAGNOSIS — I10 Essential (primary) hypertension: Secondary | ICD-10-CM | POA: Diagnosis not present

## 2022-02-11 LAB — CBC WITH DIFFERENTIAL/PLATELET
Basophils Absolute: 0.1 10*3/uL (ref 0.0–0.2)
Basos: 0 %
EOS (ABSOLUTE): 0 10*3/uL (ref 0.0–0.4)
Eos: 0 %
Hematocrit: 36.4 % (ref 34.0–46.6)
Hemoglobin: 12.1 g/dL (ref 11.1–15.9)
Immature Grans (Abs): 0 10*3/uL (ref 0.0–0.1)
Immature Granulocytes: 0 %
Lymphocytes Absolute: 1.2 10*3/uL (ref 0.7–3.1)
Lymphs: 10 %
MCH: 31 pg (ref 26.6–33.0)
MCHC: 33.2 g/dL (ref 31.5–35.7)
MCV: 93 fL (ref 79–97)
Monocytes Absolute: 0.9 10*3/uL (ref 0.1–0.9)
Monocytes: 7 %
Neutrophils Absolute: 9.7 10*3/uL — ABNORMAL HIGH (ref 1.4–7.0)
Neutrophils: 83 %
Platelets: 362 10*3/uL (ref 150–450)
RBC: 3.9 x10E6/uL (ref 3.77–5.28)
RDW: 13.9 % (ref 11.7–15.4)
WBC: 11.9 10*3/uL — ABNORMAL HIGH (ref 3.4–10.8)

## 2022-02-11 LAB — COMPREHENSIVE METABOLIC PANEL
ALT: 34 IU/L — ABNORMAL HIGH (ref 0–32)
AST: 27 IU/L (ref 0–40)
Albumin/Globulin Ratio: 2.4 — ABNORMAL HIGH (ref 1.2–2.2)
Albumin: 4.1 g/dL (ref 3.7–4.7)
Alkaline Phosphatase: 94 IU/L (ref 44–121)
BUN/Creatinine Ratio: 19 (ref 12–28)
BUN: 17 mg/dL (ref 8–27)
Bilirubin Total: 1 mg/dL (ref 0.0–1.2)
CO2: 26 mmol/L (ref 20–29)
Calcium: 9.6 mg/dL (ref 8.7–10.3)
Chloride: 101 mmol/L (ref 96–106)
Creatinine, Ser: 0.89 mg/dL (ref 0.57–1.00)
Globulin, Total: 1.7 g/dL (ref 1.5–4.5)
Glucose: 230 mg/dL — ABNORMAL HIGH (ref 70–99)
Potassium: 4.2 mmol/L (ref 3.5–5.2)
Sodium: 140 mmol/L (ref 134–144)
Total Protein: 5.8 g/dL — ABNORMAL LOW (ref 6.0–8.5)
eGFR: 67 mL/min/{1.73_m2} (ref >59)

## 2022-02-11 LAB — VITAMIN D 25 HYDROXY (VIT D DEFICIENCY, FRACTURES): Vit D, 25-Hydroxy: 48.8 ng/mL (ref 30.0–100.0)

## 2022-02-11 LAB — TSH: TSH: 2.71 u[IU]/mL (ref 0.450–4.500)

## 2022-02-13 ENCOUNTER — Ambulatory Visit: Payer: Medicare HMO | Admitting: Cardiology

## 2022-02-18 DIAGNOSIS — S42202A Unspecified fracture of upper end of left humerus, initial encounter for closed fracture: Secondary | ICD-10-CM | POA: Diagnosis not present

## 2022-02-18 DIAGNOSIS — S92341A Displaced fracture of fourth metatarsal bone, right foot, initial encounter for closed fracture: Secondary | ICD-10-CM | POA: Diagnosis not present

## 2022-02-18 DIAGNOSIS — S92331A Displaced fracture of third metatarsal bone, right foot, initial encounter for closed fracture: Secondary | ICD-10-CM | POA: Diagnosis not present

## 2022-02-24 ENCOUNTER — Telehealth: Payer: Self-pay

## 2022-02-24 NOTE — Telephone Encounter (Signed)
Called patient o schedule AWV appointment, phone number is not longer in service. ?

## 2022-03-02 ENCOUNTER — Encounter: Payer: Self-pay | Admitting: Physician Assistant

## 2022-03-02 ENCOUNTER — Ambulatory Visit (INDEPENDENT_AMBULATORY_CARE_PROVIDER_SITE_OTHER): Payer: Medicare HMO | Admitting: Physician Assistant

## 2022-03-02 VITALS — BP 128/62 | HR 60 | Temp 98.0°F | Ht 59.5 in

## 2022-03-02 DIAGNOSIS — R413 Other amnesia: Secondary | ICD-10-CM | POA: Diagnosis not present

## 2022-03-02 DIAGNOSIS — Z8781 Personal history of (healed) traumatic fracture: Secondary | ICD-10-CM

## 2022-03-02 DIAGNOSIS — R3589 Other polyuria: Secondary | ICD-10-CM | POA: Diagnosis not present

## 2022-03-02 DIAGNOSIS — I10 Essential (primary) hypertension: Secondary | ICD-10-CM | POA: Diagnosis not present

## 2022-03-02 DIAGNOSIS — Z794 Long term (current) use of insulin: Secondary | ICD-10-CM | POA: Diagnosis not present

## 2022-03-02 DIAGNOSIS — E782 Mixed hyperlipidemia: Secondary | ICD-10-CM | POA: Diagnosis not present

## 2022-03-02 DIAGNOSIS — L608 Other nail disorders: Secondary | ICD-10-CM | POA: Diagnosis not present

## 2022-03-02 DIAGNOSIS — E119 Type 2 diabetes mellitus without complications: Secondary | ICD-10-CM

## 2022-03-02 DIAGNOSIS — E039 Hypothyroidism, unspecified: Secondary | ICD-10-CM

## 2022-03-02 DIAGNOSIS — E559 Vitamin D deficiency, unspecified: Secondary | ICD-10-CM

## 2022-03-02 DIAGNOSIS — D649 Anemia, unspecified: Secondary | ICD-10-CM | POA: Diagnosis not present

## 2022-03-02 LAB — POCT URINALYSIS DIP (CLINITEK)
Bilirubin, UA: NEGATIVE
Blood, UA: NEGATIVE
Glucose, UA: NEGATIVE mg/dL
Ketones, POC UA: NEGATIVE mg/dL
Leukocytes, UA: NEGATIVE
Nitrite, UA: NEGATIVE
Spec Grav, UA: 1.015 (ref 1.010–1.025)
Urobilinogen, UA: 0.2 E.U./dL
pH, UA: 6 (ref 5.0–8.0)

## 2022-03-02 MED ORDER — LOPERAMIDE HCL 2 MG PO TABS: 2.0000 mg | ORAL_TABLET | Freq: Two times a day (BID) | ORAL | 5 refills | Status: DC | PRN

## 2022-03-02 MED ORDER — LANTUS SOLOSTAR 100 UNIT/ML ~~LOC~~ SOPN: PEN_INJECTOR | SUBCUTANEOUS | 1 refills | Status: DC

## 2022-03-02 NOTE — Assessment & Plan Note (Signed)
Continue meds as directed

## 2022-03-02 NOTE — Assessment & Plan Note (Signed)
Continue current meds 

## 2022-03-02 NOTE — Progress Notes (Signed)
? ?Established Patient Office Visit ? ?Subjective:  ?Patient ID: Nancy Lang, female    DOB: 1946-03-14  Age: 76 y.o. MRN: [id number] ? ?CC:  ?Chief Complaint  ?Patient presents with  ? Hyperlipidemia  ? Hypertension  ? Hypothyroidism  ? Gastroesophageal Reflux  ? ? ?HPI ?Nancy Lang presents for chronic follow up  ? ?Mixed hyperlipidemia  ?Pt presents with hyperlipidemia. . Compliance with treatment has been good The patient is compliant with medications, maintains a low cholesterol diet , follows up as directed ,  The patient denies experiencing any hypercholesterolemia related symptoms.  ?She is currently taking lipitor '80mg'$  qd ? ?Pt presents for follow up of hypertension.  The patient is tolerating the medication well without side effects. Compliance with treatment has been good; including taking medication as directed , maintains a healthy diet and regular exercise regimen , and following up as directed. She is  currently taking zestril '10mg'$  and lopressor '100mg'$  ?She also follows with cardiology and placed on imdur '30mg'$  ? ?Pt with history of CAD - currently on beta blocker and Imdur -- she is overdue for cardiology appt and daughter will be making appt ? ?Dementia: She is here for follow upt of cognitive problems. She is accompanied by  daughter.  Primary caregiver is her. She states that her symptoms are stable at this time ?Functional Assessment:  ?Activities of Daily Living (ADLs):   ?She is independent in the following: feeding ?Requires assistance with the following: bathing and hygiene, toileting and dressing ?Currently on Namenda '10mg'$  qd and aricept '10mg'$  qd --- is currently following with neurology ? ?Hypothyroidism: Patient presents for evaluation of thyroid function. She has had some recent fatigue - she is currently on levothyroxine 11mg qd ? ?Pt for follow up for diabetes -daughter states that over the past few weeks she has changed her diet for pt drastically and it has made significant  improvements in her glucose - states in mornings fasting ranges are 70s-80s and highest readings random not above 215 (before had been in 300s and above) she is currently taking Lantus '30mg'$  at noon ? ?Pt with history of GERD and IBS - daughter requests refill of Immodium that Dr BMelina Copahad prescribed for patient --- pt also takes bentyl and protonix ? ?Pt with history of low vitamin D - due for labwork ? ?Pt currently following with Dr YSamule Dryfor left humerus fracture (in a sling) and right foot toe fractures (in a flat shoe) - she has follow up appt tomorrow with his office ?Pt states she is doing well ? ?Past Medical History:  ?Diagnosis Date  ? Acquired hypothyroidism 12/31/2015  ? Acute cystitis without hematuria 06/12/2020  ? Acute laryngopharyngitis 05/23/2020  ? Angina pectoris ([place]   ? Atypical nevus 06/12/2020  ? 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 Cerebral hemorrhage ([place] 05/05/2018  ? 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 Coronary artery disease involving native coronary artery of native heart 10/07/2016  ? Overview:  PTCA and drug-eluting stents to mid proximal portion to LAD and mid and proximal portion of circumflex in 2017 Drug-eluting stent mid LAD in May 2018  ? Dementia without behavioral disturbance ([place] 03/10/2017  ? Diabetes mellitus without complication ([place]   ? Dyslipidemia (high LDL; low HDL) 10/07/2016  ? Essential hypertension, benign 10/07/2016  ? GERD (gastroesophageal reflux disease)   ? Hyperlipidemia   ? Hypertension   ? Insulin dependent type 2 diabetes mellitus, controlled ([place]  10/07/2016  ? Malaise 01/20/2021  ? Memory impairment 03/10/2017  ? Mixed hyperlipidemia 12/06/2019  ? NSTEMI (non-ST elevated myocardial infarction) ([place]) 09/07/2016  ? Thyroid disease   ? Vitamin D deficiency 03/08/2017  ? ? ?Past Surgical History:  ?Procedure Laterality Date  ? CARDIAC CATHETERIZATION    ? ? ?Family History  ?Problem Relation Age of Onset  ? Diabetes Mother   ?  Cancer Mother   ? Heart disease Mother   ? Heart disease Father   ? Alcohol abuse Father   ? ? ?Social History  ? ?Socioeconomic History  ? Marital status: Widowed  ?  Spouse name: Not on file  ? Number of children: Not on file  ? Years of education: Not on file  ? Highest education level: Not on file  ?Occupational History  ? Occupation: retired  ?Tobacco Use  ? Smoking status: Never  ? Smokeless tobacco: Current  ?  Types: Snuff  ? Tobacco comments:  ?  one can a week  ?Vaping Use  ? Vaping Use: Never used  ?Substance and Sexual Activity  ? Alcohol use: No  ? Drug use: No  ? Sexual activity: Not Currently  ?Other Topics Concern  ? Not on file  ?Social History Narrative  ? Not on file  ? ?Social Determinants of Health  ? ?Financial Resource Strain: Not on file  ?Food Insecurity: Not on file  ?Transportation Needs: Not on file  ?Physical Activity: Not on file  ?Stress: Not on file  ?Social Connections: Not on file  ?Intimate Partner Violence: Not on file  ? ? ? ?Current Outpatient Medications:  ?  Accu-Chek FastClix Lancets MISC, Just Lancets Unknown size, Disp: , Rfl:  ?  ascorbic acid (VITAMIN C) 500 MG tablet, Take 500 mg by mouth daily., Disp: , Rfl:  ?  aspirin 81 MG chewable tablet, Chew 81 mg by mouth daily. , Disp: , Rfl:  ?  atorvastatin (LIPITOR) 80 MG tablet, TAKE 1 TABLET AT BEDTIME, Disp: 90 tablet, Rfl: 0 ?  Calcium-Magnesium-Zinc (CAL-MAG-ZINC PO), Take 1 tablet by mouth daily with breakfast. Unknown strength per patient, Disp: , Rfl:  ?  Cholecalciferol (VITAMIN D3) 1000 units CAPS, Take 1,000 Units by mouth daily. , Disp: , Rfl:  ?  diclofenac Sodium (VOLTAREN) 1 % GEL, Apply 2 g topically 4 (four) times daily., Disp: 100 g, Rfl: 2 ?  dicyclomine (BENTYL) 10 MG capsule, Take 1 capsule (10 mg total) by mouth 3 (three) times daily before meals., Disp: 270 capsule, Rfl: 1 ?  donepezil (ARICEPT) 10 MG tablet, TAKE 1 TABLET(10 MG) BY MOUTH AT BEDTIME, Disp: 90 tablet, Rfl: 1 ?  DROPLET PEN NEEDLES 32G X  4 MM MISC, USE AS DIRECTED ONE TIME DAILY, Disp: 100 each, Rfl: 3 ?  glucose blood (ACCU-CHEK GUIDE) test strip, CHECK BLOOD GLUCOSE THREE TIMES DAILY, Disp: 300 strip, Rfl: 1 ?  isosorbide mononitrate (IMDUR) 30 MG 24 hr tablet, TAKE 1 TABLET EVERY DAY, Disp: 30 tablet, Rfl: 0 ?  levothyroxine (SYNTHROID) 50 MCG tablet, TAKE 1 TABLET EVERY DAY, Disp: 90 tablet, Rfl: 1 ?  lisinopril (ZESTRIL) 10 MG tablet, TAKE 1 TABLET EVERY DAY, Disp: 90 tablet, Rfl: 0 ?  loperamide (IMODIUM A-D) 2 MG tablet, Take 1 tablet (2 mg total) by mouth 2 (two) times daily as needed for diarrhea or loose stools., Disp: 60 tablet, Rfl: 5 ?  memantine (NAMENDA) 10 MG tablet, TAKE 1 TABLET TWICE DAILY, Disp: 180 tablet, Rfl: 0 ?  metoprolol tartrate (LOPRESSOR) 100 MG tablet, Take 1 tablet (100 mg total) by mouth 2 (two) times daily., Disp: 180 tablet, Rfl: 0 ?  montelukast (SINGULAIR) 10 MG tablet, TAKE 1 TABLET EVERY DAY, Disp: 90 tablet, Rfl: 3 ?  Multiple Vitamins-Minerals (ONE-A-DAY PROACTIVE 65+) TABS, Take 1 tablet by mouth daily with breakfast. Unknown strength per patient, Disp: , Rfl:  ?  Omega-3 1000 MG CAPS, Take 1,000 mg by mouth daily. , Disp: , Rfl:  ?  pantoprazole (PROTONIX) 40 MG tablet, TAKE 1 TABLET EVERY DAY, Disp: 90 tablet, Rfl: 1 ?  potassium chloride SA (KLOR-CON M) 20 MEQ tablet, Take 1 tablet (20 mEq total) by mouth daily., Disp: 30 tablet, Rfl: 1 ?  insulin glargine (LANTUS SOLOSTAR) 100 UNIT/ML Solostar Pen, 30 units am, Disp: 30 mL, Rfl: 1 ? ? ?Allergies  ?Allergen Reactions  ? Pravastatin   ?  ANGER AND MEMORY ISSUES  ? Ativan [Lorazepam] Anxiety and Other (See Comments)  ?  Made the patient "paranoid and mean" (per family)- also worsened anxiety  ? Fenofibrate Rash  ? Oxytetracycline Rash  ? Penicillins Rash  ? Septra [Sulfamethoxazole-Trimethoprim] Rash  ? Sulfa Antibiotics Rash  ? Tetracyclines & Related Rash  ? ? ?CONSTITUTIONAL: Negative for chills, fatigue, fever, unintentional weight gain and  unintentional weight loss.  ?E/N/T: Negative for ear pain, nasal congestion and sore throat.  ?CARDIOVASCULAR: Negative for chest pain, dizziness, palpitations and pedal edema.  ?RESPIRATORY: Negative for recent cou

## 2022-03-02 NOTE — Assessment & Plan Note (Signed)
Continue follow up with Dr Samule Dry ?

## 2022-03-02 NOTE — Addendum Note (Signed)
Addended byMarge Duncans on: 03/02/2022 09:54 AM ? ? Modules accepted: Orders ? ?

## 2022-03-02 NOTE — Assessment & Plan Note (Signed)
labwork pending 

## 2022-03-02 NOTE — Assessment & Plan Note (Signed)
Continue meds as directed and watch diet ? ?

## 2022-03-02 NOTE — Assessment & Plan Note (Signed)
Continue meds as directed ?Watch diet ?

## 2022-03-02 NOTE — Assessment & Plan Note (Signed)
Continue meds  ?labwork pending ?

## 2022-03-02 NOTE — Assessment & Plan Note (Signed)
ua normal ?

## 2022-03-02 NOTE — Assessment & Plan Note (Signed)
Refer to podiatry

## 2022-03-03 ENCOUNTER — Other Ambulatory Visit: Payer: Self-pay | Admitting: Physician Assistant

## 2022-03-03 ENCOUNTER — Other Ambulatory Visit: Payer: Self-pay

## 2022-03-03 DIAGNOSIS — S92341A Displaced fracture of fourth metatarsal bone, right foot, initial encounter for closed fracture: Secondary | ICD-10-CM | POA: Diagnosis not present

## 2022-03-03 DIAGNOSIS — S92331A Displaced fracture of third metatarsal bone, right foot, initial encounter for closed fracture: Secondary | ICD-10-CM | POA: Diagnosis not present

## 2022-03-03 DIAGNOSIS — R899 Unspecified abnormal finding in specimens from other organs, systems and tissues: Secondary | ICD-10-CM

## 2022-03-03 DIAGNOSIS — S42202A Unspecified fracture of upper end of left humerus, initial encounter for closed fracture: Secondary | ICD-10-CM | POA: Diagnosis not present

## 2022-03-03 LAB — CBC WITH DIFFERENTIAL/PLATELET
Basophils Absolute: 0.1 10*3/uL (ref 0.0–0.2)
Basos: 1 %
EOS (ABSOLUTE): 0.2 10*3/uL (ref 0.0–0.4)
Eos: 2 %
Hematocrit: 32.7 % — ABNORMAL LOW (ref 34.0–46.6)
Hemoglobin: 10.7 g/dL — ABNORMAL LOW (ref 11.1–15.9)
Immature Grans (Abs): 0.1 10*3/uL (ref 0.0–0.1)
Immature Granulocytes: 1 %
Lymphocytes Absolute: 1.7 10*3/uL (ref 0.7–3.1)
Lymphs: 16 %
MCH: 31.1 pg (ref 26.6–33.0)
MCHC: 32.7 g/dL (ref 31.5–35.7)
MCV: 95 fL (ref 79–97)
Monocytes Absolute: 1 10*3/uL — ABNORMAL HIGH (ref 0.1–0.9)
Monocytes: 9 %
Neutrophils Absolute: 7.5 10*3/uL — ABNORMAL HIGH (ref 1.4–7.0)
Neutrophils: 71 %
Platelets: 508 10*3/uL — ABNORMAL HIGH (ref 150–450)
RBC: 3.44 x10E6/uL — ABNORMAL LOW (ref 3.77–5.28)
RDW: 16.6 % — ABNORMAL HIGH (ref 11.7–15.4)
WBC: 10.5 10*3/uL (ref 3.4–10.8)

## 2022-03-03 LAB — COMPREHENSIVE METABOLIC PANEL
ALT: 30 IU/L (ref 0–32)
AST: 26 IU/L (ref 0–40)
Albumin/Globulin Ratio: 1.9 (ref 1.2–2.2)
Albumin: 4 g/dL (ref 3.7–4.7)
Alkaline Phosphatase: 148 IU/L — ABNORMAL HIGH (ref 44–121)
BUN/Creatinine Ratio: 23 (ref 12–28)
BUN: 21 mg/dL (ref 8–27)
Bilirubin Total: 1.1 mg/dL (ref 0.0–1.2)
CO2: 23 mmol/L (ref 20–29)
Calcium: 9.8 mg/dL (ref 8.7–10.3)
Chloride: 101 mmol/L (ref 96–106)
Creatinine, Ser: 0.93 mg/dL (ref 0.57–1.00)
Globulin, Total: 2.1 g/dL (ref 1.5–4.5)
Glucose: 195 mg/dL — ABNORMAL HIGH (ref 70–99)
Potassium: 4 mmol/L (ref 3.5–5.2)
Sodium: 139 mmol/L (ref 134–144)
Total Protein: 6.1 g/dL (ref 6.0–8.5)
eGFR: 63 mL/min/{1.73_m2} (ref >59)

## 2022-03-03 LAB — CARDIOVASCULAR RISK ASSESSMENT

## 2022-03-03 LAB — HEMOGLOBIN A1C
Est. average glucose Bld gHb Est-mCnc: 146 mg/dL
Hgb A1c MFr Bld: 6.7 % — ABNORMAL HIGH (ref 4.8–5.6)

## 2022-03-03 LAB — VITAMIN D 25 HYDROXY (VIT D DEFICIENCY, FRACTURES): Vit D, 25-Hydroxy: 69.7 ng/mL (ref 30.0–100.0)

## 2022-03-03 LAB — MICROALBUMIN / CREATININE URINE RATIO
Creatinine, Urine: 56.5 mg/dL
Microalb/Creat Ratio: 972 mg/g creat — ABNORMAL HIGH (ref 0–29)
Microalbumin, Urine: 549.1 ug/mL

## 2022-03-03 LAB — LIPID PANEL
Chol/HDL Ratio: 2.9 ratio (ref 0.0–4.4)
Cholesterol, Total: 117 mg/dL (ref 100–199)
HDL: 40 mg/dL (ref >39)
LDL Chol Calc (NIH): 45 mg/dL (ref 0–99)
Triglycerides: 195 mg/dL — ABNORMAL HIGH (ref 0–149)
VLDL Cholesterol Cal: 32 mg/dL (ref 5–40)

## 2022-03-03 LAB — TSH: TSH: 3.69 u[IU]/mL (ref 0.450–4.500)

## 2022-03-09 ENCOUNTER — Encounter: Payer: Self-pay | Admitting: Cardiology

## 2022-03-09 ENCOUNTER — Ambulatory Visit: Payer: Medicare HMO | Admitting: Cardiology

## 2022-03-09 VITALS — BP 130/60 | HR 62 | Ht 60.0 in | Wt 97.0 lb

## 2022-03-09 DIAGNOSIS — I2510 Atherosclerotic heart disease of native coronary artery without angina pectoris: Secondary | ICD-10-CM | POA: Diagnosis not present

## 2022-03-09 DIAGNOSIS — Z794 Long term (current) use of insulin: Secondary | ICD-10-CM | POA: Diagnosis not present

## 2022-03-09 DIAGNOSIS — E119 Type 2 diabetes mellitus without complications: Secondary | ICD-10-CM

## 2022-03-09 DIAGNOSIS — E785 Hyperlipidemia, unspecified: Secondary | ICD-10-CM

## 2022-03-09 DIAGNOSIS — I10 Essential (primary) hypertension: Secondary | ICD-10-CM | POA: Diagnosis not present

## 2022-03-09 NOTE — Patient Instructions (Signed)

## 2022-03-09 NOTE — Progress Notes (Signed)
?Cardiology Office Note:   ? ?Date:  03/09/2022  ? ?ID:  Nancy Lang, [place] 06-Nov-1945, MRN [id number] ? ?PCP:  Marge Duncans, PA-C  ?Cardiologist:  Jenne Campus, MD   ? ?Referring MD: Marge Duncans, PA-C  ? ?Chief Complaint  ?Patient presents with  ? RH follow up  ? ? ?History of Present Illness:   ? ?Nancy Lang is a 76 y.o. female with past medical history significant for coronary artery disease, she did have PTCA and stenting of the mid left anterior descending artery in May 2018 with drug-eluting stent, prior of that she did have stenting to the circumflex artery.  Also history of essential hypertension, diabetes, dyslipidemia, dementia.  She was referred to [place] because recently she ended up being in the hospital her blood pressure was highly fluctuating she had a lot of high blood pressure.  Also day after being discharged from the hospital she went back there because she lost her balance fell down and breaking her left arm. ?She comes today to my office with 2 family members.  She is demented she just tell me I am all right I am okay she is hard of hearing so really I cannot get any history from her family tells me that she is able to walk a little bit around but does not do much overall she did describe to have some pain in the right hip and actually some visit with orthopedic surgery is contemplated.  Note talks about potential surgery.  In terms of broken left also no surgical intervention is required. ? ?Past Medical History:  ?Diagnosis Date  ? Acquired hypothyroidism 12/31/2015  ? Acute cystitis without hematuria 06/12/2020  ? Acute laryngopharyngitis 05/23/2020  ? Angina pectoris ([place])   ? Atypical nevus 06/12/2020  ? 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 Coronary artery disease involving native coronary artery of native heart 10/07/2016  ? Overview:  PTCA and drug-eluting stents to mid  proximal portion to LAD and mid and proximal portion of circumflex in 2017 Drug-eluting stent mid LAD in May 2018  ? Dementia without behavioral disturbance ([place]) 03/10/2017  ? Diabetes mellitus without complication ([place])   ? Dyslipidemia (high LDL; low HDL) 10/07/2016  ? Essential hypertension, benign 10/07/2016  ? GERD (gastroesophageal reflux disease)   ? Hyperlipidemia   ? Hypertension   ? Insulin dependent type 2 diabetes mellitus, controlled ([place]) 10/07/2016  ? Malaise 01/20/2021  ? Memory impairment 03/10/2017  ? Mixed hyperlipidemia 12/06/2019  ? NSTEMI (non-ST elevated myocardial infarction) ([place]) 09/07/2016  ? Thyroid disease   ? Vitamin D deficiency 03/08/2017  ? ? ?Past Surgical History:  ?Procedure Laterality Date  ? CARDIAC CATHETERIZATION    ? ? ?Current Medications: ?Current Meds  ?Medication Sig  ? Accu-Chek FastClix Lancets MISC 1 each by Other route See admin instructions. Just Lancets Unknown size  ? ascorbic acid (VITAMIN C) 500 MG tablet Take 500 mg by mouth daily.  ? aspirin 81 MG chewable tablet Chew 81 mg by mouth daily.   ? atorvastatin (LIPITOR) 80 MG tablet TAKE 1 TABLET AT BEDTIME (Patient taking differently: Take 80 mg by mouth daily.)  ? Calcium-Magnesium-Zinc (CAL-MAG-ZINC PO) Take 1 tablet by mouth daily with breakfast. Unknown strength per patient  ? Cholecalciferol (VITAMIN D3) 1000 units CAPS Take 1,000 Units by mouth daily.   ? diclofenac Sodium (VOLTAREN) 1 % GEL Apply  2 g topically 4 (four) times daily.  ? dicyclomine (BENTYL) 10 MG capsule Take 1 capsule (10 mg total) by mouth 3 (three) times daily before meals.  ? donepezil (ARICEPT) 10 MG tablet TAKE 1 TABLET(10 MG) BY MOUTH AT BEDTIME  ? DROPLET PEN NEEDLES 32G X 4 MM MISC USE AS DIRECTED ONE TIME DAILY (Patient taking differently: 1 each by Other route as directed. Glucose check)  ? glucose blood (ACCU-CHEK GUIDE) test strip CHECK BLOOD GLUCOSE THREE TIMES DAILY (Patient taking differently: 1 each by Other route See admin  instructions. CHECK BLOOD GLUCOSE THREE TIMES DAILY)  ? insulin glargine (LANTUS SOLOSTAR) 100 UNIT/ML Solostar Pen 30 units am (Patient taking differently: Inject 30 Units into the skin daily. 30 units am)  ? isosorbide mononitrate (IMDUR) 30 MG 24 hr tablet TAKE 1 TABLET EVERY DAY (Patient taking differently: Take 30 mg by mouth daily.)  ? levothyroxine (SYNTHROID) 50 MCG tablet TAKE 1 TABLET EVERY DAY (Patient taking differently: Take 50 mcg by mouth daily before breakfast.)  ? lisinopril (ZESTRIL) 10 MG tablet TAKE 1 TABLET EVERY DAY (Patient taking differently: Take 10 mg by mouth daily. TAKE 1 TABLET EVERY DAY)  ? loperamide (IMODIUM A-D) 2 MG tablet Take 1 tablet (2 mg total) by mouth 2 (two) times daily as needed for diarrhea or loose stools.  ? metoprolol tartrate (LOPRESSOR) 100 MG tablet Take 1 tablet (100 mg total) by mouth 2 (two) times daily.  ? montelukast (SINGULAIR) 10 MG tablet TAKE 1 TABLET EVERY DAY (Patient taking differently: Take 10 mg by mouth at bedtime.)  ? Multiple Vitamins-Minerals (ONE-A-DAY PROACTIVE 65+) TABS Take 1 tablet by mouth daily with breakfast. Unknown strength per patient  ? Omega-3 1000 MG CAPS Take 1,000 mg by mouth daily.   ? oxycodone (OXY-IR) 5 MG capsule Take 5 mg by mouth every 6 (six) hours as needed for pain.  ? pantoprazole (PROTONIX) 40 MG tablet TAKE 1 TABLET EVERY DAY (Patient taking differently: Take 40 mg by mouth daily.)  ? potassium chloride SA (KLOR-CON M) 20 MEQ tablet Take 1 tablet (20 mEq total) by mouth daily.  ? traMADol (ULTRAM) 50 MG tablet Take 50 mg by mouth every 6 (six) hours as needed for moderate pain or severe pain.  ?  ? ?Allergies:   Pravastatin, Ativan [lorazepam], Fenofibrate, Oxytetracycline, Penicillins, Septra [sulfamethoxazole-trimethoprim], Sulfa antibiotics, and Tetracyclines & related  ? ?Social History  ? ?Socioeconomic History  ? Marital status: Widowed  ?  Spouse name: Not on file  ? Number of children: Not on file  ? Years of  education: Not on file  ? Highest education level: Not on file  ?Occupational History  ? Occupation: retired  ?Tobacco Use  ? Smoking status: Never  ? Smokeless tobacco: Current  ?  Types: Snuff  ? Tobacco comments:  ?  one can a week  ?Vaping Use  ? Vaping Use: Never used  ?Substance and Sexual Activity  ? Alcohol use: No  ? Drug use: No  ? Sexual activity: Not Currently  ?Other Topics Concern  ? Not on file  ?Social History Narrative  ? Not on file  ? ?Social Determinants of Health  ? ?Financial Resource Strain: Not on file  ?Food Insecurity: Not on file  ?Transportation Needs: Not on file  ?Physical Activity: Not on file  ?Stress: Not on file  ?Social Connections: Not on file  ?  ? ?Family History: ?The patient's family history includes Alcohol abuse in her father; Cancer in  her mother; Diabetes in her mother; Heart disease in her father and mother. ?ROS:   ?Please see the history of present illness.    ?All 14 point review of systems negative except as described per history of present illness ? ?EKGs/Labs/Other Studies Reviewed:   ? ? ? ?Recent Labs: ?03/02/2022: ALT 30; BUN 21; Creatinine, Ser 0.93; Hemoglobin 10.7; Platelets 508; Potassium 4.0; Sodium 139; TSH 3.690  ?Recent Lipid Panel ?   ?Component Value Date/Time  ? CHOL 117 03/02/2022 0942  ? TRIG 195 (H) 03/02/2022 0942  ? HDL 40 03/02/2022 0942  ? CHOLHDL 2.9 03/02/2022 0942  ? [place] 45 03/02/2022 0942  ? ? ?Physical Exam:   ? ?VS:  BP 130/60 (BP Location: Left Arm, Patient Position: Sitting)   Pulse 62   Ht 5' (1.524 m)   Wt 97 lb (44 kg)   SpO2 98%   BMI 18.94 kg/m?    ? ?Wt Readings from Last 3 Encounters:  ?03/09/22 97 lb (44 kg)  ?02/10/22 97 lb (44 kg)  ?01/27/22 107 lb 3.2 oz (48.6 kg)  ?  ? ?GEN:  Well nourished, well developed in no acute distress ?HEENT: Normal ?NECK: No JVD; No carotid bruits ?LYMPHATICS: No lymphadenopathy ?CARDIAC: RRR, no murmurs, no rubs, no gallops ?RESPIRATORY:  Clear to auscultation without rales, wheezing or  rhonchi  ?ABDOMEN: Soft, non-tender, non-distended ?MUSCULOSKELETAL:  No edema; No deformity  ?SKIN: Warm and dry ?LOWER EXTREMITIES: no swelling ?NEUROLOGIC:  Alert and oriented x 3 ?PSYCHIATRIC:  Normal affect  ? ?ASSESSM

## 2022-03-19 ENCOUNTER — Other Ambulatory Visit: Payer: Self-pay | Admitting: Physician Assistant

## 2022-03-19 DIAGNOSIS — E119 Type 2 diabetes mellitus without complications: Secondary | ICD-10-CM

## 2022-03-19 DIAGNOSIS — I10 Essential (primary) hypertension: Secondary | ICD-10-CM

## 2022-03-25 DIAGNOSIS — S92331A Displaced fracture of third metatarsal bone, right foot, initial encounter for closed fracture: Secondary | ICD-10-CM | POA: Diagnosis not present

## 2022-03-25 DIAGNOSIS — S92341A Displaced fracture of fourth metatarsal bone, right foot, initial encounter for closed fracture: Secondary | ICD-10-CM | POA: Diagnosis not present

## 2022-03-25 DIAGNOSIS — S42202A Unspecified fracture of upper end of left humerus, initial encounter for closed fracture: Secondary | ICD-10-CM | POA: Diagnosis not present

## 2022-03-25 LAB — IRON AND TIBC
Iron Saturation: 21 % (ref 15–55)
Iron: 61 ug/dL (ref 27–139)
Total Iron Binding Capacity: 285 ug/dL (ref 250–450)
UIBC: 224 ug/dL (ref 118–369)

## 2022-03-25 LAB — SPECIMEN STATUS REPORT

## 2022-03-25 LAB — FERRITIN: Ferritin: 188 ng/mL — ABNORMAL HIGH (ref 15–150)

## 2022-03-26 ENCOUNTER — Other Ambulatory Visit: Payer: Self-pay | Admitting: Cardiology

## 2022-04-02 ENCOUNTER — Ambulatory Visit: Payer: Medicare HMO

## 2022-04-06 DIAGNOSIS — Z7984 Long term (current) use of oral hypoglycemic drugs: Secondary | ICD-10-CM | POA: Diagnosis not present

## 2022-04-06 DIAGNOSIS — E119 Type 2 diabetes mellitus without complications: Secondary | ICD-10-CM | POA: Diagnosis not present

## 2022-04-06 DIAGNOSIS — L603 Nail dystrophy: Secondary | ICD-10-CM | POA: Diagnosis not present

## 2022-04-06 DIAGNOSIS — L602 Onychogryphosis: Secondary | ICD-10-CM | POA: Diagnosis not present

## 2022-04-07 ENCOUNTER — Ambulatory Visit (INDEPENDENT_AMBULATORY_CARE_PROVIDER_SITE_OTHER): Payer: Medicare HMO

## 2022-04-07 DIAGNOSIS — R899 Unspecified abnormal finding in specimens from other organs, systems and tissues: Secondary | ICD-10-CM | POA: Diagnosis not present

## 2022-04-07 LAB — POC HEMOCCULT BLD/STL (HOME/3-CARD/SCREEN)
Card #2 Fecal Occult Blod, POC: NEGATIVE
Card #3 Fecal Occult Blood, POC: NEGATIVE
Fecal Occult Blood, POC: NEGATIVE

## 2022-04-15 ENCOUNTER — Other Ambulatory Visit: Payer: Self-pay | Admitting: Physician Assistant

## 2022-04-15 DIAGNOSIS — E876 Hypokalemia: Secondary | ICD-10-CM

## 2022-04-21 ENCOUNTER — Other Ambulatory Visit: Payer: Self-pay | Admitting: Physician Assistant

## 2022-04-22 DIAGNOSIS — S92341A Displaced fracture of fourth metatarsal bone, right foot, initial encounter for closed fracture: Secondary | ICD-10-CM | POA: Diagnosis not present

## 2022-04-22 DIAGNOSIS — S42202A Unspecified fracture of upper end of left humerus, initial encounter for closed fracture: Secondary | ICD-10-CM | POA: Diagnosis not present

## 2022-04-22 DIAGNOSIS — S92331A Displaced fracture of third metatarsal bone, right foot, initial encounter for closed fracture: Secondary | ICD-10-CM | POA: Diagnosis not present

## 2022-04-30 ENCOUNTER — Other Ambulatory Visit: Payer: Self-pay | Admitting: Physician Assistant

## 2022-04-30 DIAGNOSIS — E782 Mixed hyperlipidemia: Secondary | ICD-10-CM

## 2022-05-08 ENCOUNTER — Other Ambulatory Visit: Payer: Self-pay | Admitting: Physician Assistant

## 2022-05-08 DIAGNOSIS — E119 Type 2 diabetes mellitus without complications: Secondary | ICD-10-CM

## 2022-05-12 ENCOUNTER — Ambulatory Visit: Payer: Medicare HMO | Admitting: Physician Assistant

## 2022-05-12 ENCOUNTER — Ambulatory Visit (INDEPENDENT_AMBULATORY_CARE_PROVIDER_SITE_OTHER): Payer: Medicare HMO | Admitting: Family Medicine

## 2022-05-12 VITALS — BP 164/70 | HR 56 | Temp 96.6°F | Resp 16 | Ht 60.0 in | Wt 102.0 lb

## 2022-05-12 DIAGNOSIS — N3000 Acute cystitis without hematuria: Secondary | ICD-10-CM

## 2022-05-12 LAB — POCT URINALYSIS DIPSTICK
Bilirubin, UA: NEGATIVE
Blood, UA: NEGATIVE
Glucose, UA: NEGATIVE
Ketones, UA: NEGATIVE
Nitrite, UA: NEGATIVE
Protein, UA: POSITIVE — AB
Spec Grav, UA: 1.015 (ref 1.010–1.025)
Urobilinogen, UA: 0.2 E.U./dL
pH, UA: 6 (ref 5.0–8.0)

## 2022-05-12 MED ORDER — NITROFURANTOIN MONOHYD MACRO 100 MG PO CAPS: 100.0000 mg | ORAL_CAPSULE | Freq: Two times a day (BID) | ORAL | 0 refills | Status: DC

## 2022-05-12 NOTE — Progress Notes (Signed)
Acute Office Visit  Subjective:    Patient ID: Nancy Lang, female    DOB: 1946-10-11, 76 y.o.   MRN: [id number]  Chief Complaint  Patient presents with   Urinary Tract Infection   Cough    HPI: Patient is in today for burning with urination and cough which started about 3 days ago.  Patient is sleepy all the time during the daytime.  She has problems with insomnia at night.  Reports she is always cold.  She had increased urination over the last 2 weeks.  Her appetite is decreased.  She is very ill and hateful which frequently happens when she has urinary tract infection but also otherwise.  She has not seen Dr. Doy Hutching in a few years and they are wondering about a new neurology referral.  Also question a thyroid doctor.  Apparently she saw Dr. Posey Pronto a few years ago however her recent thyroid level appears to be normal.  Has also had a little bit of a cough for the last 3 days.  Other family members have also had issues.  Blood pressure is high today.  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   Coronary artery disease involving native coronary artery of native heart 10/07/2016   Overview:  PTCA and drug-eluting stents to mid proximal portion to LAD and mid and proximal portion of circumflex in 2017 Drug-eluting stent mid LAD in May 2018   Dementia without behavioral disturbance ([place]) 03/10/2017   Diabetes mellitus without complication ([facility])    Dyslipidemia (high LDL; low HDL) 10/07/2016   Essential hypertension, benign 10/07/2016   GERD (gastroesophageal reflux disease)    Hyperlipidemia    Hypertension    Insulin dependent type 2 diabetes mellitus, controlled ([place])  10/07/2016   Malaise 01/20/2021   Memory impairment 03/10/2017   Mixed hyperlipidemia 12/06/2019   NSTEMI (non-ST elevated myocardial infarction) ([place]) 09/07/2016   Thyroid disease    Vitamin D deficiency 03/08/2017    Past Surgical History:  Procedure Laterality Date   CARDIAC CATHETERIZATION      Family History  Problem Relation Age of Onset   Diabetes Mother    Cancer Mother    Heart disease Mother    Heart disease Father    Alcohol abuse Father     Social History   Socioeconomic History   Marital status: Widowed    Spouse name: Not on file   Number of children: Not on file   Years of education: Not on file   Highest education level: Not on file  Occupational History   Occupation: retired  Tobacco Use   Smoking status: Never   Smokeless tobacco: Current    Types: Snuff   Tobacco comments:    one can a week  Vaping Use   Vaping Use: Never used  Substance and Sexual Activity   Alcohol use: No   Drug use: No   Sexual activity: Not Currently  Other Topics Concern   Not on file  Social History Narrative   Not on file   Social Determinants of Health   Financial Resource Strain: Not on file  Food Insecurity: Not on file  Transportation Needs: Not on file  Physical Activity: Not on file  Stress: Not on file  Social Connections: Not on file  Intimate Partner Violence: Not on file    Outpatient Medications Prior to Visit  Medication Sig Dispense Refill   Accu-Chek FastClix Lancets MISC 1 each by Other route See admin instructions. Just Lancets Unknown size     ascorbic acid (VITAMIN C) 500 MG tablet Take 500 mg by mouth daily.     aspirin 81 MG chewable tablet Chew 81 mg by mouth daily.      atorvastatin (LIPITOR) 80 MG tablet Take 1 tablet (80 mg total) by mouth daily. 90 tablet 0   Calcium-Magnesium-Zinc (CAL-MAG-ZINC PO) Take 1 tablet by mouth daily with breakfast. Unknown strength per patient     Cholecalciferol (VITAMIN D3) 1000 units CAPS Take 1,000  Units by mouth daily.      dicyclomine (BENTYL) 10 MG capsule Take 1 capsule (10 mg total) by mouth 3 (three) times daily before meals. 270 capsule 1   donepezil (ARICEPT) 10 MG tablet TAKE 1 TABLET AT BEDTIME 90 tablet 1   DROPLET PEN NEEDLES 32G X 4 MM MISC USE AS DIRECTED ONE TIME DAILY (Patient taking differently: 1 each by Other route as directed. Glucose check) 100 each 3   glucose blood (ACCU-CHEK GUIDE) test strip CHECK BLOOD GLUCOSE THREE TIMES DAILY (Patient taking differently: 1 each by Other route See admin instructions. CHECK BLOOD GLUCOSE THREE TIMES DAILY) 300 strip 1   isosorbide mononitrate (IMDUR) 30 MG 24 hr tablet Take 1 tablet (30 mg total) by mouth daily. 90 tablet 2   LANTUS SOLOSTAR 100 UNIT/ML Solostar Pen INJECT 30 UNITS UNDER THE SKIN EVERY MORNING 15 mL 0   levothyroxine (SYNTHROID) 50 MCG tablet TAKE 1 TABLET EVERY DAY (Patient taking differently: Take 50 mcg by mouth daily before breakfast.) 90 tablet 1   loperamide (IMODIUM A-D) 2 MG tablet Take 1 tablet (2 mg total) by mouth 2 (two) times daily as needed for diarrhea or loose stools. 60 tablet 5   memantine (NAMENDA) 10 MG tablet TAKE 1 TABLET TWICE DAILY 180 tablet 0   metoprolol tartrate (LOPRESSOR) 100 MG tablet TAKE 1 TABLET TWICE DAILY 180 tablet 0   montelukast (SINGULAIR) 10 MG tablet TAKE 1 TABLET EVERY DAY (Patient taking differently: Take 10 mg by mouth at bedtime.) 90 tablet 3   Multiple Vitamins-Minerals (ONE-A-DAY PROACTIVE 65+) TABS Take 1 tablet by mouth daily with breakfast. Unknown strength per patient     Omega-3 1000 MG CAPS Take 1,000 mg by mouth daily.      pantoprazole (PROTONIX) 40 MG tablet TAKE 1 TABLET EVERY DAY (Patient taking differently: Take 40 mg by mouth daily.) 90 tablet 1   traMADol (ULTRAM) 50 MG tablet Take 50 mg by mouth every 6 (six) hours as needed for moderate pain or severe pain.     diclofenac Sodium (VOLTAREN) 1 % GEL Apply 2 g topically 4 (four) times daily. 100 g 2    lisinopril (ZESTRIL) 10 MG tablet TAKE 1 TABLET EVERY DAY (Patient taking differently: Take 10 mg by mouth daily. TAKE 1 TABLET EVERY DAY) 90 tablet 0   oxycodone (OXY-IR) 5 MG capsule Take 5 mg by mouth every 6 (six) hours as needed for pain.     potassium chloride SA (KLOR-CON M) 20 MEQ tablet TAKE 1 TABLET(20 MEQ) BY MOUTH DAILY 30 tablet 1   No facility-administered medications prior to visit.    Allergies  Allergen Reactions   Pravastatin  ANGER AND MEMORY ISSUES   Ativan [Lorazepam] Anxiety and Other (See Comments)    Made the patient "paranoid and mean" (per family)- also worsened anxiety   Fenofibrate Rash   Oxytetracycline Rash   Penicillins Rash   Septra [Sulfamethoxazole-Trimethoprim] Rash   Sulfa Antibiotics Rash   Tetracyclines & Related Rash    Review of Systems  Constitutional:  Positive for appetite change and chills.  Respiratory:  Positive for cough.   Gastrointestinal:  Positive for abdominal pain.  Genitourinary:  Positive for dysuria and frequency.  Psychiatric/Behavioral:  Positive for agitation and sleep disturbance. Negative for dysphoric mood.        Objective:    Physical Exam Vitals reviewed.  Constitutional:      Appearance: Normal appearance. She is normal weight.  Neck:     Vascular: No carotid bruit.  Cardiovascular:     Rate and Rhythm: Normal rate and regular rhythm.     Heart sounds: Normal heart sounds.  Pulmonary:     Effort: Pulmonary effort is normal. No respiratory distress.     Breath sounds: Normal breath sounds.  Abdominal:     General: Abdomen is flat. Bowel sounds are normal.     Palpations: Abdomen is soft.     Tenderness: There is no abdominal tenderness.  Neurological:     Mental Status: She is alert and oriented to person, place, and time.  Psychiatric:        Mood and Affect: Mood normal.        Behavior: Behavior normal.     BP (!) 164/70   Pulse (!) 56   Temp (!) 96.6 F (35.9 C)   Resp 16   Ht 5'  (1.524 m)   Wt 102 lb (46.3 kg)   BMI 19.92 kg/m  Wt Readings from Last 3 Encounters:  05/12/22 102 lb (46.3 kg)  03/09/22 97 lb (44 kg)  02/10/22 97 lb (44 kg)    Health Maintenance Due  Topic Date Due   Zoster Vaccines- Shingrix (1 of 2) Never done   Pneumonia Vaccine 40+ Years old (2 - PPSV23 or PCV20) 12/25/2014   MAMMOGRAM  07/31/2020   TETANUS/TDAP  04/25/2021   OPHTHALMOLOGY EXAM  05/13/2021    There are no preventive care reminders to display for this patient.   Lab Results  Component Value Date   TSH 3.690 03/02/2022   Lab Results  Component Value Date   WBC 10.5 03/02/2022   HGB 10.7 (L) 03/02/2022   HCT 32.7 (L) 03/02/2022   MCV 95 03/02/2022   PLT 508 (H) 03/02/2022   Lab Results  Component Value Date   NA 139 03/02/2022   K 4.0 03/02/2022   CO2 23 03/02/2022   GLUCOSE 195 (H) 03/02/2022   BUN 21 03/02/2022   CREATININE 0.93 03/02/2022   BILITOT 1.1 03/02/2022   ALKPHOS 148 (H) 03/02/2022   AST 26 03/02/2022   ALT 30 03/02/2022   PROT 6.1 03/02/2022   ALBUMIN 4.0 03/02/2022   CALCIUM 9.8 03/02/2022   EGFR 63 03/02/2022   Lab Results  Component Value Date   CHOL 117 03/02/2022   Lab Results  Component Value Date   HDL 40 03/02/2022   Lab Results  Component Value Date   LDLCALC 45 03/02/2022   Lab Results  Component Value Date   TRIG 195 (H) 03/02/2022   Lab Results  Component Value Date   CHOLHDL 2.9 03/02/2022   Lab Results  Component Value Date  HGBA1C 6.7 (H) 03/02/2022       Assessment & Plan:   Problem List Items Addressed This Visit       Genitourinary   Acute cystitis without hematuria - Primary    Macrobid 100 mg one twice a day x 7 days.   Discuss other concerns with Gay Filler when she follow up next month.       Relevant Orders   POCT urinalysis dipstick (Completed)   Urine Culture (Completed)   Meds ordered this encounter  Medications   DISCONTD: nitrofurantoin, macrocrystal-monohydrate, (MACROBID) 100  MG capsule    Sig: Take 1 capsule (100 mg total) by mouth 2 (two) times daily.    Dispense:  14 capsule    Refill:  0    Orders Placed This Encounter  Procedures   Urine Culture   POCT urinalysis dipstick     Follow-up: Return in about 27 days (around 06/08/2022) for 40 minutes please.  An After Visit Summary was printed and given to the patient.  Rochel Brome, MD [facility] [phone]

## 2022-05-12 NOTE — Patient Instructions (Signed)
Macrobid 100 mg one twice a day x 7 days.

## 2022-05-13 ENCOUNTER — Other Ambulatory Visit: Payer: Self-pay | Admitting: Physician Assistant

## 2022-05-13 LAB — URINE CULTURE: Organism ID, Bacteria: NO GROWTH

## 2022-05-31 ENCOUNTER — Other Ambulatory Visit: Payer: Self-pay | Admitting: Physician Assistant

## 2022-05-31 DIAGNOSIS — I10 Essential (primary) hypertension: Secondary | ICD-10-CM

## 2022-06-07 ENCOUNTER — Other Ambulatory Visit: Payer: Self-pay | Admitting: Physician Assistant

## 2022-06-07 DIAGNOSIS — M25551 Pain in right hip: Secondary | ICD-10-CM

## 2022-06-10 ENCOUNTER — Other Ambulatory Visit: Payer: Self-pay | Admitting: Physician Assistant

## 2022-06-10 DIAGNOSIS — E876 Hypokalemia: Secondary | ICD-10-CM

## 2022-06-10 MED ORDER — POTASSIUM CHLORIDE CRYS ER 20 MEQ PO TBCR: EXTENDED_RELEASE_TABLET | ORAL | 1 refills | Status: DC

## 2022-06-14 ENCOUNTER — Encounter: Payer: Self-pay | Admitting: Family Medicine

## 2022-06-14 NOTE — Assessment & Plan Note (Addendum)
Macrobid 100 mg one twice a day x 7 days.   Discuss other concerns with Nancy Lang when she follow up next month.

## 2022-06-17 ENCOUNTER — Encounter: Payer: Self-pay | Admitting: Physician Assistant

## 2022-06-17 ENCOUNTER — Ambulatory Visit (INDEPENDENT_AMBULATORY_CARE_PROVIDER_SITE_OTHER): Payer: Medicare HMO | Admitting: Physician Assistant

## 2022-06-17 VITALS — BP 130/70 | HR 57 | Temp 97.1°F | Ht 60.0 in | Wt 98.2 lb

## 2022-06-17 DIAGNOSIS — E039 Hypothyroidism, unspecified: Secondary | ICD-10-CM | POA: Diagnosis not present

## 2022-06-17 DIAGNOSIS — E559 Vitamin D deficiency, unspecified: Secondary | ICD-10-CM

## 2022-06-17 DIAGNOSIS — E119 Type 2 diabetes mellitus without complications: Secondary | ICD-10-CM

## 2022-06-17 DIAGNOSIS — N3000 Acute cystitis without hematuria: Secondary | ICD-10-CM | POA: Diagnosis not present

## 2022-06-17 DIAGNOSIS — R899 Unspecified abnormal finding in specimens from other organs, systems and tissues: Secondary | ICD-10-CM

## 2022-06-17 DIAGNOSIS — R82998 Other abnormal findings in urine: Secondary | ICD-10-CM | POA: Diagnosis not present

## 2022-06-17 DIAGNOSIS — E782 Mixed hyperlipidemia: Secondary | ICD-10-CM | POA: Diagnosis not present

## 2022-06-17 DIAGNOSIS — Z794 Long term (current) use of insulin: Secondary | ICD-10-CM | POA: Diagnosis not present

## 2022-06-17 DIAGNOSIS — E785 Hyperlipidemia, unspecified: Secondary | ICD-10-CM

## 2022-06-17 DIAGNOSIS — Z23 Encounter for immunization: Secondary | ICD-10-CM

## 2022-06-17 DIAGNOSIS — F0390 Unspecified dementia without behavioral disturbance: Secondary | ICD-10-CM | POA: Diagnosis not present

## 2022-06-17 DIAGNOSIS — K2100 Gastro-esophageal reflux disease with esophagitis, without bleeding: Secondary | ICD-10-CM

## 2022-06-17 DIAGNOSIS — E1165 Type 2 diabetes mellitus with hyperglycemia: Secondary | ICD-10-CM

## 2022-06-17 DIAGNOSIS — I10 Essential (primary) hypertension: Secondary | ICD-10-CM | POA: Diagnosis not present

## 2022-06-17 DIAGNOSIS — Z1231 Encounter for screening mammogram for malignant neoplasm of breast: Secondary | ICD-10-CM

## 2022-06-17 LAB — POCT URINALYSIS DIP (CLINITEK)
Bilirubin, UA: NEGATIVE
Blood, UA: NEGATIVE
Glucose, UA: NEGATIVE mg/dL
Ketones, POC UA: NEGATIVE mg/dL
Nitrite, UA: NEGATIVE
POC PROTEIN,UA: 30 — AB
Spec Grav, UA: 1.02 (ref 1.010–1.025)
Urobilinogen, UA: 0.2 E.U./dL
pH, UA: 6 (ref 5.0–8.0)

## 2022-06-17 MED ORDER — LANTUS SOLOSTAR 100 UNIT/ML ~~LOC~~ SOPN: 20.0000 [IU] | PEN_INJECTOR | Freq: Every day | SUBCUTANEOUS | 0 refills | Status: DC

## 2022-06-17 NOTE — Progress Notes (Signed)
Established Patient Office Visit  Subjective:  Patient ID: Nancy Lang, female    DOB: Oct 12, 1946  Age: 76 y.o. MRN: [id number]  CC:  Chief Complaint  Patient presents with   Diabetes    HPI Nancy Lang presents for chronic follow up with her daughter who gives most of history  Mixed hyperlipidemia  Pt presents with hyperlipidemia. . Compliance with treatment has been good The patient is compliant with medications, maintains a low cholesterol diet , follows up as directed ,  The patient denies experiencing any hypercholesterolemia related symptoms.  She is currently taking lipitor 47m qd  Pt presents for follow up of hypertension.  The patient is tolerating the medication well without side effects. Compliance with treatment has been good; including taking medication as directed , maintains a healthy diet and regular exercise regimen , and following up as directed. She is  currently taking zestril 181mand lopressor 10076mhe also follows with cardiology for history of CAD and was placed on imdur 3m48mhe will be due for follow up with Dr KrasAgustin CreeNovember  Dementia: She is here for follow upt of cognitive problems. She is accompanied by  daughter.  Primary caregiver is her. She states that her symptoms are stable at this time Functional Assessment:  Activities of Daily Living (ADLs):   She is independent in the following: feeding Requires assistance with the following: bathing and hygiene, toileting and dressing Currently on Namenda 10mg23mand aricept 10mg 50m-- she has actually not seen neurology for follow up in over a year - daughter is given the number to Dr FeraruDoy Hutchingll and make appt  Hypothyroidism: Patient presents for follow up of hypothyroidism - She has had some recent fatigue - she is currently on levothyroxine 50mcg 47mPt for follow up for diabetes -daughter states that since making diet changes a few weeks ago overall her glucose has remained  stable fasting with range of 70s-120s - at times in evenings glucose can get above 300 depending on what she eats. - currently pt is on lantus 20mg qd55m with history of GERD and IBS - pt is currently taking bentyl and protonix - she is due for follow up with Dr Butler aMelina Copaghter will contact his office for appt  Pt with history of low vitamin D - due for labwork  Daughter has noted strong odor of patient's urine but has no other uti symptoms  Pt would like to have pneumonia shot updated and she would like to be scheduled for screening mammogram  Past Medical History:  Diagnosis Date   Acquired hypothyroidism 12/31/2015   Acute cystitis without hematuria 06/12/2020   Acute laryngopharyngitis 05/23/2020   Angina pectoris (HCC)    Firthpical nevus 06/12/2020   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, stenosis left P2   Cerebral hemorrhage (HCC) 7/1[place]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, 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   Coronary artery disease involving native coronary artery of native heart 10/07/2016   Overview:  PTCA and drug-eluting stents to mid proximal portion to LAD and mid and proximal portion of circumflex in 2017 Drug-eluting stent mid LAD in May 2018   Dementia without behavioral disturbance (HCC) 5/1[place]018   Diabetes mellitus without complication (HCC)    [place] (high LDL; low HDL) 10/07/2016   Essential hypertension, benign 10/07/2016   GERD (gastroesophageal reflux disease)    Hyperlipidemia    Hypertension    Insulin dependent type 2 diabetes  mellitus, controlled ([place]) 10/07/2016   Malaise 01/20/2021   Memory impairment 03/10/2017   Mixed hyperlipidemia 12/06/2019   NSTEMI (non-ST elevated myocardial infarction) ([place]) 09/07/2016   Thyroid disease    Vitamin D deficiency 03/08/2017    Past Surgical History:  Procedure Laterality Date   CARDIAC CATHETERIZATION      Family History  Problem Relation Age of Onset   Diabetes Mother    Cancer Mother    Heart disease Mother    Heart  disease Father    Alcohol abuse Father     Social History   Socioeconomic History   Marital status: Widowed    Spouse name: Not on file   Number of children: Not on file   Years of education: Not on file   Highest education level: Not on file  Occupational History   Occupation: retired  Tobacco Use   Smoking status: Never   Smokeless tobacco: Current    Types: Snuff   Tobacco comments:    one can a week  Vaping Use   Vaping Use: Never used  Substance and Sexual Activity   Alcohol use: No   Drug use: No   Sexual activity: Not Currently  Other Topics Concern   Not on file  Social History Narrative   Not on file   Social Determinants of Health   Financial Resource Strain: Not on file  Food Insecurity: Not on file  Transportation Needs: Not on file  Physical Activity: Not on file  Stress: Not on file  Social Connections: Not on file  Intimate Partner Violence: Not on file     Current Outpatient Medications:    Accu-Chek FastClix Lancets MISC, 1 each by Other route See admin instructions. Just Lancets Unknown size, Disp: , Rfl:    ascorbic acid (VITAMIN C) 500 MG tablet, Take 500 mg by mouth daily., Disp: , Rfl:    aspirin 81 MG chewable tablet, Chew 81 mg by mouth daily. , Disp: , Rfl:    atorvastatin (LIPITOR) 80 MG tablet, Take 1 tablet (80 mg total) by mouth daily., Disp: 90 tablet, Rfl: 0   Calcium-Magnesium-Zinc (CAL-MAG-ZINC PO), Take 1 tablet by mouth daily with breakfast. Unknown strength per patient, Disp: , Rfl:    Cholecalciferol (VITAMIN D3) 1000 units CAPS, Take 1,000 Units by mouth daily. , Disp: , Rfl:    diclofenac Sodium (VOLTAREN) 1 % GEL, APPLY 2 GRAMS TO AFFECTED AREA(S) FOUR TIMES DAILY, Disp: 300 g, Rfl: 0   dicyclomine (BENTYL) 10 MG capsule, Take 1 capsule (10 mg total) by mouth 3 (three) times daily before meals., Disp: 270 capsule, Rfl: 1   donepezil (ARICEPT) 10 MG tablet, TAKE 1 TABLET AT BEDTIME, Disp: 90 tablet, Rfl: 1   DROPLET PEN  NEEDLES 32G X 4 MM MISC, USE AS DIRECTED ONE TIME DAILY (Patient taking differently: 1 each by Other route as directed. Glucose check), Disp: 100 each, Rfl: 3   glucose blood (ACCU-CHEK GUIDE) test strip, CHECK BLOOD GLUCOSE THREE TIMES DAILY (Patient taking differently: 1 each by Other route See admin instructions. CHECK BLOOD GLUCOSE THREE TIMES DAILY), Disp: 300 strip, Rfl: 1   isosorbide mononitrate (IMDUR) 30 MG 24 hr tablet, Take 1 tablet (30 mg total) by mouth daily., Disp: 90 tablet, Rfl: 2   levothyroxine (SYNTHROID) 50 MCG tablet, TAKE 1 TABLET EVERY DAY (Patient taking differently: Take 50 mcg by mouth daily before breakfast.), Disp: 90 tablet, Rfl: 1   lisinopril (ZESTRIL) 10 MG tablet, TAKE 1 TABLET EVERY  DAY, Disp: 90 tablet, Rfl: 0   loperamide (IMODIUM A-D) 2 MG tablet, Take 1 tablet (2 mg total) by mouth 2 (two) times daily as needed for diarrhea or loose stools., Disp: 60 tablet, Rfl: 5   memantine (NAMENDA) 10 MG tablet, TAKE 1 TABLET TWICE DAILY, Disp: 180 tablet, Rfl: 0   metoprolol tartrate (LOPRESSOR) 100 MG tablet, TAKE 1 TABLET TWICE DAILY, Disp: 180 tablet, Rfl: 0   montelukast (SINGULAIR) 10 MG tablet, TAKE 1 TABLET EVERY DAY (Patient taking differently: Take 10 mg by mouth at bedtime.), Disp: 90 tablet, Rfl: 3   Multiple Vitamins-Minerals (ONE-A-DAY PROACTIVE 65+) TABS, Take 1 tablet by mouth daily with breakfast. Unknown strength per patient, Disp: , Rfl:    nitrofurantoin, macrocrystal-monohydrate, (MACROBID) 100 MG capsule, TAKE 1 CAPSULE EVERY DAY WITH BREAKFAST, Disp: 90 capsule, Rfl: 0   Omega-3 1000 MG CAPS, Take 1,000 mg by mouth daily. , Disp: , Rfl:    pantoprazole (PROTONIX) 40 MG tablet, TAKE 1 TABLET EVERY DAY (Patient taking differently: Take 40 mg by mouth daily.), Disp: 90 tablet, Rfl: 1   potassium chloride SA (KLOR-CON M) 20 MEQ tablet, TAKE 1 TABLET(20 MEQ) BY MOUTH DAILY, Disp: 90 tablet, Rfl: 1   insulin glargine (LANTUS SOLOSTAR) 100 UNIT/ML Solostar  Pen, Inject 20 Units into the skin daily., Disp: 15 mL, Rfl: 0   Allergies  Allergen Reactions   Pravastatin     ANGER AND MEMORY ISSUES   Ativan [Lorazepam] Anxiety and Other (See Comments)    Made the patient "paranoid and mean" (per family)- also worsened anxiety   Fenofibrate Rash   Oxytetracycline Rash   Penicillins Rash   Septra [Sulfamethoxazole-Trimethoprim] Rash   Sulfa Antibiotics Rash   Tetracyclines & Related Rash    CONSTITUTIONAL: Negative for chills, fatigue, fever, unintentional weight gain and unintentional weight loss.  E/N/T: Negative for ear pain, nasal congestion and sore throat.  CARDIOVASCULAR: Negative for chest pain, dizziness, palpitations and pedal edema.  RESPIRATORY: Negative for recent cough and dyspnea.  GASTROINTESTINAL: Negative for abdominal pain, acid reflux symptoms, constipation, diarrhea, nausea and vomiting.  MSK: Negative for arthralgias and myalgias.  INTEGUMENTARY: Negative for rash.  NEUROLOGICAL: Negative for dizziness and headaches.  PSYCHIATRIC: Negative for sleep disturbance and to question depression screen.  Negative for depression, negative for anhedonia.         Objective:  PHYSICAL EXAM:   VS: BP 130/70 (BP Location: Left Arm, Patient Position: Sitting, Cuff Size: Small)   Pulse (!) 57   Temp (!) 97.1 F (36.2 C) (Temporal)   Ht 5' (1.524 m)   Wt 98 lb 3.2 oz (44.5 kg)   SpO2 90%   BMI 19.18 kg/m   GEN: Well nourished, well developed, in no acute distress   Cardiac: RRR; no murmurs, rubs, or gallops,no edema -  Respiratory:  normal respiratory rate and pattern with no distress - normal breath sounds with no rales, rhonchi, wheezes or rubs GI: normal bowel sounds, no masses or tenderness MS: no deformity or atrophy  Skin: warm and dry, no rash  Neuro:  Alert and Oriented ,- CN II-Xii grossly intact Psych: euthymic mood, appropriate affect and demeanor   Office Visit on 06/17/2022  Component Date Value Ref Range  Status   Color, UA 06/17/2022 yellow  yellow Final   Clarity, UA 06/17/2022 clear  clear Final   Glucose, UA 06/17/2022 negative  negative mg/dL Final   Bilirubin, UA 06/17/2022 negative  negative Final   Ketones, POC UA  06/17/2022 negative  negative mg/dL Final   Spec Grav, UA 06/17/2022 1.020  1.010 - 1.025 Final   Blood, UA 06/17/2022 negative  negative Final   pH, UA 06/17/2022 6.0  5.0 - 8.0 Final   POC PROTEIN,UA 06/17/2022 =30 (A)  negative, trace Final   Urobilinogen, UA 06/17/2022 0.2  0.2 or 1.0 E.U./dL Final   Nitrite, UA 06/17/2022 Negative  Negative Final   Leukocytes, UA 06/17/2022 Trace (A)  Negative Final      Health Maintenance Due  Topic Date Due   Zoster Vaccines- Shingrix (1 of 2) Never done   MAMMOGRAM  07/31/2020   TETANUS/TDAP  04/25/2021   OPHTHALMOLOGY EXAM  05/13/2021    There are no preventive care reminders to display for this patient.  Lab Results  Component Value Date   TSH 3.690 03/02/2022   Lab Results  Component Value Date   WBC 10.5 03/02/2022   HGB 10.7 (L) 03/02/2022   HCT 32.7 (L) 03/02/2022   MCV 95 03/02/2022   PLT 508 (H) 03/02/2022   Lab Results  Component Value Date   NA 139 03/02/2022   K 4.0 03/02/2022   CO2 23 03/02/2022   GLUCOSE 195 (H) 03/02/2022   BUN 21 03/02/2022   CREATININE 0.93 03/02/2022   BILITOT 1.1 03/02/2022   ALKPHOS 148 (H) 03/02/2022   AST 26 03/02/2022   ALT 30 03/02/2022   PROT 6.1 03/02/2022   ALBUMIN 4.0 03/02/2022   CALCIUM 9.8 03/02/2022   EGFR 63 03/02/2022   Lab Results  Component Value Date   CHOL 117 03/02/2022   Lab Results  Component Value Date   HDL 40 03/02/2022   Lab Results  Component Value Date   LDLCALC 45 03/02/2022   Lab Results  Component Value Date   TRIG 195 (H) 03/02/2022   Lab Results  Component Value Date   CHOLHDL 2.9 03/02/2022   Lab Results  Component Value Date   HGBA1C 6.7 (H) 03/02/2022      Assessment & Plan:   Problem List Items Addressed  This Visit       Cardiovascular and Mediastinum   Coronary artery disease involving native coronary artery of native heart Continue current meds and follow up with cardiology as directed    Essential hypertension, benign - Primary   Relevant Orders   CBC with Differential/Platelet   Comprehensive metabolic panel Continue meds as directed     Digestive   GERD (gastroesophageal reflux disease)   Relevant Orders   Continue meds Follow up with GI - call for appt     Endocrine   Acquired hypothyroidism   Relevant Orders   TSH Continue synthroid   Insulin dependent type 2 diabetes mellitus, controlled ([place])   Relevant Orders   CBC with Differential/Platelet   Comprehensive metabolic panel   Lipid panel   Hemoglobin A1c Continue lantus as directed     Other   Hyperlipidemia Continue meds and watch diet    Vitamin D insufficiency   Relevant Orders   VITAMIN D 25 Hydroxy (Vit-D Deficiency, Fractures)   Need for Pneumovax Pneumo 23 given  Need for mammogram Mammogram scheduled  Leukocytes in urine Urine culture pending  Dementia Continue meds Daughter to call and schedule follow up with neurologist    Meds ordered this encounter  Medications   insulin glargine (LANTUS SOLOSTAR) 100 UNIT/ML Solostar Pen    Sig: Inject 20 Units into the skin daily.    Dispense:  15 mL  Refill:  0    Order Specific Question:   Supervising Provider    Answer:   Shelton Silvas    Follow-up: Return in about 3 months (around 09/17/2022) for chronic fasting follow up.    SARA R Aleric Froelich, PA-C

## 2022-06-18 LAB — COMPREHENSIVE METABOLIC PANEL
ALT: 30 IU/L (ref 0–32)
AST: 24 IU/L (ref 0–40)
Albumin/Globulin Ratio: 2.3 — ABNORMAL HIGH (ref 1.2–2.2)
Albumin: 4.3 g/dL (ref 3.8–4.8)
Alkaline Phosphatase: 97 IU/L (ref 44–121)
BUN/Creatinine Ratio: 25 (ref 12–28)
BUN: 22 mg/dL (ref 8–27)
Bilirubin Total: 1.1 mg/dL (ref 0.0–1.2)
CO2: 22 mmol/L (ref 20–29)
Calcium: 9.3 mg/dL (ref 8.7–10.3)
Chloride: 105 mmol/L (ref 96–106)
Creatinine, Ser: 0.89 mg/dL (ref 0.57–1.00)
Globulin, Total: 1.9 g/dL (ref 1.5–4.5)
Glucose: 98 mg/dL (ref 70–99)
Potassium: 3.7 mmol/L (ref 3.5–5.2)
Sodium: 146 mmol/L — ABNORMAL HIGH (ref 134–144)
Total Protein: 6.2 g/dL (ref 6.0–8.5)
eGFR: 67 mL/min/{1.73_m2} (ref >59)

## 2022-06-18 LAB — CBC WITH DIFFERENTIAL/PLATELET
Basophils Absolute: 0.1 10*3/uL (ref 0.0–0.2)
Basos: 1 %
EOS (ABSOLUTE): 0.2 10*3/uL (ref 0.0–0.4)
Eos: 2 %
Hematocrit: 35.2 % (ref 34.0–46.6)
Hemoglobin: 11.8 g/dL (ref 11.1–15.9)
Immature Grans (Abs): 0 10*3/uL (ref 0.0–0.1)
Immature Granulocytes: 0 %
Lymphocytes Absolute: 2.2 10*3/uL (ref 0.7–3.1)
Lymphs: 23 %
MCH: 32.4 pg (ref 26.6–33.0)
MCHC: 33.5 g/dL (ref 31.5–35.7)
MCV: 97 fL (ref 79–97)
Monocytes Absolute: 0.8 10*3/uL (ref 0.1–0.9)
Monocytes: 9 %
Neutrophils Absolute: 6.2 10*3/uL (ref 1.4–7.0)
Neutrophils: 65 %
Platelets: 315 10*3/uL (ref 150–450)
RBC: 3.64 x10E6/uL — ABNORMAL LOW (ref 3.77–5.28)
RDW: 13.6 % (ref 11.7–15.4)
WBC: 9.4 10*3/uL (ref 3.4–10.8)

## 2022-06-18 LAB — LIPID PANEL
Chol/HDL Ratio: 2.9 ratio (ref 0.0–4.4)
Cholesterol, Total: 124 mg/dL (ref 100–199)
HDL: 43 mg/dL (ref >39)
LDL Chol Calc (NIH): 54 mg/dL (ref 0–99)
Triglycerides: 160 mg/dL — ABNORMAL HIGH (ref 0–149)
VLDL Cholesterol Cal: 27 mg/dL (ref 5–40)

## 2022-06-18 LAB — IRON,TIBC AND FERRITIN PANEL
Ferritin: 114 ng/mL (ref 15–150)
Iron Saturation: 33 % (ref 15–55)
Iron: 88 ug/dL (ref 27–139)
Total Iron Binding Capacity: 270 ug/dL (ref 250–450)
UIBC: 182 ug/dL (ref 118–369)

## 2022-06-18 LAB — VITAMIN D 25 HYDROXY (VIT D DEFICIENCY, FRACTURES): Vit D, 25-Hydroxy: 92.2 ng/mL (ref 30.0–100.0)

## 2022-06-18 LAB — CARDIOVASCULAR RISK ASSESSMENT

## 2022-06-18 LAB — HEMOGLOBIN A1C
Est. average glucose Bld gHb Est-mCnc: 131 mg/dL
Hgb A1c MFr Bld: 6.2 % — ABNORMAL HIGH (ref 4.8–5.6)

## 2022-06-18 LAB — TSH: TSH: 1.92 u[IU]/mL (ref 0.450–4.500)

## 2022-06-19 LAB — URINE CULTURE

## 2022-06-27 ENCOUNTER — Other Ambulatory Visit: Payer: Self-pay | Admitting: Physician Assistant

## 2022-07-02 ENCOUNTER — Encounter: Payer: Self-pay | Admitting: Physician Assistant

## 2022-07-14 ENCOUNTER — Other Ambulatory Visit: Payer: Self-pay | Admitting: Physician Assistant

## 2022-07-14 DIAGNOSIS — E876 Hypokalemia: Secondary | ICD-10-CM

## 2022-07-19 ENCOUNTER — Other Ambulatory Visit: Payer: Self-pay | Admitting: Family Medicine

## 2022-07-19 ENCOUNTER — Other Ambulatory Visit: Payer: Self-pay | Admitting: Physician Assistant

## 2022-07-19 DIAGNOSIS — E119 Type 2 diabetes mellitus without complications: Secondary | ICD-10-CM

## 2022-07-23 ENCOUNTER — Other Ambulatory Visit: Payer: Self-pay | Admitting: Physician Assistant

## 2022-08-12 ENCOUNTER — Other Ambulatory Visit: Payer: Self-pay | Admitting: Physician Assistant

## 2022-08-12 DIAGNOSIS — M25551 Pain in right hip: Secondary | ICD-10-CM

## 2022-08-14 ENCOUNTER — Other Ambulatory Visit: Payer: Self-pay | Admitting: Physician Assistant

## 2022-08-14 DIAGNOSIS — I10 Essential (primary) hypertension: Secondary | ICD-10-CM

## 2022-09-01 DIAGNOSIS — H209 Unspecified iridocyclitis: Secondary | ICD-10-CM | POA: Diagnosis not present

## 2022-09-01 DIAGNOSIS — Z961 Presence of intraocular lens: Secondary | ICD-10-CM | POA: Diagnosis not present

## 2022-09-01 DIAGNOSIS — H3562 Retinal hemorrhage, left eye: Secondary | ICD-10-CM | POA: Diagnosis not present

## 2022-09-01 DIAGNOSIS — E113413 Type 2 diabetes mellitus with severe nonproliferative diabetic retinopathy with macular edema, bilateral: Secondary | ICD-10-CM | POA: Diagnosis not present

## 2022-09-01 DIAGNOSIS — H524 Presbyopia: Secondary | ICD-10-CM | POA: Diagnosis not present

## 2022-09-01 DIAGNOSIS — Z794 Long term (current) use of insulin: Secondary | ICD-10-CM | POA: Diagnosis not present

## 2022-09-02 ENCOUNTER — Telehealth: Payer: Self-pay

## 2022-09-02 NOTE — Patient Outreach (Signed)
  Care Coordination   09/02/2022 Name: Nancy Lang MRN: [id number] DOB: [birth year]   Care Coordination Outreach Attempts:  An unsuccessful telephone outreach was attempted today to offer the patient information about available care coordination services as a benefit of their health plan.   Follow Up Plan:  Additional outreach attempts will be made to offer the patient care coordination information and services.   Encounter Outcome:  No Answer  Care Coordination Interventions Activated:  No   Care Coordination Interventions:  No, not indicated    Tomasa Rand, RN, BSN, CEN [facility] [employer] [phone]

## 2022-09-04 ENCOUNTER — Telehealth: Payer: Self-pay

## 2022-09-04 NOTE — Patient Outreach (Signed)
  Care Coordination   09/04/2022 Name: Nancy Lang MRN: [id number] DOB: [birth year]   Care Coordination Outreach Attempts:  A second unsuccessful outreach was attempted today to offer the patient with information about available care coordination services as a benefit of their health plan.     Follow Up Plan:  Additional outreach attempts will be made to offer the patient care coordination information and services.   Encounter Outcome:  No Answer  Care Coordination Interventions Activated:  No   Care Coordination Interventions:  No, not indicated    Tomasa Rand, RN, BSN, CEN [facility] [employer] [phone]

## 2022-09-23 ENCOUNTER — Ambulatory Visit (INDEPENDENT_AMBULATORY_CARE_PROVIDER_SITE_OTHER): Payer: Medicare HMO | Admitting: Physician Assistant

## 2022-09-23 ENCOUNTER — Encounter: Payer: Self-pay | Admitting: Physician Assistant

## 2022-09-23 ENCOUNTER — Other Ambulatory Visit: Payer: Self-pay | Admitting: Physician Assistant

## 2022-09-23 VITALS — BP 122/76 | HR 54 | Temp 96.8°F | Ht 60.0 in | Wt 97.6 lb

## 2022-09-23 DIAGNOSIS — R3589 Other polyuria: Secondary | ICD-10-CM | POA: Diagnosis not present

## 2022-09-23 DIAGNOSIS — Z23 Encounter for immunization: Secondary | ICD-10-CM | POA: Insufficient documentation

## 2022-09-23 DIAGNOSIS — Z794 Long term (current) use of insulin: Secondary | ICD-10-CM

## 2022-09-23 DIAGNOSIS — E782 Mixed hyperlipidemia: Secondary | ICD-10-CM

## 2022-09-23 DIAGNOSIS — E1165 Type 2 diabetes mellitus with hyperglycemia: Secondary | ICD-10-CM | POA: Diagnosis not present

## 2022-09-23 DIAGNOSIS — E785 Hyperlipidemia, unspecified: Secondary | ICD-10-CM | POA: Diagnosis not present

## 2022-09-23 DIAGNOSIS — K2100 Gastro-esophageal reflux disease with esophagitis, without bleeding: Secondary | ICD-10-CM

## 2022-09-23 DIAGNOSIS — E039 Hypothyroidism, unspecified: Secondary | ICD-10-CM

## 2022-09-23 DIAGNOSIS — E559 Vitamin D deficiency, unspecified: Secondary | ICD-10-CM | POA: Diagnosis not present

## 2022-09-23 DIAGNOSIS — I10 Essential (primary) hypertension: Secondary | ICD-10-CM | POA: Diagnosis not present

## 2022-09-23 LAB — POCT URINALYSIS DIP (CLINITEK)
Bilirubin, UA: NEGATIVE
Blood, UA: NEGATIVE
Glucose, UA: NEGATIVE mg/dL
Ketones, POC UA: NEGATIVE mg/dL
Leukocytes, UA: NEGATIVE
Nitrite, UA: NEGATIVE
POC PROTEIN,UA: 100 — AB
Spec Grav, UA: 1.01 (ref 1.010–1.025)
Urobilinogen, UA: 0.2 E.U./dL
pH, UA: 6.5 (ref 5.0–8.0)

## 2022-09-23 NOTE — Progress Notes (Signed)
Established Patient Office Visit  Subjective:  Patient ID: Nancy Lang, female    DOB: February 04, 1946  Age: 76 y.o. MRN: [id number]  CC:  Chief Complaint  Patient presents with   Diabetes    HPI Kaelani Kendrick presents for chronic follow up with her granddaughter who gives most of history  Mixed hyperlipidemia  Pt presents with hyperlipidemia. . Compliance with treatment has been good The patient is compliant with medications, maintains a low cholesterol diet , follows up as directed ,  The patient denies experiencing any hypercholesterolemia related symptoms.  She is currently taking lipitor 67m qd  Pt presents for follow up of hypertension.  The patient is tolerating the medication well without side effects. Compliance with treatment has been good; including taking medication as directed , maintains a healthy diet and regular exercise regimen , and following up as directed. She is  currently taking zestril 172mand lopressor 10069mhe also follows with cardiology for history of CAD and was placed on imdur 41m56mhe will be due for follow up with Dr KrasAgustin Creeementia: She is here for follow upt of cognitive problems. She is accompanied by  granddaughter.  Primary caregiver is her and pt's daughter. She states that her symptoms are stable at this time Functional Assessment:  Activities of Daily Living (ADLs):   She is independent in the following: feeding Requires assistance with the following: bathing and hygiene, toileting and dressing Currently on Namenda 10mg84mand aricept 10mg 16m-- she has actually not seen neurology for follow up in over a year - the family has not made a follow up appt with neurology yet  Hypothyroidism: Patient presents for follow up of hypothyroidism -  - she is currently on levothyroxine 50mcg 44mPt for follow up for diabetes -granddaughter states that overall she is feeling well - when they do take her glucose it has been averaging under 200 -  voices no problems or concerns - currently on lantus  Pt with history of GERD and IBS - pt is currently taking bentyl and protonix - she is due for follow up with Dr Butler Melina Copamily has not contacted office for follow up  Pt with history of low vitamin D - due for labwork  Pt complained of a few episodes of polyuria but no fever, malaise, hematuria  Pt would like to have flu shot today  Past Medical History:  Diagnosis Date   Acquired hypothyroidism 12/31/2015   Acute cystitis without hematuria 06/12/2020   Acute laryngopharyngitis 05/23/2020   Angina pectoris (HCC)   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, stenosis left P2   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, 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   Coronary artery disease involving native coronary artery of native heart 10/07/2016   Overview:  PTCA and drug-eluting stents to mid proximal portion to LAD and mid and proximal portion of circumflex in 2017 Drug-eluting stent mid LAD in May 2018   Dementia without behavioral disturbance (HCC) 5/[place]2018   Diabetes mellitus without complication (HCC)   [place] (high LDL; low HDL) 10/07/2016   Essential hypertension, benign 10/07/2016   GERD (gastroesophageal reflux disease)    Hyperlipidemia    Hypertension    Insulin dependent type 2 diabetes mellitus, controlled (HCC) 12[place]/2017   Malaise 01/20/2021   Memory impairment 03/10/2017   Mixed hyperlipidemia 12/06/2019   NSTEMI (non-ST elevated myocardial infarction) (HCC) 11[place]/2017  Thyroid disease    Vitamin D deficiency 03/08/2017    Past Surgical History:  Procedure Laterality Date   CARDIAC CATHETERIZATION      Family History  Problem Relation Age of Onset   Diabetes Mother    Cancer Mother    Heart disease Mother    Heart disease Father    Alcohol abuse Father     Social History   Socioeconomic History   Marital status: Widowed    Spouse name: Not on file   Number of  children: Not on file   Years of education: Not on file   Highest education level: Not on file  Occupational History   Occupation: retired  Tobacco Use   Smoking status: Never   Smokeless tobacco: Current    Types: Snuff   Tobacco comments:    one can a week  Vaping Use   Vaping Use: Never used  Substance and Sexual Activity   Alcohol use: No   Drug use: No   Sexual activity: Not Currently  Other Topics Concern   Not on file  Social History Narrative   Not on file   Social Determinants of Health   Financial Resource Strain: Not on file  Food Insecurity: Not on file  Transportation Needs: Not on file  Physical Activity: Not on file  Stress: Not on file  Social Connections: Not on file  Intimate Partner Violence: Not on file     Current Outpatient Medications:    Accu-Chek FastClix Lancets MISC, 1 each by Other route See admin instructions. Just Lancets Unknown size, Disp: , Rfl:    ACCU-CHEK GUIDE test strip, USE TO CHECK BLOOD GLUCOSE THREE TIMES DAILY, Disp: 300 strip, Rfl: 1   ascorbic acid (VITAMIN C) 500 MG tablet, Take 500 mg by mouth daily., Disp: , Rfl:    aspirin 81 MG chewable tablet, Chew 81 mg by mouth daily. , Disp: , Rfl:    atorvastatin (LIPITOR) 80 MG tablet, TAKE 1 TABLET EVERY DAY, Disp: 90 tablet, Rfl: 0   Calcium-Magnesium-Zinc (CAL-MAG-ZINC PO), Take 1 tablet by mouth daily with breakfast. Unknown strength per patient, Disp: , Rfl:    Cholecalciferol (VITAMIN D3) 1000 units CAPS, Take 1,000 Units by mouth daily. , Disp: , Rfl:    diclofenac Sodium (VOLTAREN) 1 % GEL, APPLY  2  GRAMS TO AFFECTED AREA(S) FOUR TIMES DAILY, Disp: 600 g, Rfl: 10   dicyclomine (BENTYL) 10 MG capsule, Take 1 capsule (10 mg total) by mouth 3 (three) times daily before meals., Disp: 270 capsule, Rfl: 1   donepezil (ARICEPT) 10 MG tablet, TAKE 1 TABLET AT BEDTIME, Disp: 90 tablet, Rfl: 1   DROPLET PEN NEEDLES 32G X 4 MM MISC, USE AS DIRECTED ONE TIME DAILY, Disp: 100 each, Rfl:  3   isosorbide mononitrate (IMDUR) 30 MG 24 hr tablet, Take 1 tablet (30 mg total) by mouth daily., Disp: 90 tablet, Rfl: 2   LANTUS SOLOSTAR 100 UNIT/ML Solostar Pen, INJECT 30 UNITS UNDER THE SKIN EVERY MORNING, Disp: 30 mL, Rfl: 2   levothyroxine (SYNTHROID) 50 MCG tablet, TAKE 1 TABLET EVERY DAY, Disp: 90 tablet, Rfl: 1   lisinopril (ZESTRIL) 10 MG tablet, TAKE 1 TABLET EVERY DAY, Disp: 90 tablet, Rfl: 0   loperamide (IMODIUM A-D) 2 MG tablet, Take 1 tablet (2 mg total) by mouth 2 (two) times daily as needed for diarrhea or loose stools., Disp: 60 tablet, Rfl: 5   memantine (NAMENDA) 10 MG tablet, TAKE 1 TABLET TWICE DAILY,  Disp: 180 tablet, Rfl: 10   metoprolol tartrate (LOPRESSOR) 100 MG tablet, TAKE 1 TABLET TWICE DAILY, Disp: 180 tablet, Rfl: 10   montelukast (SINGULAIR) 10 MG tablet, TAKE 1 TABLET EVERY DAY (Patient taking differently: Take 10 mg by mouth at bedtime.), Disp: 90 tablet, Rfl: 3   Multiple Vitamins-Minerals (ONE-A-DAY PROACTIVE 65+) TABS, Take 1 tablet by mouth daily with breakfast. Unknown strength per patient, Disp: , Rfl:    nitrofurantoin, macrocrystal-monohydrate, (MACROBID) 100 MG capsule, TAKE 1 CAPSULE EVERY DAY WITH BREAKFAST, Disp: 90 capsule, Rfl: 0   Omega-3 1000 MG CAPS, Take 1,000 mg by mouth daily. , Disp: , Rfl:    pantoprazole (PROTONIX) 40 MG tablet, TAKE 1 TABLET EVERY DAY, Disp: 90 tablet, Rfl: 1   potassium chloride SA (KLOR-CON M) 20 MEQ tablet, TAKE 1 TABLET(20 MEQ) BY MOUTH DAILY, Disp: 90 tablet, Rfl: 1   Allergies  Allergen Reactions   Pravastatin     ANGER AND MEMORY ISSUES   Ativan [Lorazepam] Anxiety and Other (See Comments)    Made the patient "paranoid and mean" (per family)- also worsened anxiety   Fenofibrate Rash   Oxytetracycline Rash   Penicillins Rash   Septra [Sulfamethoxazole-Trimethoprim] Rash   Sulfa Antibiotics Rash   Tetracyclines & Related Rash   PHYSICAL EXAM:   VS: BP 122/76 (BP Location: Left Arm, Patient Position:  Sitting, Cuff Size: Small)   Pulse (!) 54   Temp (!) 96.8 F (36 C) (Temporal)   Ht 5' (1.524 m)   Wt 97 lb 9.6 oz (44.3 kg)   SpO2 96%   BMI 19.06 kg/m   CONSTITUTIONAL: Negative for chills, fatigue, fever, unintentional weight gain and unintentional weight loss.  E/N/T: Negative for ear pain, nasal congestion and sore throat.  CARDIOVASCULAR: Negative for chest pain, dizziness, palpitations and pedal edema.  RESPIRATORY: Negative for recent cough and dyspnea.  GASTROINTESTINAL: Negative for abdominal pain, acid reflux symptoms, constipation, diarrhea, nausea and vomiting.  GU - see HPI MSK: Negative for arthralgias and myalgias.  INTEGUMENTARY: Negative for rash.  NEUROLOGICAL: Negative for dizziness and headaches.  PSYCHIATRIC: Negative for sleep disturbance and to question depression screen.  Negative for depression, negative for anhedonia.        Objective:  PHYSICAL EXAM:   VS: BP 122/76 (BP Location: Left Arm, Patient Position: Sitting, Cuff Size: Small)   Pulse (!) 54   Temp (!) 96.8 F (36 C) (Temporal)   Ht 5' (1.524 m)   Wt 97 lb 9.6 oz (44.3 kg)   SpO2 96%   BMI 19.06 kg/m   GEN: Well nourished, well developed, in no acute distress  Cardiac: RRR; no murmurs, rubs, or gallops,no edema -  Respiratory:  normal respiratory rate and pattern with no distress - normal breath sounds with no rales, rhonchi, wheezes or rubs MS: no deformity or atrophy  Skin: warm and dry, no rash   Psych: euthymic mood, appropriate affect and demeanor Office Visit on 09/23/2022  Component Date Value Ref Range Status   Color, UA 09/23/2022 yellow  yellow Final   Clarity, UA 09/23/2022 clear  clear Final   Glucose, UA 09/23/2022 negative  negative mg/dL Final   Bilirubin, UA 09/23/2022 negative  negative Final   Ketones, POC UA 09/23/2022 negative  negative mg/dL Final   Spec Grav, UA 09/23/2022 1.010  1.010 - 1.025 Final   Blood, UA 09/23/2022 negative  negative Final   pH, UA  09/23/2022 6.5  5.0 - 8.0 Final   POC PROTEIN,UA  09/23/2022 =100 (A)  negative, trace Final   Urobilinogen, UA 09/23/2022 0.2  0.2 or 1.0 E.U./dL Final   Nitrite, UA 09/23/2022 Negative  Negative Final   Leukocytes, UA 09/23/2022 Negative  Negative Final     Office Visit on 09/23/2022  Component Date Value Ref Range Status   Color, UA 09/23/2022 yellow  yellow Final   Clarity, UA 09/23/2022 clear  clear Final   Glucose, UA 09/23/2022 negative  negative mg/dL Final   Bilirubin, UA 09/23/2022 negative  negative Final   Ketones, POC UA 09/23/2022 negative  negative mg/dL Final   Spec Grav, UA 09/23/2022 1.010  1.010 - 1.025 Final   Blood, UA 09/23/2022 negative  negative Final   pH, UA 09/23/2022 6.5  5.0 - 8.0 Final   POC PROTEIN,UA 09/23/2022 =100 (A)  negative, trace Final   Urobilinogen, UA 09/23/2022 0.2  0.2 or 1.0 E.U./dL Final   Nitrite, UA 09/23/2022 Negative  Negative Final   Leukocytes, UA 09/23/2022 Negative  Negative Final      Health Maintenance Due  Topic Date Due   Medicare Annual Wellness (AWV)  Never done   Zoster Vaccines- Shingrix (1 of 2) Never done   MAMMOGRAM  07/31/2020   OPHTHALMOLOGY EXAM  05/13/2021    There are no preventive care reminders to display for this patient.  Lab Results  Component Value Date   TSH 1.920 06/17/2022   Lab Results  Component Value Date   WBC 9.4 06/17/2022   HGB 11.8 06/17/2022   HCT 35.2 06/17/2022   MCV 97 06/17/2022   PLT 315 06/17/2022   Lab Results  Component Value Date   NA 146 (H) 06/17/2022   K 3.7 06/17/2022   CO2 22 06/17/2022   GLUCOSE 98 06/17/2022   BUN 22 06/17/2022   CREATININE 0.89 06/17/2022   BILITOT 1.1 06/17/2022   ALKPHOS 97 06/17/2022   AST 24 06/17/2022   ALT 30 06/17/2022   PROT 6.2 06/17/2022   ALBUMIN 4.3 06/17/2022   CALCIUM 9.3 06/17/2022   EGFR 67 06/17/2022   Lab Results  Component Value Date   CHOL 124 06/17/2022   Lab Results  Component Value Date   HDL 43  06/17/2022   Lab Results  Component Value Date   LDLCALC 54 06/17/2022   Lab Results  Component Value Date   TRIG 160 (H) 06/17/2022   Lab Results  Component Value Date   CHOLHDL 2.9 06/17/2022   Lab Results  Component Value Date   HGBA1C 6.2 (H) 06/17/2022      Assessment & Plan:   Problem List Items Addressed This Visit       Cardiovascular and Mediastinum   Coronary artery disease involving native coronary artery of native heart Continue current meds and follow up with cardiology     Essential hypertension, benign - Primary   Relevant Orders   CBC with Differential/Platelet   Comprehensive metabolic panel Continue meds as directed     Digestive   GERD (gastroesophageal reflux disease)   Relevant Orders   Continue meds Follow up with GI - call for appt     Endocrine   Acquired hypothyroidism   Relevant Orders   TSH Continue synthroid   Insulin dependent type 2 diabetes mellitus, controlled ([place])   Relevant Orders   CBC with Differential/Platelet   Comprehensive metabolic panel   Lipid panel   Hemoglobin A1c Continue lantus as directed     Other   Hyperlipidemia Continue meds and watch diet  Vitamin D insufficiency   Relevant Orders   VITAMIN D 25 Hydroxy (Vit-D Deficiency, Fractures)     Dementia Continue meds Daughter to call and schedule follow up with neurologist  Need for flu shot Fluad given    No orders of the defined types were placed in this encounter.   Follow-up: Return in about 3 months (around 12/24/2022) for chronic fasting follow up.    SARA R Ercel Normoyle, PA-C

## 2022-09-24 LAB — CBC WITH DIFFERENTIAL/PLATELET
Basophils Absolute: 0.1 10*3/uL (ref 0.0–0.2)
Basos: 1 %
EOS (ABSOLUTE): 0.2 10*3/uL (ref 0.0–0.4)
Eos: 2 %
Hematocrit: 33.4 % — ABNORMAL LOW (ref 34.0–46.6)
Hemoglobin: 11.7 g/dL (ref 11.1–15.9)
Immature Grans (Abs): 0 10*3/uL (ref 0.0–0.1)
Immature Granulocytes: 0 %
Lymphocytes Absolute: 2.4 10*3/uL (ref 0.7–3.1)
Lymphs: 26 %
MCH: 33.4 pg — ABNORMAL HIGH (ref 26.6–33.0)
MCHC: 35 g/dL (ref 31.5–35.7)
MCV: 95 fL (ref 79–97)
Monocytes Absolute: 0.9 10*3/uL (ref 0.1–0.9)
Monocytes: 10 %
Neutrophils Absolute: 5.5 10*3/uL (ref 1.4–7.0)
Neutrophils: 61 %
Platelets: 320 10*3/uL (ref 150–450)
RBC: 3.5 x10E6/uL — ABNORMAL LOW (ref 3.77–5.28)
RDW: 12.7 % (ref 11.7–15.4)
WBC: 9.1 10*3/uL (ref 3.4–10.8)

## 2022-09-24 LAB — COMPREHENSIVE METABOLIC PANEL
ALT: 38 IU/L — ABNORMAL HIGH (ref 0–32)
AST: 27 IU/L (ref 0–40)
Albumin/Globulin Ratio: 2 (ref 1.2–2.2)
Albumin: 4.2 g/dL (ref 3.8–4.8)
Alkaline Phosphatase: 86 IU/L (ref 44–121)
BUN/Creatinine Ratio: 16 (ref 12–28)
BUN: 13 mg/dL (ref 8–27)
Bilirubin Total: 1.6 mg/dL — ABNORMAL HIGH (ref 0.0–1.2)
CO2: 25 mmol/L (ref 20–29)
Calcium: 10.5 mg/dL — ABNORMAL HIGH (ref 8.7–10.3)
Chloride: 104 mmol/L (ref 96–106)
Creatinine, Ser: 0.8 mg/dL (ref 0.57–1.00)
Globulin, Total: 2.1 g/dL (ref 1.5–4.5)
Glucose: 76 mg/dL (ref 70–99)
Potassium: 3.7 mmol/L (ref 3.5–5.2)
Sodium: 142 mmol/L (ref 134–144)
Total Protein: 6.3 g/dL (ref 6.0–8.5)
eGFR: 76 mL/min/{1.73_m2} (ref >59)

## 2022-09-24 LAB — LIPID PANEL
Chol/HDL Ratio: 3 ratio (ref 0.0–4.4)
Cholesterol, Total: 130 mg/dL (ref 100–199)
HDL: 44 mg/dL (ref >39)
LDL Chol Calc (NIH): 54 mg/dL (ref 0–99)
Triglycerides: 195 mg/dL — ABNORMAL HIGH (ref 0–149)
VLDL Cholesterol Cal: 32 mg/dL (ref 5–40)

## 2022-09-24 LAB — HEMOGLOBIN A1C
Est. average glucose Bld gHb Est-mCnc: 126 mg/dL
Hgb A1c MFr Bld: 6 % — ABNORMAL HIGH (ref 4.8–5.6)

## 2022-09-24 LAB — CARDIOVASCULAR RISK ASSESSMENT

## 2022-09-24 LAB — TSH: TSH: 2.38 u[IU]/mL (ref 0.450–4.500)

## 2022-09-24 LAB — VITAMIN D 25 HYDROXY (VIT D DEFICIENCY, FRACTURES): Vit D, 25-Hydroxy: 81.2 ng/mL (ref 30.0–100.0)

## 2022-10-02 ENCOUNTER — Other Ambulatory Visit: Payer: Self-pay | Admitting: Physician Assistant

## 2022-10-15 NOTE — Progress Notes (Signed)
Acute Office Visit  Subjective:    Patient ID: Nancy Lang, female    DOB: 14-Jan-1946, 76 y.o.   MRN: [id number]  Chief Complaint  Patient presents with   Urinary Tract Infection    HPI: Patient is in today for Urinary symptoms. She is accompanied by her adult grand daughter that is her caregiver. Pt has dementia.   She reports recurrent dysuria. The current episode started yesterday and is worsening. Patient states symptoms are 6/10 in intensity, occurring constantly. She  has been recently treated for similar symptoms. She is prescribed Macrobid daily for UTI prophylaxis.    Pt was noted to have two insects on her clothing. They were identified by staff CMA as bed bugs. Upon further questioning, grand daughter stated her mother had been trying to "get rid of them at home". States they will reach out to pest control. SDOH referral placed for social worker consult and possibly help with financial burden of insect infestation.    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   Coronary artery disease involving native coronary artery of native heart 10/07/2016   Overview:  PTCA and drug-eluting stents to mid proximal portion to LAD and mid and proximal portion of circumflex in 2017 Drug-eluting stent mid LAD in May 2018   Dementia without behavioral disturbance ([place]) 03/10/2017   Diabetes mellitus without complication (HCC)    Dyslipidemia (high LDL; low HDL) 10/07/2016   Essential hypertension, benign 10/07/2016   GERD (gastroesophageal reflux disease)    Hyperlipidemia    Hypertension    Insulin dependent type 2 diabetes mellitus, controlled ([place]) 10/07/2016   Malaise  01/20/2021   Memory impairment 03/10/2017   Mixed hyperlipidemia 12/06/2019   NSTEMI (non-ST elevated myocardial infarction) ([place]) 09/07/2016   Thyroid disease    Vitamin D deficiency 03/08/2017    Past Surgical History:  Procedure Laterality Date   CARDIAC CATHETERIZATION      Family History  Problem Relation Age of Onset   Diabetes Mother    Cancer Mother    Heart disease Mother    Heart disease Father    Alcohol abuse Father     Social History   Socioeconomic History   Marital status: Widowed    Spouse name: Not on file   Number of children: Not on file   Years of education: Not on file   Highest education level: Not on file  Occupational History   Occupation: retired  Tobacco Use   Smoking status: Never   Smokeless tobacco: Current    Types: Snuff   Tobacco comments:    one can a week  Vaping Use   Vaping Use: Never used  Substance and Sexual Activity   Alcohol use: No   Drug use: No   Sexual activity: Not Currently  Other Topics Concern   Not on file  Social History Narrative   Not on file   Social Determinants of Health   Financial Resource Strain: Not on file  Food Insecurity: Not on file  Transportation Needs: Not on file  Physical Activity: Not on file  Stress: Not on file  Social Connections: Not on file  Intimate Partner Violence: Not on  file    Outpatient Medications Prior to Visit  Medication Sig Dispense Refill   Accu-Chek FastClix Lancets MISC 1 each by Other route See admin instructions. Just Lancets Unknown size     ACCU-CHEK GUIDE test strip USE TO CHECK BLOOD GLUCOSE THREE TIMES DAILY 300 strip 1   ascorbic acid (VITAMIN C) 500 MG tablet Take 500 mg by mouth daily.     aspirin 81 MG chewable tablet Chew 81 mg by mouth daily.      atorvastatin (LIPITOR) 80 MG tablet TAKE 1 TABLET EVERY DAY 90 tablet 0   Calcium-Magnesium-Zinc (CAL-MAG-ZINC PO) Take 1 tablet by mouth daily with breakfast. Unknown strength per patient     Cholecalciferol  (VITAMIN D3) 1000 units CAPS Take 1,000 Units by mouth daily.      diclofenac Sodium (VOLTAREN) 1 % GEL APPLY  2  GRAMS TO AFFECTED AREA(S) FOUR TIMES DAILY 600 g 10   dicyclomine (BENTYL) 10 MG capsule Take 1 capsule (10 mg total) by mouth 3 (three) times daily before meals. 270 capsule 1   donepezil (ARICEPT) 10 MG tablet TAKE 1 TABLET AT BEDTIME 90 tablet 1   DROPLET PEN NEEDLES 32G X 4 MM MISC USE AS DIRECTED ONE TIME DAILY 100 each 3   isosorbide mononitrate (IMDUR) 30 MG 24 hr tablet Take 1 tablet (30 mg total) by mouth daily. 90 tablet 2   LANTUS SOLOSTAR 100 UNIT/ML Solostar Pen INJECT 30 UNITS UNDER THE SKIN EVERY MORNING 30 mL 2   levothyroxine (SYNTHROID) 50 MCG tablet TAKE 1 TABLET EVERY DAY 90 tablet 1   lisinopril (ZESTRIL) 10 MG tablet TAKE 1 TABLET EVERY DAY 90 tablet 0   loperamide (IMODIUM A-D) 2 MG tablet Take 1 tablet (2 mg total) by mouth 2 (two) times daily as needed for diarrhea or loose stools. 60 tablet 5   memantine (NAMENDA) 10 MG tablet TAKE 1 TABLET TWICE DAILY 180 tablet 10   metoprolol tartrate (LOPRESSOR) 100 MG tablet TAKE 1 TABLET TWICE DAILY 180 tablet 10   montelukast (SINGULAIR) 10 MG tablet TAKE 1 TABLET EVERY DAY 90 tablet 3   Multiple Vitamins-Minerals (ONE-A-DAY PROACTIVE 65+) TABS Take 1 tablet by mouth daily with breakfast. Unknown strength per patient     nitrofurantoin, macrocrystal-monohydrate, (MACROBID) 100 MG capsule TAKE 1 CAPSULE EVERY DAY WITH BREAKFAST 90 capsule 3   Omega-3 1000 MG CAPS Take 1,000 mg by mouth daily.      pantoprazole (PROTONIX) 40 MG tablet TAKE 1 TABLET EVERY DAY 90 tablet 1   potassium chloride SA (KLOR-CON M) 20 MEQ tablet TAKE 1 TABLET(20 MEQ) BY MOUTH DAILY 90 tablet 1   No facility-administered medications prior to visit.    Allergies  Allergen Reactions   Pravastatin     ANGER AND MEMORY ISSUES   Ativan [Lorazepam] Anxiety and Other (See Comments)    Made the patient "paranoid and mean" (per family)- also  worsened anxiety   Fenofibrate Rash   Oxytetracycline Rash   Penicillins Rash   Septra [Sulfamethoxazole-Trimethoprim] Rash   Sulfa Antibiotics Rash   Tetracyclines & Related Rash    Review of Systems  Constitutional:  Negative for fatigue.  HENT:  Negative for congestion, ear pain and sore throat.   Respiratory:  Negative for cough and shortness of breath.   Cardiovascular:  Negative for chest pain.  Gastrointestinal:  Negative for abdominal pain, constipation, diarrhea, nausea and vomiting.  Genitourinary:  Positive for dysuria. Negative for frequency and urgency.  Musculoskeletal:  Negative  for arthralgias, back pain and myalgias.  Neurological:  Negative for dizziness and headaches.  Psychiatric/Behavioral:  Negative for agitation and sleep disturbance. The patient is not nervous/anxious.        Objective:    Physical Exam Vitals reviewed.  Constitutional:      Appearance: Normal appearance.     Comments: Bed bugs noted on clothing on left arm and right leg  Abdominal:     Tenderness: There is no abdominal tenderness. There is no right CVA tenderness or left CVA tenderness.  Skin:    Capillary Refill: Capillary refill takes less than 2 seconds.  Neurological:     Mental Status: She is alert. Mental status is at baseline.     BP 122/80 (BP Location: Left Arm, Patient Position: Sitting, Cuff Size: Small)   Pulse 60   Temp (!) 97.3 F (36.3 C) (Temporal)   Ht 5' (1.524 m)   Wt 100 lb (45.4 kg)   SpO2 96%   BMI 19.53 kg/m   Wt Readings from Last 3 Encounters:  10/16/22 100 lb (45.4 kg)  09/23/22 97 lb 9.6 oz (44.3 kg)  06/17/22 98 lb 3.2 oz (44.5 kg)    Health Maintenance Due  Topic Date Due   Medicare Annual Wellness (AWV)  Never done   Zoster Vaccines- Shingrix (1 of 2) Never done   MAMMOGRAM  07/31/2020   DTaP/Tdap/Td (2 - Td or Tdap) 04/25/2021   OPHTHALMOLOGY EXAM  05/13/2021       Lab Results  Component Value Date   TSH 2.380 09/23/2022   Lab  Results  Component Value Date   WBC 9.1 09/23/2022   HGB 11.7 09/23/2022   HCT 33.4 (L) 09/23/2022   MCV 95 09/23/2022   PLT 320 09/23/2022   Lab Results  Component Value Date   NA 142 09/23/2022   K 3.7 09/23/2022   CO2 25 09/23/2022   GLUCOSE 76 09/23/2022   BUN 13 09/23/2022   CREATININE 0.80 09/23/2022   BILITOT 1.6 (H) 09/23/2022   ALKPHOS 86 09/23/2022   AST 27 09/23/2022   ALT 38 (H) 09/23/2022   PROT 6.3 09/23/2022   ALBUMIN 4.2 09/23/2022   CALCIUM 10.5 (H) 09/23/2022   EGFR 76 09/23/2022   Lab Results  Component Value Date   CHOL 130 09/23/2022   Lab Results  Component Value Date   HDL 44 09/23/2022   Lab Results  Component Value Date   LDLCALC 54 09/23/2022   Lab Results  Component Value Date   TRIG 195 (H) 09/23/2022   Lab Results  Component Value Date   CHOLHDL 3.0 09/23/2022   Lab Results  Component Value Date   HGBA1C 6.0 (H) 09/23/2022       Assessment & Plan:   1. Infestation by bed bug - AMB Referral to [place] ([facility] Patients) -social worker consult for assessment of insect infestation   2. Candidiasis - fluconazole (DIFLUCAN) 150 MG tablet; Take 1 tablet (150 mg total) by mouth every 3 (three) days.  Dispense: 2 tablet; Refill: 0  3. Dysuria - POCT URINALYSIS DIP (CLINITEK)    Take Diflucan 150 mg once, then repeat in 3 days [facility] will contact you for consult    Follow-up: PRN  An After Visit Summary was printed and given to the patient.  I, Rip Harbour, NP, have reviewed all documentation for this visit. The documentation on 10/16/22 for the exam, diagnosis, procedures, and orders are all accurate and complete.  Signed, Rip Harbour, NP [place] [phone]

## 2022-10-16 ENCOUNTER — Telehealth: Payer: Self-pay | Admitting: *Deleted

## 2022-10-16 ENCOUNTER — Encounter: Payer: Self-pay | Admitting: Nurse Practitioner

## 2022-10-16 ENCOUNTER — Ambulatory Visit (INDEPENDENT_AMBULATORY_CARE_PROVIDER_SITE_OTHER): Payer: Medicare HMO | Admitting: Nurse Practitioner

## 2022-10-16 VITALS — BP 122/80 | HR 60 | Temp 97.3°F | Ht 60.0 in | Wt 100.0 lb

## 2022-10-16 DIAGNOSIS — R300 Dysuria: Secondary | ICD-10-CM

## 2022-10-16 DIAGNOSIS — B888 Other specified infestations: Secondary | ICD-10-CM

## 2022-10-16 DIAGNOSIS — B379 Candidiasis, unspecified: Secondary | ICD-10-CM

## 2022-10-16 LAB — POCT URINALYSIS DIP (CLINITEK)
Bilirubin, UA: NEGATIVE
Blood, UA: NEGATIVE
Glucose, UA: >=1000 mg/dL — AB
Ketones, POC UA: NEGATIVE mg/dL
Leukocytes, UA: NEGATIVE
Nitrite, UA: NEGATIVE
POC PROTEIN,UA: 100 — AB
Spec Grav, UA: 1.015 (ref 1.010–1.025)
Urobilinogen, UA: 0.2 E.U./dL
pH, UA: 6 (ref 5.0–8.0)

## 2022-10-16 MED ORDER — FLUCONAZOLE 150 MG PO TABS: 150.0000 mg | ORAL_TABLET | ORAL | 0 refills | Status: DC

## 2022-10-16 NOTE — Patient Instructions (Signed)
Take Diflucan 150 mg once, then repeat in 3 days [facility] will contact you for consult    Vaginal Yeast Infection, Adult  Vaginal yeast infection is a condition that causes vaginal discharge as well as soreness, swelling, and redness (inflammation) of the vagina. This is a common condition. Some women get this infection frequently. What are the causes? This condition is caused by a change in the normal balance of the yeast (Candida) and normal bacteria that live in the vagina. This change causes an overgrowth of yeast, which causes the inflammation. What increases the risk? The condition is more likely to develop in women who: Take antibiotic medicines. Have diabetes. Take birth control pills. Are pregnant. Douche often. Have a weak body defense system (immune system). Have been taking steroid medicines for a long time. Frequently wear tight clothing. What are the signs or symptoms? Symptoms of this condition include: White, thick, creamy vaginal discharge. Swelling, itching, redness, and irritation of the vagina. The lips of the vagina (labia) may be affected as well. Pain or a burning feeling while urinating. Pain during sex. How is this diagnosed? This condition is diagnosed based on: Your medical history. A physical exam. A pelvic exam. Your health care provider will examine a sample of your vaginal discharge under a microscope. Your health care provider may send this sample for testing to confirm the diagnosis. How is this treated? This condition is treated with medicine. Medicines may be over-the-counter or prescription. You may be told to use one or more of the following: Medicine that is taken by mouth (orally). Medicine that is applied as a cream (topically). Medicine that is inserted directly into the vagina (suppository). Follow these instructions at home: Take or apply over-the-counter and prescription medicines only as told by your health care provider. Do not use  tampons until your health care provider approves. Do not have sex until your infection has cleared. Sex can prolong or worsen your symptoms of infection. Ask your health care provider when it is safe to resume sexual activity. Keep all follow-up visits. This is important. How is this prevented?  Do not wear tight clothes, such as pantyhose or tight pants. Wear breathable cotton underwear. Do not use douches, perfumed soap, creams, or powders. Wipe from front to back after using the toilet. If you have diabetes, keep your blood sugar levels under control. Ask your health care provider for other ways to prevent yeast infections. Contact a health care provider if: You have a fever. Your symptoms go away and then return. Your symptoms do not get better with treatment. Your symptoms get worse. You have new symptoms. You develop blisters in or around your vagina. You have blood coming from your vagina and it is not your menstrual period. You develop pain in your abdomen. Summary Vaginal yeast infection is a condition that causes discharge as well as soreness, swelling, and redness (inflammation) of the vagina. This condition is treated with medicine. Medicines may be over-the-counter or prescription. Take or apply over-the-counter and prescription medicines only as told by your health care provider. Do not douche. Resume sexual activity or use of tampons as instructed by your health care provider. Contact a health care provider if your symptoms do not get better with treatment or your symptoms go away and then return. This information is not intended to replace advice given to you by your health care provider. Make sure you discuss any questions you have with your health care provider. Document Revised: 12/30/2020 Document Reviewed: 12/30/2020  Elsevier Patient Education  2023 Elsevier Inc.  

## 2022-10-16 NOTE — Progress Notes (Signed)
  Care Coordination  Outreach Note  10/16/2022 Name: Nancy Lang MRN: [id number] DOB: 1945/12/24   Care Coordination Outreach Attempts: An unsuccessful telephone outreach was attempted today to offer the patient information about available care coordination services as a benefit of their health plan.   Received referral   Follow Up Plan:  Additional outreach attempts will be made to offer the patient care coordination information and services.   Encounter Outcome:  No Answer  Julian Hy, [place] Direct Dial: [phone]

## 2022-10-22 NOTE — Progress Notes (Signed)
  Care Coordination  Outreach Note  10/22/2022 Name: Nancy Lang MRN: [id number] DOB: 1946/08/16   Care Coordination Outreach Attempts: A second unsuccessful outreach was attempted today to offer the patient with information about available care coordination services as a benefit of their health plan.     Referral received   Follow Up Plan:  Additional outreach attempts will be made to offer the patient care coordination information and services.   Encounter Outcome:  No Answer  Julian Hy, [place] Direct Dial: [phone]

## 2022-10-24 ENCOUNTER — Other Ambulatory Visit: Payer: Self-pay | Admitting: Family Medicine

## 2022-10-24 DIAGNOSIS — I10 Essential (primary) hypertension: Secondary | ICD-10-CM

## 2022-10-28 NOTE — Progress Notes (Signed)
  Care Coordination  Outreach Note  10/28/2022 Name: Alithia Zavaleta MRN: [id number] DOB: August 13, 1946   Care Coordination Outreach Attempts: A third unsuccessful outreach was attempted today to offer the patient with information about available care coordination services as a benefit of their health plan.   Follow Up Plan:  No further outreach attempts will be made at this time. We have been unable to contact the patient to offer or enroll patient in care coordination services  Encounter Outcome:  No Answer  Julian Hy, [place] Direct Dial: [phone]

## 2022-10-28 NOTE — Progress Notes (Signed)
  Care Coordination   Note   10/28/2022 Name: [name] MRN: [id number] DOB: [birth year]  Nancy Lang is a 77 y.o. year old female who sees Marge Duncans, [place] for primary care. I reached out to [name] by phone today to offer care coordination services.  [name] was given information about Care Coordination services today including:   The Care Coordination services include support from the care team which includes your Nurse Coordinator, Clinical Social Worker, or Pharmacist.  The Care Coordination team is here to help remove barriers to the health concerns and goals most important to you. Care Coordination services are voluntary, and the patient may decline or stop services at any time by request to their care team member.   Care Coordination Consent Status: Patient agreed to services and verbal consent obtained.   Follow up plan:  Telephone appointment with care coordination team member scheduled for:  11/03/2022  Encounter Outcome:  Pt. Scheduled from referral   Julian Hy, [place] Direct Dial: [phone]

## 2022-11-03 ENCOUNTER — Ambulatory Visit: Payer: Self-pay

## 2022-11-03 NOTE — Patient Outreach (Signed)
  Care Coordination   Initial Visit Note   11/03/2022 Name: Jaelee Laughter MRN: [id number] DOB: 1946/10/01  Tishara Pizano is a 77 y.o. year old female who sees Marge Duncans, [place] for primary care. I  spoke with patients grand-daughter Aldona Bar by phone.  What matters to the patients health and wellness today?  Eradication of pests in the home.    Goals Addressed             This Visit's Progress    Care Coordination Activities       Care Coordination Interventions: Received referral to assist with pest resources after two bugs (presumed bed bugs) were seen on patients clothes during an office visit Contacted patients grand-daughter Aldona Bar who indicates the family recently went to the beach for a three day vacation and believes they brought bed bugs home Discussed Aldona Bar has been in contact with [employer] and is awaiting a home inspection to confirm pest type. Once inspection is completed, she will know next steps SW to follow up on 1/30 to assess outcome of [employer] treatment          SDOH assessments and interventions completed:  No     Care Coordination Interventions:  Yes, provided   Follow up plan: Follow up call scheduled for 11/24/22    Encounter Outcome:  Pt. Visit Completed   Daneen Schick, Arita Miss, CDP Social Worker, Certified Dementia Practitioner [facility]  Care Coordination [phone]

## 2022-11-03 NOTE — Patient Instructions (Signed)
Visit Information  Thank you for taking time to visit with me today. Please don't hesitate to contact me if I can be of assistance to you.   Following are the goals we discussed today:   Goals Addressed             This Visit's Progress    Care Coordination Activities       Care Coordination Interventions: Received referral to assist with pest resources after two bugs (presumed bed bugs) were seen on patients clothes during an office visit Contacted patients grand-daughter Aldona Bar who indicates the family recently went to the beach for a three day vacation and believes they brought bed bugs home Discussed Aldona Bar has been in contact with [employer] and is awaiting a home inspection to confirm pest type. Once inspection is completed, she will know next steps SW to follow up on 1/30 to assess outcome of [employer] treatment          Our next appointment is by telephone on 11/24/22 at 11:30 am  Please call the care guide team at [phone] if you need to cancel or reschedule your appointment.   If you are experiencing a Mental Health or [place] or need someone to talk to, please call [phone]  Patient verbalizes understanding of instructions and care plan provided today and agrees to view in [place]. Active MyChart status and patient understanding of how to access instructions and care plan via MyChart confirmed with patient.     Telephone follow up appointment with care management team member scheduled for:11/24/22  Daneen Schick, Arita Miss, CDP Social Worker, Certified Dementia Practitioner [facility]  Care Coordination [phone]

## 2022-11-05 ENCOUNTER — Other Ambulatory Visit: Payer: Self-pay | Admitting: Physician Assistant

## 2022-11-05 DIAGNOSIS — E876 Hypokalemia: Secondary | ICD-10-CM

## 2022-11-08 ENCOUNTER — Telehealth: Payer: Self-pay | Admitting: Family Medicine

## 2022-11-08 NOTE — Telephone Encounter (Signed)
Scheduled Monday am for nasal congestion, sore throat x 1-2 days. no fever. possible exposure to flu. patient arriving by 8:20. Sugar low 49 Sunday morning. I decreased insulin from 30 to 20 U daily. Had not eaten well in the morning and slept in prior to low sugar. Patient had eaten and recheck was 120.

## 2022-11-09 ENCOUNTER — Encounter: Payer: Self-pay | Admitting: Physician Assistant

## 2022-11-09 ENCOUNTER — Ambulatory Visit (INDEPENDENT_AMBULATORY_CARE_PROVIDER_SITE_OTHER): Payer: Medicare HMO | Admitting: Physician Assistant

## 2022-11-09 VITALS — BP 124/82 | HR 64 | Temp 97.0°F | Ht 59.0 in | Wt 100.2 lb

## 2022-11-09 DIAGNOSIS — J069 Acute upper respiratory infection, unspecified: Secondary | ICD-10-CM | POA: Diagnosis not present

## 2022-11-09 DIAGNOSIS — J060 Acute laryngopharyngitis: Secondary | ICD-10-CM | POA: Diagnosis not present

## 2022-11-09 LAB — POCT RAPID STREP A: Strep A Ag: NOT DETECTED

## 2022-11-09 LAB — POC COVID19 BINAXNOW: SARS Coronavirus 2 Ag: NEGATIVE — AB

## 2022-11-09 LAB — POCT INFLUENZA A/B
Influenza A, POC: NEGATIVE
Influenza B, POC: NEGATIVE

## 2022-11-09 MED ORDER — AZITHROMYCIN 250 MG PO TABS: ORAL_TABLET | ORAL | 0 refills | Status: AC

## 2022-11-09 NOTE — Progress Notes (Signed)
Acute Office Visit  Subjective:    Patient ID: Nancy Lang, female    DOB: 1946/05/04, 77 y.o.   MRN: [id number]  Chief Complaint  Patient presents with   Cough   Sore Throat   Nasal Congestion    HPI: Patient is in today for complaints of cough, cold and congestion since Saturday.  She has had mild sore throat as well.  Denies fever, malaise, nausea or vomiting.  No changes in appetite Her glucose was running low over the weekend and was advised to decrease her insulin.  Fasting today at 126 - told to continue current dosing and monitor glucose. 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   Coronary artery disease involving native coronary artery of native heart 10/07/2016   Overview:  PTCA and drug-eluting stents to mid proximal portion to LAD and mid and proximal portion of circumflex in 2017 Drug-eluting stent mid LAD in May 2018   Dementia without behavioral disturbance ([place]) 03/10/2017   Diabetes mellitus without complication (HCC)    Dyslipidemia (high LDL; low HDL) 10/07/2016   Essential hypertension, benign 10/07/2016   GERD (gastroesophageal reflux disease)    Hyperlipidemia    Hypertension    Insulin dependent type 2 diabetes mellitus, controlled ([place]) 10/07/2016   Malaise 01/20/2021   Memory impairment 03/10/2017   Mixed hyperlipidemia 12/06/2019   NSTEMI (non-ST elevated myocardial infarction) ([place]) 09/07/2016   Thyroid disease    Vitamin D deficiency 03/08/2017    Past Surgical History:  Procedure Laterality Date   CARDIAC CATHETERIZATION      Family History  Problem  Relation Age of Onset   Diabetes Mother    Cancer Mother    Heart disease Mother    Heart disease Father    Alcohol abuse Father     Social History   Socioeconomic History   Marital status: Widowed    Spouse name: Not on file   Number of children: Not on file   Years of education: Not on file   Highest education level: Not on file  Occupational History   Occupation: retired  Tobacco Use   Smoking status: Never   Smokeless tobacco: Current    Types: Snuff   Tobacco comments:    one can a week  Vaping Use   Vaping Use: Never used  Substance and Sexual Activity   Alcohol use: No   Drug use: No   Sexual activity: Not Currently  Other Topics Concern   Not on file  Social History Narrative   Not on file   Social Determinants of Health   Financial Resource Strain: Not on file  Food Insecurity: Not on file  Transportation Needs: Not on file  Physical Activity: Not on file  Stress: Not on file  Social Connections: Not on file  Intimate Partner Violence: Not on file    Outpatient Medications Prior to Visit  Medication Sig Dispense Refill   Accu-Chek [employer] Lancets MISC 1 each by Other route See admin instructions. Just Lancets Unknown size     ACCU-CHEK GUIDE test strip USE TO CHECK BLOOD GLUCOSE THREE TIMES DAILY  300 strip 1   ascorbic acid (VITAMIN C) 500 MG tablet Take 500 mg by mouth daily.     aspirin 81 MG chewable tablet Chew 81 mg by mouth daily.      atorvastatin (LIPITOR) 80 MG tablet TAKE 1 TABLET EVERY DAY 90 tablet 0   Calcium-Magnesium-Zinc (CAL-MAG-ZINC PO) Take 1 tablet by mouth daily with breakfast. Unknown strength per patient     Cholecalciferol (VITAMIN D3) 1000 units CAPS Take 1,000 Units by mouth daily.      diclofenac Sodium (VOLTAREN) 1 % GEL APPLY  2  GRAMS TO AFFECTED AREA(S) FOUR TIMES DAILY 600 g 10   dicyclomine (BENTYL) 10 MG capsule Take 1 capsule (10 mg total) by mouth 3 (three) times daily before meals. 270 capsule 1   donepezil  (ARICEPT) 10 MG tablet TAKE 1 TABLET AT BEDTIME 90 tablet 1   DROPLET PEN NEEDLES 32G X 4 MM MISC USE AS DIRECTED ONE TIME DAILY 100 each 3   isosorbide mononitrate (IMDUR) 30 MG 24 hr tablet Take 1 tablet (30 mg total) by mouth daily. 90 tablet 2   LANTUS SOLOSTAR 100 UNIT/ML Solostar Pen INJECT 30 UNITS UNDER THE SKIN EVERY MORNING 30 mL 2   levothyroxine (SYNTHROID) 50 MCG tablet TAKE 1 TABLET EVERY DAY 90 tablet 1   lisinopril (ZESTRIL) 10 MG tablet TAKE 1 TABLET EVERY DAY 90 tablet 1   loperamide (IMODIUM A-D) 2 MG tablet Take 1 tablet (2 mg total) by mouth 2 (two) times daily as needed for diarrhea or loose stools. 60 tablet 5   memantine (NAMENDA) 10 MG tablet TAKE 1 TABLET TWICE DAILY 180 tablet 10   metoprolol tartrate (LOPRESSOR) 100 MG tablet TAKE 1 TABLET TWICE DAILY 180 tablet 10   montelukast (SINGULAIR) 10 MG tablet TAKE 1 TABLET EVERY DAY 90 tablet 3   Multiple Vitamins-Minerals (ONE-A-DAY PROACTIVE 65+) TABS Take 1 tablet by mouth daily with breakfast. Unknown strength per patient     nitrofurantoin, macrocrystal-monohydrate, (MACROBID) 100 MG capsule TAKE 1 CAPSULE EVERY DAY WITH BREAKFAST 90 capsule 3   Omega-3 1000 MG CAPS Take 1,000 mg by mouth daily.      pantoprazole (PROTONIX) 40 MG tablet TAKE 1 TABLET EVERY DAY 90 tablet 1   potassium chloride SA (KLOR-CON M) 20 MEQ tablet TAKE 1 TABLET EVERY DAY 90 tablet 3   fluconazole (DIFLUCAN) 150 MG tablet Take 1 tablet (150 mg total) by mouth every 3 (three) days. 2 tablet 0   No facility-administered medications prior to visit.    Allergies  Allergen Reactions   Pravastatin     ANGER AND MEMORY ISSUES   Ativan [Lorazepam] Anxiety and Other (See Comments)    Made the patient "paranoid and mean" (per family)- also worsened anxiety   Fenofibrate Rash   Oxytetracycline Rash   Penicillins Rash   Septra [Sulfamethoxazole-Trimethoprim] Rash   Sulfa Antibiotics Rash   Tetracyclines & Related Rash    Review of  Systems CONSTITUTIONAL: see HPI E/N/T:see HPI CARDIOVASCULAR: Negative for chest pain, dizziness, RESPIRATORY: see HPI GASTROINTESTINAL: Negative for abdominal pain,  constipation, diarrhea, nausea and vomiting.  INTEGUMENTARY: Negative for rash.          Objective:  PHYSICAL EXAM:   VS: BP 124/82 (BP Location: Left Arm, Patient Position: Sitting, Cuff Size: Small)   Pulse 64   Temp (!) 97 F (36.1 C) (Temporal)   Ht '4\' 11"'$  (1.499 m)   Wt 100 lb 3.2 oz (45.5 kg)   SpO2 98%  BMI 20.24 kg/m   GEN: Well nourished, well developed, in no acute distress  HEENT: normal external ears and nose - normal external auditory canals and TMS -- Lips, Teeth and Gums - normal  Oropharynx - normal mucosa, palate, and posterior pharynx Cardiac: RRR; no murmurs, Respiratory:  faint rhonchi - clears with cough  Skin: warm and dry, no rash   Office Visit on 11/09/2022  Component Date Value Ref Range Status   SARS Coronavirus 2 Ag 11/09/2022 Negative (A)  Negative Final   Influenza A, POC 11/09/2022 Negative  Negative Final   Influenza B, POC 11/09/2022 Negative  Negative Final   Strep A Ag 11/09/2022 None Detected  None Detected Final      Health Maintenance Due  Topic Date Due   Medicare Annual Wellness (AWV)  Never done   Zoster Vaccines- Shingrix (1 of 2) Never done   MAMMOGRAM  07/31/2020   DTaP/Tdap/Td (2 - Td or Tdap) 04/25/2021   OPHTHALMOLOGY EXAM  05/13/2021    There are no preventive care reminders to display for this patient.   Lab Results  Component Value Date   TSH 2.380 09/23/2022   Lab Results  Component Value Date   WBC 9.1 09/23/2022   HGB 11.7 09/23/2022   HCT 33.4 (L) 09/23/2022   MCV 95 09/23/2022   PLT 320 09/23/2022   Lab Results  Component Value Date   NA 142 09/23/2022   K 3.7 09/23/2022   CO2 25 09/23/2022   GLUCOSE 76 09/23/2022   BUN 13 09/23/2022   CREATININE 0.80 09/23/2022   BILITOT 1.6 (H) 09/23/2022   ALKPHOS 86 09/23/2022   AST 27  09/23/2022   ALT 38 (H) 09/23/2022   PROT 6.3 09/23/2022   ALBUMIN 4.2 09/23/2022   CALCIUM 10.5 (H) 09/23/2022   EGFR 76 09/23/2022   Lab Results  Component Value Date   CHOL 130 09/23/2022   Lab Results  Component Value Date   HDL 44 09/23/2022   Lab Results  Component Value Date   LDLCALC 54 09/23/2022   Lab Results  Component Value Date   TRIG 195 (H) 09/23/2022   Lab Results  Component Value Date   CHOLHDL 3.0 09/23/2022   Lab Results  Component Value Date   HGBA1C 6.0 (H) 09/23/2022       Assessment & Plan:  Iridessa was seen today for cough, sore throat and nasal congestion.  Acute upper respiratory infection -     POC COVID-19 BinaxNow -     POCT Influenza A/B -     POCT Rapid Strep A -     Azithromycin; Take 2 tablets on day 1, then 1 tablet daily on days 2 through 5  Dispense: 6 tablet; Refill: 0  Acute laryngopharyngitis Continue otc cough medication   Meds ordered this encounter  Medications   azithromycin (ZITHROMAX) 250 MG tablet    Sig: Take 2 tablets on day 1, then 1 tablet daily on days 2 through 5    Dispense:  6 tablet    Refill:  0    Order Specific Question:   Supervising Provider    AnswerShelton Silvas    Orders Placed This Encounter  Procedures   POC COVID-19 BinaxNow   POCT Influenza A/B     Follow-up: Return if symptoms worsen or fail to improve.  An After Visit Summary was printed and given to the patient.  Yetta Flock [facility] [phone]

## 2022-11-24 ENCOUNTER — Telehealth: Payer: Self-pay

## 2022-11-24 NOTE — Patient Outreach (Signed)
  Care Coordination   11/24/2022 Name: Nancy Lang MRN: [id number] DOB: 03-03-1946   Care Coordination Outreach Attempts:  An unsuccessful telephone outreach was attempted for a scheduled appointment today.  Follow Up Plan:  Additional outreach attempts will be made to offer the patient care coordination information and services.   Encounter Outcome:  No Answer   Care Coordination Interventions:  No, not indicated    Daneen Schick, BSW, CDP Social Worker, Certified Dementia Practitioner [facility]  Care Coordination [phone]

## 2022-11-30 ENCOUNTER — Telehealth: Payer: Self-pay

## 2022-11-30 NOTE — Patient Outreach (Signed)
  Care Coordination   11/30/2022 Name: Nancy Lang MRN: [id number] DOB: 1946-10-06   Care Coordination Outreach Attempts:  A second unsuccessful outreach was attempted today to offer the patient with information about available care coordination services as a benefit of their health plan.     Follow Up Plan:  Additional outreach attempts will be made to offer the patient care coordination information and services.   Encounter Outcome:  No Answer   Care Coordination Interventions:  No, not indicated    Daneen Schick, BSW, CDP Social Worker, Certified Dementia Practitioner [facility]  Care Coordination [phone]

## 2022-12-02 ENCOUNTER — Other Ambulatory Visit: Payer: Self-pay | Admitting: Physician Assistant

## 2022-12-02 DIAGNOSIS — E782 Mixed hyperlipidemia: Secondary | ICD-10-CM

## 2022-12-03 ENCOUNTER — Telehealth: Payer: Self-pay

## 2022-12-03 NOTE — Patient Outreach (Signed)
  Care Coordination   12/03/2022 Name: Nancy Lang MRN: [id number] DOB: 1946/02/07   Care Coordination Outreach Attempts:  A third unsuccessful outreach was attempted today to offer the patient with information about available care coordination services as a benefit of their health plan.   Follow Up Plan:  No further outreach attempts will be made at this time. We have been unable to contact the patient to offer or enroll patient in care coordination services  Encounter Outcome:  No Answer   Care Coordination Interventions:  No, not indicated    Daneen Schick, BSW, CDP Social Worker, Certified Dementia Practitioner [facility]  Care Coordination [phone]

## 2022-12-11 DIAGNOSIS — R1011 Right upper quadrant pain: Secondary | ICD-10-CM | POA: Diagnosis not present

## 2022-12-11 DIAGNOSIS — Z955 Presence of coronary angioplasty implant and graft: Secondary | ICD-10-CM | POA: Diagnosis not present

## 2022-12-11 DIAGNOSIS — I249 Acute ischemic heart disease, unspecified: Secondary | ICD-10-CM | POA: Diagnosis not present

## 2022-12-11 DIAGNOSIS — I2510 Atherosclerotic heart disease of native coronary artery without angina pectoris: Secondary | ICD-10-CM | POA: Diagnosis not present

## 2022-12-11 DIAGNOSIS — R079 Chest pain, unspecified: Secondary | ICD-10-CM | POA: Diagnosis not present

## 2022-12-11 DIAGNOSIS — Z7982 Long term (current) use of aspirin: Secondary | ICD-10-CM | POA: Diagnosis not present

## 2022-12-11 DIAGNOSIS — R7401 Elevation of levels of liver transaminase levels: Secondary | ICD-10-CM | POA: Diagnosis not present

## 2022-12-11 DIAGNOSIS — I252 Old myocardial infarction: Secondary | ICD-10-CM | POA: Diagnosis not present

## 2022-12-11 DIAGNOSIS — Z9049 Acquired absence of other specified parts of digestive tract: Secondary | ICD-10-CM | POA: Diagnosis not present

## 2022-12-11 DIAGNOSIS — I160 Hypertensive urgency: Secondary | ICD-10-CM | POA: Diagnosis not present

## 2022-12-11 DIAGNOSIS — F0390 Unspecified dementia without behavioral disturbance: Secondary | ICD-10-CM | POA: Diagnosis not present

## 2022-12-11 DIAGNOSIS — R9431 Abnormal electrocardiogram [ECG] [EKG]: Secondary | ICD-10-CM | POA: Diagnosis not present

## 2022-12-11 DIAGNOSIS — Z79899 Other long term (current) drug therapy: Secondary | ICD-10-CM | POA: Diagnosis not present

## 2022-12-12 DIAGNOSIS — R7401 Elevation of levels of liver transaminase levels: Secondary | ICD-10-CM | POA: Diagnosis not present

## 2022-12-12 DIAGNOSIS — I160 Hypertensive urgency: Secondary | ICD-10-CM | POA: Diagnosis not present

## 2022-12-12 DIAGNOSIS — R079 Chest pain, unspecified: Secondary | ICD-10-CM | POA: Diagnosis not present

## 2022-12-13 DIAGNOSIS — R7401 Elevation of levels of liver transaminase levels: Secondary | ICD-10-CM | POA: Diagnosis not present

## 2022-12-13 DIAGNOSIS — I160 Hypertensive urgency: Secondary | ICD-10-CM | POA: Diagnosis not present

## 2022-12-13 DIAGNOSIS — R079 Chest pain, unspecified: Secondary | ICD-10-CM | POA: Diagnosis not present

## 2022-12-14 DIAGNOSIS — R079 Chest pain, unspecified: Secondary | ICD-10-CM | POA: Diagnosis not present

## 2022-12-14 DIAGNOSIS — R7401 Elevation of levels of liver transaminase levels: Secondary | ICD-10-CM | POA: Diagnosis not present

## 2022-12-14 DIAGNOSIS — I160 Hypertensive urgency: Secondary | ICD-10-CM | POA: Diagnosis not present

## 2022-12-18 ENCOUNTER — Ambulatory Visit: Payer: Self-pay

## 2022-12-18 NOTE — Patient Instructions (Signed)
Visit Information  Thank you for taking time to visit with me today. Please don't hesitate to contact me if I can be of assistance to you.   Following are the goals we discussed today:   Goals Addressed             This Visit's Progress    COMPLETED: Care Coordination Activities       Care Coordination Interventions: Spoke with grand-daughter who indicates [employer] has come to the patients home and has quoted bed bug treatment to cost $1600 Discussed patients family is planning to save funds for treatment Currently utilizing pest spray recommended by [employer] until they can afford professional treatment Determined patient recently admitted to the hospital Attempted to schedule call with RN Care Coordination but family declined stating home health is coming out to the home Instructed family to contact primary care provider as needed        If you are experiencing a Mental Health or [place] or need someone to talk to, please call 911  Patient verbalizes understanding of instructions and care plan provided today and agrees to view in [place]. Active MyChart status and patient understanding of how to access instructions and care plan via MyChart confirmed with patient.     No further follow up required: Please contact your primary care provider as needed.  Daneen Schick, BSW, CDP Social Worker, Certified Dementia Practitioner [facility]  Care Coordination [phone]

## 2022-12-18 NOTE — Patient Outreach (Signed)
  Care Coordination   Follow Up Visit Note   12/18/2022 Name: Nancy Lang MRN: [id number] DOB: 1946-09-20  Nancy Lang is a 77 y.o. year old female who sees Marge Duncans, [place] for primary care. I  spoke with patients grand-daughter by phone.  What matters to the patients health and wellness today?  Pest control    Goals Addressed             This Visit's Progress    COMPLETED: Care Coordination Activities       Care Coordination Interventions: Spoke with grand-daughter who indicates [employer] has come to the patients home and has quoted bed bug treatment to cost $1600 Discussed patients family is planning to save funds for treatment Currently utilizing pest spray recommended by [employer] until they can afford professional treatment Determined patient recently admitted to the hospital Attempted to schedule call with RN Care Coordination but family declined stating home health is coming out to the home Instructed family to contact primary care provider as needed        SDOH assessments and interventions completed:  No     Care Coordination Interventions:  Yes, provided   Follow up plan: No further intervention required.   Encounter Outcome:  Pt. Visit Completed   Daneen Schick, BSW, CDP Social Worker, Certified Dementia Practitioner [facility]  Care Coordination [phone]

## 2022-12-30 ENCOUNTER — Ambulatory Visit: Payer: Medicare HMO | Admitting: Physician Assistant

## 2022-12-31 ENCOUNTER — Ambulatory Visit: Payer: Medicare HMO | Attending: Cardiology | Admitting: Cardiology

## 2022-12-31 ENCOUNTER — Encounter: Payer: Self-pay | Admitting: Cardiology

## 2022-12-31 VITALS — BP 140/6 | HR 58 | Ht 59.0 in | Wt 99.8 lb

## 2022-12-31 DIAGNOSIS — Z794 Long term (current) use of insulin: Secondary | ICD-10-CM

## 2022-12-31 DIAGNOSIS — E782 Mixed hyperlipidemia: Secondary | ICD-10-CM | POA: Diagnosis not present

## 2022-12-31 DIAGNOSIS — F0390 Unspecified dementia without behavioral disturbance: Secondary | ICD-10-CM

## 2022-12-31 DIAGNOSIS — I10 Essential (primary) hypertension: Secondary | ICD-10-CM | POA: Diagnosis not present

## 2022-12-31 DIAGNOSIS — E1165 Type 2 diabetes mellitus with hyperglycemia: Secondary | ICD-10-CM

## 2022-12-31 DIAGNOSIS — I2510 Atherosclerotic heart disease of native coronary artery without angina pectoris: Secondary | ICD-10-CM | POA: Diagnosis not present

## 2022-12-31 NOTE — Patient Instructions (Addendum)
Medication Instructions:  Your physician recommends that you continue on your current medications as directed. Please refer to the Current Medication list given to you today.  *If you need a refill on your cardiac medications before your next appointment, please call your pharmacy*   Lab Work: BMP, CBC- today If you have labs (blood work) drawn today and your tests are completely normal, you will receive your results only by: [place] (if you have MyChart) OR A paper copy in the mail If you have any lab test that is abnormal or we need to change your treatment, we will call you to review the results.   Testing/Procedures: None Ordered   Follow-Up: At [facility], you and your health needs are our priority.  As part of our continuing mission to provide you with exceptional heart care, we have created designated Provider Care Teams.  These Care Teams include your primary Cardiologist (physician) and Advanced Practice Providers (APPs -  Physician Assistants and Nurse Practitioners) who all work together to provide you with the care you need, when you need it.  We recommend signing up for the patient portal called "MyChart".  Sign up information is provided on this After Visit Summary.  MyChart is used to connect with patients for Virtual Visits (Telemedicine).  Patients are able to view lab/test results, encounter notes, upcoming appointments, etc.  Non-urgent messages can be sent to your provider as well.   To learn more about what you can do with MyChart, go to [email or web address].    Your next appointment:   5 month(s)  The format for your next appointment:   In Person  Provider:   Jenne Campus, MD    Other Instructions NA

## 2022-12-31 NOTE — Addendum Note (Signed)
Addended by: Jacobo Forest D on: 12/31/2022 11:27 AM   Modules accepted: Orders

## 2022-12-31 NOTE — Progress Notes (Signed)
Cardiology Office Note:    Date:  12/31/2022   ID:  Shaley Heddings, [place] 1946-07-20, MRN [id number]  PCP:  Marge Duncans, PA-C  Cardiologist:  Jenne Campus, MD    Referring MD: Marge Duncans, PA-C   Chief Complaint  Patient presents with   Hospitalization Follow-up    History of Present Illness:    Nancy Lang is a 77 y.o. female  with past medical history significant for coronary artery disease, she did have PTCA and stenting of the mid left anterior descending artery in May 2018 with drug-eluting stent, prior of that she did have stenting to the circumflex artery. Also history of essential hypertension, diabetes, dyslipidemia, dementia. She was referred to [place] because recently she ended up being in the hospital her blood pressure was highly fluctuating she had a lot of high blood pressure.  Recently she ended up coming to [facility] because of chest pain started is very difficult to obtain from a lady because of hard of hearing as well as dementia regardless while in the hospital she did have a stress test done stress that showed no evidence of ischemia report of stress this is some "somewhat confusing ejection fraction is diminished there is some decreased mobility in the areas that do not have any fixed defect so it is difficult to interpret this there is clearly no ischemia the overall she is doing well according to her granddaughter who is with her in the room.  Denies have any more chest pain tightness squeezing pressure burning chest trying to BL be more active.  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   Coronary artery disease involving native coronary artery  of native heart 10/07/2016   Overview:  PTCA and drug-eluting stents to mid proximal portion to LAD and mid and proximal portion of circumflex in 2017 Drug-eluting stent mid LAD in May 2018   Dementia without behavioral disturbance ([place]) 03/10/2017   Diabetes mellitus without complication ([place])    Dyslipidemia (high LDL; low HDL) 10/07/2016   Essential hypertension, benign 10/07/2016   GERD (gastroesophageal reflux disease)    Hyperlipidemia    Hypertension    Insulin dependent type 2 diabetes mellitus, controlled ([place]) 10/07/2016   Malaise 01/20/2021   Memory impairment 03/10/2017   Mixed hyperlipidemia 12/06/2019   NSTEMI (non-ST elevated myocardial infarction) ([place]) 09/07/2016   Thyroid disease    Vitamin D deficiency 03/08/2017    Past Surgical History:  Procedure Laterality Date   CARDIAC CATHETERIZATION      Current Medications: Current Meds  Medication Sig   amLODipine (NORVASC) 5 MG tablet Take 5 mg by mouth daily.   aspirin 81 MG chewable tablet Chew 81 mg by mouth daily.    atorvastatin (LIPITOR) 40 MG tablet Take 40 mg by mouth at bedtime.   Calcium-Magnesium-Zinc (CAL-MAG-ZINC PO) Take 1 tablet by mouth daily with breakfast. Unknown strength per patient   Cholecalciferol (VITAMIN D3) 1000 units CAPS Take 1,000 Units by mouth daily.    diclofenac Sodium (VOLTAREN) 1 % GEL APPLY  2  GRAMS TO AFFECTED AREA(S) FOUR TIMES DAILY   donepezil (ARICEPT) 10 MG tablet TAKE 1 TABLET AT BEDTIME   insulin glargine (  LANTUS) 100 UNIT/ML injection Inject 20 Units into the skin daily.   isosorbide mononitrate (IMDUR) 30 MG 24 hr tablet Take 1 tablet (30 mg total) by mouth daily.   levothyroxine (SYNTHROID) 50 MCG tablet TAKE 1 TABLET EVERY DAY   loperamide (IMODIUM A-D) 2 MG tablet Take 1 tablet (2 mg total) by mouth 2 (two) times daily as needed for diarrhea or loose stools.   losartan (COZAAR) 50 MG tablet Take 50 mg by mouth daily.   memantine (NAMENDA) 10 MG tablet TAKE 1 TABLET TWICE  DAILY   metoprolol tartrate (LOPRESSOR) 100 MG tablet TAKE 1 TABLET TWICE DAILY   Multiple Vitamins-Minerals (ONE-A-DAY PROACTIVE 65+) TABS Take 1 tablet by mouth daily with breakfast. Unknown strength per patient   nitroGLYCERIN (NITROSTAT) 0.4 MG SL tablet Place 0.4 mg under the tongue every 5 (five) minutes as needed for chest pain.   Omega-3 1000 MG CAPS Take 1,000 mg by mouth daily.    pantoprazole (PROTONIX) 40 MG tablet TAKE 1 TABLET EVERY DAY   potassium chloride SA (KLOR-CON M) 20 MEQ tablet TAKE 1 TABLET EVERY DAY     Allergies:   Pravastatin, Ativan [lorazepam], Fenofibrate, Oxytetracycline, Penicillins, Septra [sulfamethoxazole-trimethoprim], Sulfa antibiotics, and Tetracyclines & related   Social History   Socioeconomic History   Marital status: Widowed    Spouse name: Not on file   Number of children: Not on file   Years of education: Not on file   Highest education level: Not on file  Occupational History   Occupation: retired  Tobacco Use   Smoking status: Never   Smokeless tobacco: Current    Types: Snuff   Tobacco comments:    one can a week  Vaping Use   Vaping Use: Never used  Substance and Sexual Activity   Alcohol use: No   Drug use: No   Sexual activity: Not Currently  Other Topics Concern   Not on file  Social History Narrative   Not on file   Social Determinants of Health   Financial Resource Strain: Not on file  Food Insecurity: Not on file  Transportation Needs: Not on file  Physical Activity: Not on file  Stress: Not on file  Social Connections: Not on file     Family History: The patient's family history includes Alcohol abuse in her father; Cancer in her mother; Diabetes in her mother; Heart disease in her father and mother. ROS:   Please see the history of present illness.    All 14 point review of systems negative except as described per history of present illness  EKGs/Labs/Other Studies Reviewed:      Recent  Labs: 09/23/2022: ALT 38; BUN 13; Creatinine, Ser 0.80; Hemoglobin 11.7; Platelets 320; Potassium 3.7; Sodium 142; TSH 2.380  Recent Lipid Panel    Component Value Date/Time   CHOL 130 09/23/2022 1036   TRIG 195 (H) 09/23/2022 1036   HDL 44 09/23/2022 1036   CHOLHDL 3.0 09/23/2022 1036   LDLCALC 54 09/23/2022 1036    Physical Exam:    VS:  BP (!) 140/6 (BP Location: Right Arm, Patient Position: Sitting, Cuff Size: Normal)   Pulse (!) 58   Ht '4\' 11"'$  (1.499 m)   Wt 99 lb 12.8 oz (45.3 kg)   SpO2 98%   BMI 20.16 kg/m     Wt Readings from Last 3 Encounters:  12/31/22 99 lb 12.8 oz (45.3 kg)  11/09/22 100 lb 3.2 oz (45.5 kg)  10/16/22 100 lb (45.4 kg)  GEN:  Well nourished, well developed in no acute distress HEENT: Normal NECK: No JVD; No carotid bruits LYMPHATICS: No lymphadenopathy CARDIAC: RRR, no murmurs, no rubs, no gallops RESPIRATORY:  Clear to auscultation without rales, wheezing or rhonchi  ABDOMEN: Soft, non-tender, non-distended MUSCULOSKELETAL:  No edema; No deformity  SKIN: Warm and dry LOWER EXTREMITIES: no swelling NEUROLOGIC:  Alert and oriented x 3 PSYCHIATRIC:  Normal affect   ASSESSMENT:    1. Essential hypertension, benign   2. Coronary artery disease involving native coronary artery of native heart without angina pectoris   3. Type 2 diabetes mellitus with hyperglycemia, with long-term current use of insulin ([place])   4. Dementia without behavioral disturbance ([place])   5. Mixed hyperlipidemia    PLAN:    In order of problems listed above:  Coronary artery disease recent hospitalization stress test reviewed showing no evidence of ischemia.  Will continue monitoring. Cardiomyopathy with ejection fraction 45% based on last stress test.  Will do echocardiogram to reassess left ventricle ejection fraction.  She is already on losartan which I will continue. Essential hypertension blood pressure well-controlled continue present management. Mixed  dyslipidemia continue with present management.  Record for me hospital review for this visit   Medication Adjustments/Labs and Tests Ordered: Current medicines are reviewed at length with the patient today.  Concerns regarding medicines are outlined above.  No orders of the defined types were placed in this encounter.  Medication changes: No orders of the defined types were placed in this encounter.   Signed, Park Liter, MD, [facility] 12/31/2022 11:22 AM    [place]

## 2023-01-01 LAB — BASIC METABOLIC PANEL
BUN/Creatinine Ratio: 22 (ref 12–28)
BUN: 20 mg/dL (ref 8–27)
CO2: 22 mmol/L (ref 20–29)
Calcium: 9.3 mg/dL (ref 8.7–10.3)
Chloride: 102 mmol/L (ref 96–106)
Creatinine, Ser: 0.89 mg/dL (ref 0.57–1.00)
Glucose: 329 mg/dL — ABNORMAL HIGH (ref 70–99)
Potassium: 4.1 mmol/L (ref 3.5–5.2)
Sodium: 141 mmol/L (ref 134–144)
eGFR: 67 mL/min/{1.73_m2} (ref >59)

## 2023-01-01 LAB — CBC
Hematocrit: 32.4 % — ABNORMAL LOW (ref 34.0–46.6)
Hemoglobin: 10.8 g/dL — ABNORMAL LOW (ref 11.1–15.9)
MCH: 33 pg (ref 26.6–33.0)
MCHC: 33.3 g/dL (ref 31.5–35.7)
MCV: 99 fL — ABNORMAL HIGH (ref 79–97)
Platelets: 304 10*3/uL (ref 150–450)
RBC: 3.27 x10E6/uL — ABNORMAL LOW (ref 3.77–5.28)
RDW: 13 % (ref 11.7–15.4)
WBC: 6.7 10*3/uL (ref 3.4–10.8)

## 2023-01-09 ENCOUNTER — Other Ambulatory Visit: Payer: Self-pay | Admitting: Cardiology

## 2023-01-09 ENCOUNTER — Other Ambulatory Visit: Payer: Self-pay | Admitting: Physician Assistant

## 2023-01-11 ENCOUNTER — Telehealth: Payer: Self-pay | Admitting: Cardiology

## 2023-01-11 NOTE — Telephone Encounter (Signed)
Patient's daughter informed of results.  

## 2023-01-11 NOTE — Telephone Encounter (Signed)
*  STAT* If patient is at the pharmacy, call can be transferred to refill team.   1. Which medications need to be refilled? (please list name of each medication and dose if known)           amLODipine (NORVASC) 5 MG tablet     losartan (COZAAR) 50 MG tablet    2. Which pharmacy/location (including street and city if local pharmacy) is medication to be sent to? [facility] [phone] - [place], [address] AT [place]   3. Do they need a 30 day or 90 day supply? 90 day supply

## 2023-01-11 NOTE — Telephone Encounter (Signed)
Pt's grand daughter was returning CMA call from 3/14 in regards to pt's results and would like a callback. Please advise.

## 2023-01-11 NOTE — Telephone Encounter (Signed)
Rx refill sent to pharmacy. 

## 2023-01-12 MED ORDER — AMLODIPINE BESYLATE 5 MG PO TABS: 5.0000 mg | ORAL_TABLET | Freq: Every day | ORAL | 1 refills | Status: DC

## 2023-01-12 MED ORDER — LOSARTAN POTASSIUM 50 MG PO TABS: 50.0000 mg | ORAL_TABLET | Freq: Every day | ORAL | 1 refills | Status: DC

## 2023-01-12 NOTE — Telephone Encounter (Signed)
Medication sent.

## 2023-02-04 ENCOUNTER — Other Ambulatory Visit: Payer: Self-pay | Admitting: Physician Assistant

## 2023-02-13 DIAGNOSIS — I10 Essential (primary) hypertension: Secondary | ICD-10-CM | POA: Diagnosis not present

## 2023-02-13 DIAGNOSIS — R9431 Abnormal electrocardiogram [ECG] [EKG]: Secondary | ICD-10-CM | POA: Diagnosis not present

## 2023-02-13 DIAGNOSIS — I447 Left bundle-branch block, unspecified: Secondary | ICD-10-CM | POA: Diagnosis not present

## 2023-02-13 DIAGNOSIS — R32 Unspecified urinary incontinence: Secondary | ICD-10-CM | POA: Diagnosis not present

## 2023-02-13 DIAGNOSIS — R4182 Altered mental status, unspecified: Secondary | ICD-10-CM | POA: Diagnosis not present

## 2023-02-13 DIAGNOSIS — W57XXXA Bitten or stung by nonvenomous insect and other nonvenomous arthropods, initial encounter: Secondary | ICD-10-CM | POA: Diagnosis not present

## 2023-02-13 DIAGNOSIS — T148XXA Other injury of unspecified body region, initial encounter: Secondary | ICD-10-CM | POA: Diagnosis not present

## 2023-02-13 DIAGNOSIS — R3981 Functional urinary incontinence: Secondary | ICD-10-CM | POA: Diagnosis not present

## 2023-02-13 DIAGNOSIS — M545 Low back pain, unspecified: Secondary | ICD-10-CM | POA: Diagnosis not present

## 2023-02-13 DIAGNOSIS — F0390 Unspecified dementia without behavioral disturbance: Secondary | ICD-10-CM | POA: Diagnosis not present

## 2023-02-13 DIAGNOSIS — N289 Disorder of kidney and ureter, unspecified: Secondary | ICD-10-CM | POA: Diagnosis not present

## 2023-02-13 DIAGNOSIS — S32040A Wedge compression fracture of fourth lumbar vertebra, initial encounter for closed fracture: Secondary | ICD-10-CM | POA: Diagnosis not present

## 2023-02-13 DIAGNOSIS — I444 Left anterior fascicular block: Secondary | ICD-10-CM | POA: Diagnosis not present

## 2023-02-13 DIAGNOSIS — N281 Cyst of kidney, acquired: Secondary | ICD-10-CM | POA: Diagnosis not present

## 2023-02-13 DIAGNOSIS — R079 Chest pain, unspecified: Secondary | ICD-10-CM | POA: Diagnosis not present

## 2023-02-13 DIAGNOSIS — R001 Bradycardia, unspecified: Secondary | ICD-10-CM | POA: Diagnosis not present

## 2023-02-18 DIAGNOSIS — S32000A Wedge compression fracture of unspecified lumbar vertebra, initial encounter for closed fracture: Secondary | ICD-10-CM | POA: Diagnosis not present

## 2023-02-24 DIAGNOSIS — G309 Alzheimer's disease, unspecified: Secondary | ICD-10-CM | POA: Diagnosis not present

## 2023-02-24 DIAGNOSIS — F419 Anxiety disorder, unspecified: Secondary | ICD-10-CM | POA: Diagnosis not present

## 2023-02-24 DIAGNOSIS — S32000D Wedge compression fracture of unspecified lumbar vertebra, subsequent encounter for fracture with routine healing: Secondary | ICD-10-CM | POA: Insufficient documentation

## 2023-02-24 DIAGNOSIS — Z8679 Personal history of other diseases of the circulatory system: Secondary | ICD-10-CM | POA: Diagnosis not present

## 2023-02-24 DIAGNOSIS — Z794 Long term (current) use of insulin: Secondary | ICD-10-CM | POA: Diagnosis not present

## 2023-02-24 DIAGNOSIS — R5381 Other malaise: Secondary | ICD-10-CM | POA: Diagnosis not present

## 2023-02-24 DIAGNOSIS — J310 Chronic rhinitis: Secondary | ICD-10-CM | POA: Insufficient documentation

## 2023-02-24 DIAGNOSIS — E039 Hypothyroidism, unspecified: Secondary | ICD-10-CM | POA: Diagnosis not present

## 2023-02-24 DIAGNOSIS — R5383 Other fatigue: Secondary | ICD-10-CM | POA: Diagnosis not present

## 2023-02-24 DIAGNOSIS — M810 Age-related osteoporosis without current pathological fracture: Secondary | ICD-10-CM | POA: Diagnosis not present

## 2023-02-24 DIAGNOSIS — E782 Mixed hyperlipidemia: Secondary | ICD-10-CM | POA: Diagnosis not present

## 2023-02-24 DIAGNOSIS — D638 Anemia in other chronic diseases classified elsewhere: Secondary | ICD-10-CM | POA: Insufficient documentation

## 2023-02-24 DIAGNOSIS — D649 Anemia, unspecified: Secondary | ICD-10-CM | POA: Diagnosis not present

## 2023-02-24 DIAGNOSIS — K7581 Nonalcoholic steatohepatitis (NASH): Secondary | ICD-10-CM | POA: Insufficient documentation

## 2023-02-24 DIAGNOSIS — I25110 Atherosclerotic heart disease of native coronary artery with unstable angina pectoris: Secondary | ICD-10-CM | POA: Diagnosis not present

## 2023-02-24 DIAGNOSIS — R3589 Other polyuria: Secondary | ICD-10-CM | POA: Diagnosis not present

## 2023-02-24 DIAGNOSIS — M8000XA Age-related osteoporosis with current pathological fracture, unspecified site, initial encounter for fracture: Secondary | ICD-10-CM | POA: Insufficient documentation

## 2023-02-24 DIAGNOSIS — E119 Type 2 diabetes mellitus without complications: Secondary | ICD-10-CM | POA: Diagnosis not present

## 2023-02-24 DIAGNOSIS — E538 Deficiency of other specified B group vitamins: Secondary | ICD-10-CM | POA: Insufficient documentation

## 2023-02-25 ENCOUNTER — Other Ambulatory Visit: Payer: Self-pay | Admitting: Physician Assistant

## 2023-03-13 DIAGNOSIS — R9431 Abnormal electrocardiogram [ECG] [EKG]: Secondary | ICD-10-CM | POA: Diagnosis not present

## 2023-03-13 DIAGNOSIS — E119 Type 2 diabetes mellitus without complications: Secondary | ICD-10-CM | POA: Diagnosis not present

## 2023-03-13 DIAGNOSIS — Z7982 Long term (current) use of aspirin: Secondary | ICD-10-CM | POA: Diagnosis not present

## 2023-03-13 DIAGNOSIS — R7401 Elevation of levels of liver transaminase levels: Secondary | ICD-10-CM | POA: Diagnosis not present

## 2023-03-13 DIAGNOSIS — E039 Hypothyroidism, unspecified: Secondary | ICD-10-CM | POA: Diagnosis not present

## 2023-03-13 DIAGNOSIS — E1165 Type 2 diabetes mellitus with hyperglycemia: Secondary | ICD-10-CM | POA: Diagnosis not present

## 2023-03-13 DIAGNOSIS — I160 Hypertensive urgency: Secondary | ICD-10-CM | POA: Diagnosis not present

## 2023-03-13 DIAGNOSIS — K30 Functional dyspepsia: Secondary | ICD-10-CM | POA: Diagnosis not present

## 2023-03-13 DIAGNOSIS — I10 Essential (primary) hypertension: Secondary | ICD-10-CM | POA: Diagnosis not present

## 2023-03-13 DIAGNOSIS — R079 Chest pain, unspecified: Secondary | ICD-10-CM | POA: Diagnosis not present

## 2023-03-13 DIAGNOSIS — Z79899 Other long term (current) drug therapy: Secondary | ICD-10-CM | POA: Diagnosis not present

## 2023-03-13 DIAGNOSIS — K219 Gastro-esophageal reflux disease without esophagitis: Secondary | ICD-10-CM | POA: Diagnosis not present

## 2023-03-13 DIAGNOSIS — F0390 Unspecified dementia without behavioral disturbance: Secondary | ICD-10-CM | POA: Diagnosis not present

## 2023-03-14 DIAGNOSIS — K30 Functional dyspepsia: Secondary | ICD-10-CM | POA: Diagnosis not present

## 2023-03-14 DIAGNOSIS — F0390 Unspecified dementia without behavioral disturbance: Secondary | ICD-10-CM | POA: Diagnosis not present

## 2023-03-14 DIAGNOSIS — Z9049 Acquired absence of other specified parts of digestive tract: Secondary | ICD-10-CM | POA: Diagnosis not present

## 2023-03-14 DIAGNOSIS — I447 Left bundle-branch block, unspecified: Secondary | ICD-10-CM | POA: Diagnosis not present

## 2023-03-14 DIAGNOSIS — R945 Abnormal results of liver function studies: Secondary | ICD-10-CM | POA: Diagnosis not present

## 2023-03-14 DIAGNOSIS — K219 Gastro-esophageal reflux disease without esophagitis: Secondary | ICD-10-CM | POA: Diagnosis not present

## 2023-03-15 DIAGNOSIS — R9431 Abnormal electrocardiogram [ECG] [EKG]: Secondary | ICD-10-CM | POA: Diagnosis not present

## 2023-03-15 DIAGNOSIS — K30 Functional dyspepsia: Secondary | ICD-10-CM | POA: Diagnosis not present

## 2023-03-15 DIAGNOSIS — I10 Essential (primary) hypertension: Secondary | ICD-10-CM | POA: Diagnosis not present

## 2023-03-15 DIAGNOSIS — R079 Chest pain, unspecified: Secondary | ICD-10-CM | POA: Diagnosis not present

## 2023-03-15 DIAGNOSIS — K219 Gastro-esophageal reflux disease without esophagitis: Secondary | ICD-10-CM | POA: Diagnosis not present

## 2023-03-15 DIAGNOSIS — E1165 Type 2 diabetes mellitus with hyperglycemia: Secondary | ICD-10-CM | POA: Diagnosis not present

## 2023-03-15 DIAGNOSIS — F0390 Unspecified dementia without behavioral disturbance: Secondary | ICD-10-CM | POA: Diagnosis not present

## 2023-03-24 DIAGNOSIS — E1122 Type 2 diabetes mellitus with diabetic chronic kidney disease: Secondary | ICD-10-CM | POA: Diagnosis not present

## 2023-03-24 DIAGNOSIS — E039 Hypothyroidism, unspecified: Secondary | ICD-10-CM | POA: Diagnosis not present

## 2023-03-24 DIAGNOSIS — N1831 Chronic kidney disease, stage 3a: Secondary | ICD-10-CM | POA: Diagnosis not present

## 2023-03-24 DIAGNOSIS — D638 Anemia in other chronic diseases classified elsewhere: Secondary | ICD-10-CM | POA: Diagnosis not present

## 2023-03-24 DIAGNOSIS — I25110 Atherosclerotic heart disease of native coronary artery with unstable angina pectoris: Secondary | ICD-10-CM | POA: Diagnosis not present

## 2023-03-24 DIAGNOSIS — R748 Abnormal levels of other serum enzymes: Secondary | ICD-10-CM | POA: Diagnosis not present

## 2023-03-24 DIAGNOSIS — M8000XD Age-related osteoporosis with current pathological fracture, unspecified site, subsequent encounter for fracture with routine healing: Secondary | ICD-10-CM | POA: Diagnosis not present

## 2023-03-24 DIAGNOSIS — K7581 Nonalcoholic steatohepatitis (NASH): Secondary | ICD-10-CM | POA: Diagnosis not present

## 2023-03-24 DIAGNOSIS — Z794 Long term (current) use of insulin: Secondary | ICD-10-CM | POA: Diagnosis not present

## 2023-03-30 ENCOUNTER — Encounter: Payer: Self-pay | Admitting: Internal Medicine

## 2023-04-15 DIAGNOSIS — S32000A Wedge compression fracture of unspecified lumbar vertebra, initial encounter for closed fracture: Secondary | ICD-10-CM | POA: Diagnosis not present

## 2023-04-24 ENCOUNTER — Other Ambulatory Visit: Payer: Self-pay | Admitting: Physician Assistant

## 2023-04-26 DIAGNOSIS — Z96641 Presence of right artificial hip joint: Secondary | ICD-10-CM | POA: Diagnosis not present

## 2023-04-26 DIAGNOSIS — M48061 Spinal stenosis, lumbar region without neurogenic claudication: Secondary | ICD-10-CM | POA: Diagnosis not present

## 2023-04-26 DIAGNOSIS — R918 Other nonspecific abnormal finding of lung field: Secondary | ICD-10-CM | POA: Diagnosis not present

## 2023-04-26 DIAGNOSIS — E039 Hypothyroidism, unspecified: Secondary | ICD-10-CM | POA: Diagnosis not present

## 2023-04-26 DIAGNOSIS — S32029A Unspecified fracture of second lumbar vertebra, initial encounter for closed fracture: Secondary | ICD-10-CM | POA: Diagnosis not present

## 2023-04-26 DIAGNOSIS — M4856XA Collapsed vertebra, not elsewhere classified, lumbar region, initial encounter for fracture: Secondary | ICD-10-CM | POA: Diagnosis not present

## 2023-04-26 DIAGNOSIS — E7800 Pure hypercholesterolemia, unspecified: Secondary | ICD-10-CM | POA: Diagnosis not present

## 2023-04-26 DIAGNOSIS — I509 Heart failure, unspecified: Secondary | ICD-10-CM | POA: Diagnosis not present

## 2023-04-26 DIAGNOSIS — S32040A Wedge compression fracture of fourth lumbar vertebra, initial encounter for closed fracture: Secondary | ICD-10-CM | POA: Diagnosis not present

## 2023-04-26 DIAGNOSIS — E119 Type 2 diabetes mellitus without complications: Secondary | ICD-10-CM | POA: Diagnosis not present

## 2023-04-26 DIAGNOSIS — R0602 Shortness of breath: Secondary | ICD-10-CM | POA: Diagnosis not present

## 2023-04-26 DIAGNOSIS — G2581 Restless legs syndrome: Secondary | ICD-10-CM | POA: Diagnosis not present

## 2023-04-26 DIAGNOSIS — M47816 Spondylosis without myelopathy or radiculopathy, lumbar region: Secondary | ICD-10-CM | POA: Diagnosis not present

## 2023-04-26 DIAGNOSIS — I5021 Acute systolic (congestive) heart failure: Secondary | ICD-10-CM | POA: Diagnosis not present

## 2023-04-26 DIAGNOSIS — M5136 Other intervertebral disc degeneration, lumbar region: Secondary | ICD-10-CM | POA: Diagnosis not present

## 2023-04-26 DIAGNOSIS — I700 Atherosclerosis of aorta: Secondary | ICD-10-CM | POA: Diagnosis not present

## 2023-04-26 DIAGNOSIS — J9811 Atelectasis: Secondary | ICD-10-CM | POA: Diagnosis not present

## 2023-04-26 DIAGNOSIS — R059 Cough, unspecified: Secondary | ICD-10-CM | POA: Diagnosis not present

## 2023-04-26 DIAGNOSIS — S32000G Wedge compression fracture of unspecified lumbar vertebra, subsequent encounter for fracture with delayed healing: Secondary | ICD-10-CM | POA: Diagnosis not present

## 2023-04-26 DIAGNOSIS — I447 Left bundle-branch block, unspecified: Secondary | ICD-10-CM | POA: Diagnosis not present

## 2023-04-26 DIAGNOSIS — I5023 Acute on chronic systolic (congestive) heart failure: Secondary | ICD-10-CM | POA: Diagnosis not present

## 2023-04-26 DIAGNOSIS — J984 Other disorders of lung: Secondary | ICD-10-CM | POA: Diagnosis not present

## 2023-04-26 DIAGNOSIS — I2510 Atherosclerotic heart disease of native coronary artery without angina pectoris: Secondary | ICD-10-CM | POA: Diagnosis not present

## 2023-04-26 DIAGNOSIS — K219 Gastro-esophageal reflux disease without esophagitis: Secondary | ICD-10-CM | POA: Diagnosis not present

## 2023-04-26 DIAGNOSIS — I110 Hypertensive heart disease with heart failure: Secondary | ICD-10-CM | POA: Diagnosis not present

## 2023-04-26 DIAGNOSIS — J90 Pleural effusion, not elsewhere classified: Secondary | ICD-10-CM | POA: Diagnosis not present

## 2023-04-26 DIAGNOSIS — F0390 Unspecified dementia without behavioral disturbance: Secondary | ICD-10-CM | POA: Diagnosis not present

## 2023-04-26 DIAGNOSIS — W19XXXA Unspecified fall, initial encounter: Secondary | ICD-10-CM | POA: Diagnosis not present

## 2023-04-28 ENCOUNTER — Other Ambulatory Visit: Payer: Self-pay | Admitting: Physician Assistant

## 2023-04-30 DIAGNOSIS — J984 Other disorders of lung: Secondary | ICD-10-CM | POA: Diagnosis not present

## 2023-04-30 DIAGNOSIS — J90 Pleural effusion, not elsewhere classified: Secondary | ICD-10-CM | POA: Diagnosis not present

## 2023-04-30 DIAGNOSIS — Z96641 Presence of right artificial hip joint: Secondary | ICD-10-CM | POA: Diagnosis not present

## 2023-05-03 DIAGNOSIS — S32029A Unspecified fracture of second lumbar vertebra, initial encounter for closed fracture: Secondary | ICD-10-CM | POA: Diagnosis not present

## 2023-05-03 DIAGNOSIS — M47816 Spondylosis without myelopathy or radiculopathy, lumbar region: Secondary | ICD-10-CM | POA: Diagnosis not present

## 2023-05-03 DIAGNOSIS — M5136 Other intervertebral disc degeneration, lumbar region: Secondary | ICD-10-CM | POA: Diagnosis not present

## 2023-05-03 DIAGNOSIS — M48061 Spinal stenosis, lumbar region without neurogenic claudication: Secondary | ICD-10-CM | POA: Diagnosis not present

## 2023-05-10 ENCOUNTER — Encounter: Payer: Self-pay | Admitting: Internal Medicine

## 2023-05-10 DIAGNOSIS — N1831 Chronic kidney disease, stage 3a: Secondary | ICD-10-CM | POA: Diagnosis not present

## 2023-05-10 DIAGNOSIS — S32009A Unspecified fracture of unspecified lumbar vertebra, initial encounter for closed fracture: Secondary | ICD-10-CM | POA: Diagnosis not present

## 2023-05-10 DIAGNOSIS — G8929 Other chronic pain: Secondary | ICD-10-CM | POA: Diagnosis not present

## 2023-05-10 DIAGNOSIS — E1122 Type 2 diabetes mellitus with diabetic chronic kidney disease: Secondary | ICD-10-CM | POA: Diagnosis not present

## 2023-05-10 DIAGNOSIS — D631 Anemia in chronic kidney disease: Secondary | ICD-10-CM | POA: Diagnosis not present

## 2023-05-10 DIAGNOSIS — I700 Atherosclerosis of aorta: Secondary | ICD-10-CM | POA: Diagnosis not present

## 2023-05-10 DIAGNOSIS — I5023 Acute on chronic systolic (congestive) heart failure: Secondary | ICD-10-CM | POA: Diagnosis not present

## 2023-05-10 DIAGNOSIS — I130 Hypertensive heart and chronic kidney disease with heart failure and stage 1 through stage 4 chronic kidney disease, or unspecified chronic kidney disease: Secondary | ICD-10-CM | POA: Diagnosis not present

## 2023-05-10 DIAGNOSIS — G309 Alzheimer's disease, unspecified: Secondary | ICD-10-CM | POA: Diagnosis not present

## 2023-05-10 DIAGNOSIS — M8008XD Age-related osteoporosis with current pathological fracture, vertebra(e), subsequent encounter for fracture with routine healing: Secondary | ICD-10-CM | POA: Diagnosis not present

## 2023-05-11 DIAGNOSIS — D631 Anemia in chronic kidney disease: Secondary | ICD-10-CM | POA: Diagnosis not present

## 2023-05-11 DIAGNOSIS — I130 Hypertensive heart and chronic kidney disease with heart failure and stage 1 through stage 4 chronic kidney disease, or unspecified chronic kidney disease: Secondary | ICD-10-CM | POA: Diagnosis not present

## 2023-05-11 DIAGNOSIS — M8008XD Age-related osteoporosis with current pathological fracture, vertebra(e), subsequent encounter for fracture with routine healing: Secondary | ICD-10-CM | POA: Diagnosis not present

## 2023-05-11 DIAGNOSIS — G309 Alzheimer's disease, unspecified: Secondary | ICD-10-CM | POA: Diagnosis not present

## 2023-05-11 DIAGNOSIS — N1831 Chronic kidney disease, stage 3a: Secondary | ICD-10-CM | POA: Diagnosis not present

## 2023-05-11 DIAGNOSIS — G8929 Other chronic pain: Secondary | ICD-10-CM | POA: Diagnosis not present

## 2023-05-11 DIAGNOSIS — I700 Atherosclerosis of aorta: Secondary | ICD-10-CM | POA: Diagnosis not present

## 2023-05-11 DIAGNOSIS — E1122 Type 2 diabetes mellitus with diabetic chronic kidney disease: Secondary | ICD-10-CM | POA: Diagnosis not present

## 2023-05-11 DIAGNOSIS — I5023 Acute on chronic systolic (congestive) heart failure: Secondary | ICD-10-CM | POA: Diagnosis not present

## 2023-05-17 DIAGNOSIS — I2510 Atherosclerotic heart disease of native coronary artery without angina pectoris: Secondary | ICD-10-CM | POA: Diagnosis not present

## 2023-05-17 DIAGNOSIS — N1831 Chronic kidney disease, stage 3a: Secondary | ICD-10-CM | POA: Diagnosis not present

## 2023-05-17 DIAGNOSIS — M25551 Pain in right hip: Secondary | ICD-10-CM | POA: Diagnosis not present

## 2023-05-17 DIAGNOSIS — I5022 Chronic systolic (congestive) heart failure: Secondary | ICD-10-CM | POA: Diagnosis not present

## 2023-05-17 DIAGNOSIS — Z96641 Presence of right artificial hip joint: Secondary | ICD-10-CM | POA: Diagnosis not present

## 2023-05-17 DIAGNOSIS — G8929 Other chronic pain: Secondary | ICD-10-CM | POA: Diagnosis not present

## 2023-05-17 DIAGNOSIS — R339 Retention of urine, unspecified: Secondary | ICD-10-CM | POA: Diagnosis not present

## 2023-05-17 DIAGNOSIS — F05 Delirium due to known physiological condition: Secondary | ICD-10-CM | POA: Diagnosis not present

## 2023-05-17 DIAGNOSIS — E119 Type 2 diabetes mellitus without complications: Secondary | ICD-10-CM | POA: Diagnosis not present

## 2023-05-17 DIAGNOSIS — I130 Hypertensive heart and chronic kidney disease with heart failure and stage 1 through stage 4 chronic kidney disease, or unspecified chronic kidney disease: Secondary | ICD-10-CM | POA: Diagnosis not present

## 2023-05-17 DIAGNOSIS — I700 Atherosclerosis of aorta: Secondary | ICD-10-CM | POA: Diagnosis not present

## 2023-05-17 DIAGNOSIS — R4182 Altered mental status, unspecified: Secondary | ICD-10-CM | POA: Diagnosis not present

## 2023-05-17 DIAGNOSIS — I110 Hypertensive heart disease with heart failure: Secondary | ICD-10-CM | POA: Diagnosis not present

## 2023-05-17 DIAGNOSIS — K838 Other specified diseases of biliary tract: Secondary | ICD-10-CM | POA: Diagnosis not present

## 2023-05-17 DIAGNOSIS — G309 Alzheimer's disease, unspecified: Secondary | ICD-10-CM | POA: Diagnosis not present

## 2023-05-17 DIAGNOSIS — M8008XD Age-related osteoporosis with current pathological fracture, vertebra(e), subsequent encounter for fracture with routine healing: Secondary | ICD-10-CM | POA: Diagnosis not present

## 2023-05-17 DIAGNOSIS — E1122 Type 2 diabetes mellitus with diabetic chronic kidney disease: Secondary | ICD-10-CM | POA: Diagnosis not present

## 2023-05-17 DIAGNOSIS — I4891 Unspecified atrial fibrillation: Secondary | ICD-10-CM | POA: Diagnosis not present

## 2023-05-17 DIAGNOSIS — N390 Urinary tract infection, site not specified: Secondary | ICD-10-CM | POA: Diagnosis not present

## 2023-05-17 DIAGNOSIS — M8580 Other specified disorders of bone density and structure, unspecified site: Secondary | ICD-10-CM | POA: Diagnosis not present

## 2023-05-17 DIAGNOSIS — D631 Anemia in chronic kidney disease: Secondary | ICD-10-CM | POA: Diagnosis not present

## 2023-05-17 DIAGNOSIS — E039 Hypothyroidism, unspecified: Secondary | ICD-10-CM | POA: Diagnosis not present

## 2023-05-17 DIAGNOSIS — M47816 Spondylosis without myelopathy or radiculopathy, lumbar region: Secondary | ICD-10-CM | POA: Diagnosis not present

## 2023-05-17 DIAGNOSIS — I5023 Acute on chronic systolic (congestive) heart failure: Secondary | ICD-10-CM | POA: Diagnosis not present

## 2023-05-17 DIAGNOSIS — K5900 Constipation, unspecified: Secondary | ICD-10-CM | POA: Diagnosis not present

## 2023-05-20 DIAGNOSIS — I5023 Acute on chronic systolic (congestive) heart failure: Secondary | ICD-10-CM | POA: Diagnosis not present

## 2023-05-20 DIAGNOSIS — I700 Atherosclerosis of aorta: Secondary | ICD-10-CM | POA: Diagnosis not present

## 2023-05-20 DIAGNOSIS — D631 Anemia in chronic kidney disease: Secondary | ICD-10-CM | POA: Diagnosis not present

## 2023-05-20 DIAGNOSIS — I130 Hypertensive heart and chronic kidney disease with heart failure and stage 1 through stage 4 chronic kidney disease, or unspecified chronic kidney disease: Secondary | ICD-10-CM | POA: Diagnosis not present

## 2023-05-20 DIAGNOSIS — G8929 Other chronic pain: Secondary | ICD-10-CM | POA: Diagnosis not present

## 2023-05-20 DIAGNOSIS — N1831 Chronic kidney disease, stage 3a: Secondary | ICD-10-CM | POA: Diagnosis not present

## 2023-05-20 DIAGNOSIS — E1122 Type 2 diabetes mellitus with diabetic chronic kidney disease: Secondary | ICD-10-CM | POA: Diagnosis not present

## 2023-05-20 DIAGNOSIS — G309 Alzheimer's disease, unspecified: Secondary | ICD-10-CM | POA: Diagnosis not present

## 2023-05-20 DIAGNOSIS — M8008XD Age-related osteoporosis with current pathological fracture, vertebra(e), subsequent encounter for fracture with routine healing: Secondary | ICD-10-CM | POA: Diagnosis not present

## 2023-05-21 DIAGNOSIS — D631 Anemia in chronic kidney disease: Secondary | ICD-10-CM | POA: Diagnosis not present

## 2023-05-21 DIAGNOSIS — I5023 Acute on chronic systolic (congestive) heart failure: Secondary | ICD-10-CM | POA: Diagnosis not present

## 2023-05-21 DIAGNOSIS — N1831 Chronic kidney disease, stage 3a: Secondary | ICD-10-CM | POA: Diagnosis not present

## 2023-05-21 DIAGNOSIS — I700 Atherosclerosis of aorta: Secondary | ICD-10-CM | POA: Diagnosis not present

## 2023-05-21 DIAGNOSIS — E1122 Type 2 diabetes mellitus with diabetic chronic kidney disease: Secondary | ICD-10-CM | POA: Diagnosis not present

## 2023-05-21 DIAGNOSIS — I130 Hypertensive heart and chronic kidney disease with heart failure and stage 1 through stage 4 chronic kidney disease, or unspecified chronic kidney disease: Secondary | ICD-10-CM | POA: Diagnosis not present

## 2023-05-21 DIAGNOSIS — G309 Alzheimer's disease, unspecified: Secondary | ICD-10-CM | POA: Diagnosis not present

## 2023-05-21 DIAGNOSIS — G8929 Other chronic pain: Secondary | ICD-10-CM | POA: Diagnosis not present

## 2023-05-21 DIAGNOSIS — M8008XD Age-related osteoporosis with current pathological fracture, vertebra(e), subsequent encounter for fracture with routine healing: Secondary | ICD-10-CM | POA: Diagnosis not present

## 2023-05-22 DIAGNOSIS — M8008XD Age-related osteoporosis with current pathological fracture, vertebra(e), subsequent encounter for fracture with routine healing: Secondary | ICD-10-CM | POA: Diagnosis not present

## 2023-05-22 DIAGNOSIS — I5023 Acute on chronic systolic (congestive) heart failure: Secondary | ICD-10-CM | POA: Diagnosis not present

## 2023-05-22 DIAGNOSIS — I130 Hypertensive heart and chronic kidney disease with heart failure and stage 1 through stage 4 chronic kidney disease, or unspecified chronic kidney disease: Secondary | ICD-10-CM | POA: Diagnosis not present

## 2023-05-22 DIAGNOSIS — G309 Alzheimer's disease, unspecified: Secondary | ICD-10-CM | POA: Diagnosis not present

## 2023-05-22 DIAGNOSIS — N1831 Chronic kidney disease, stage 3a: Secondary | ICD-10-CM | POA: Diagnosis not present

## 2023-05-22 DIAGNOSIS — G8929 Other chronic pain: Secondary | ICD-10-CM | POA: Diagnosis not present

## 2023-05-22 DIAGNOSIS — D631 Anemia in chronic kidney disease: Secondary | ICD-10-CM | POA: Diagnosis not present

## 2023-05-22 DIAGNOSIS — I700 Atherosclerosis of aorta: Secondary | ICD-10-CM | POA: Diagnosis not present

## 2023-05-22 DIAGNOSIS — E1122 Type 2 diabetes mellitus with diabetic chronic kidney disease: Secondary | ICD-10-CM | POA: Diagnosis not present

## 2023-05-24 DIAGNOSIS — I25110 Atherosclerotic heart disease of native coronary artery with unstable angina pectoris: Secondary | ICD-10-CM | POA: Diagnosis not present

## 2023-05-24 DIAGNOSIS — R339 Retention of urine, unspecified: Secondary | ICD-10-CM | POA: Insufficient documentation

## 2023-05-24 DIAGNOSIS — K7581 Nonalcoholic steatohepatitis (NASH): Secondary | ICD-10-CM | POA: Diagnosis not present

## 2023-05-24 DIAGNOSIS — I480 Paroxysmal atrial fibrillation: Secondary | ICD-10-CM | POA: Insufficient documentation

## 2023-05-24 DIAGNOSIS — K5900 Constipation, unspecified: Secondary | ICD-10-CM | POA: Diagnosis not present

## 2023-05-24 DIAGNOSIS — M8000XD Age-related osteoporosis with current pathological fracture, unspecified site, subsequent encounter for fracture with routine healing: Secondary | ICD-10-CM | POA: Diagnosis not present

## 2023-05-24 DIAGNOSIS — F411 Generalized anxiety disorder: Secondary | ICD-10-CM | POA: Diagnosis not present

## 2023-05-24 DIAGNOSIS — G309 Alzheimer's disease, unspecified: Secondary | ICD-10-CM | POA: Diagnosis not present

## 2023-05-24 DIAGNOSIS — Z7189 Other specified counseling: Secondary | ICD-10-CM | POA: Diagnosis not present

## 2023-05-24 DIAGNOSIS — D638 Anemia in other chronic diseases classified elsewhere: Secondary | ICD-10-CM | POA: Diagnosis not present

## 2023-05-26 DIAGNOSIS — G309 Alzheimer's disease, unspecified: Secondary | ICD-10-CM | POA: Diagnosis not present

## 2023-05-26 DIAGNOSIS — N1831 Chronic kidney disease, stage 3a: Secondary | ICD-10-CM | POA: Diagnosis not present

## 2023-05-26 DIAGNOSIS — G8929 Other chronic pain: Secondary | ICD-10-CM | POA: Diagnosis not present

## 2023-05-26 DIAGNOSIS — D631 Anemia in chronic kidney disease: Secondary | ICD-10-CM | POA: Diagnosis not present

## 2023-05-26 DIAGNOSIS — I700 Atherosclerosis of aorta: Secondary | ICD-10-CM | POA: Diagnosis not present

## 2023-05-26 DIAGNOSIS — M8008XD Age-related osteoporosis with current pathological fracture, vertebra(e), subsequent encounter for fracture with routine healing: Secondary | ICD-10-CM | POA: Diagnosis not present

## 2023-05-26 DIAGNOSIS — I5023 Acute on chronic systolic (congestive) heart failure: Secondary | ICD-10-CM | POA: Diagnosis not present

## 2023-05-26 DIAGNOSIS — E1122 Type 2 diabetes mellitus with diabetic chronic kidney disease: Secondary | ICD-10-CM | POA: Diagnosis not present

## 2023-05-26 DIAGNOSIS — I130 Hypertensive heart and chronic kidney disease with heart failure and stage 1 through stage 4 chronic kidney disease, or unspecified chronic kidney disease: Secondary | ICD-10-CM | POA: Diagnosis not present

## 2023-05-28 DIAGNOSIS — G8929 Other chronic pain: Secondary | ICD-10-CM | POA: Diagnosis not present

## 2023-05-28 DIAGNOSIS — I130 Hypertensive heart and chronic kidney disease with heart failure and stage 1 through stage 4 chronic kidney disease, or unspecified chronic kidney disease: Secondary | ICD-10-CM | POA: Diagnosis not present

## 2023-05-28 DIAGNOSIS — I5023 Acute on chronic systolic (congestive) heart failure: Secondary | ICD-10-CM | POA: Diagnosis not present

## 2023-05-28 DIAGNOSIS — E1122 Type 2 diabetes mellitus with diabetic chronic kidney disease: Secondary | ICD-10-CM | POA: Diagnosis not present

## 2023-05-28 DIAGNOSIS — M8008XD Age-related osteoporosis with current pathological fracture, vertebra(e), subsequent encounter for fracture with routine healing: Secondary | ICD-10-CM | POA: Diagnosis not present

## 2023-05-28 DIAGNOSIS — G309 Alzheimer's disease, unspecified: Secondary | ICD-10-CM | POA: Diagnosis not present

## 2023-05-28 DIAGNOSIS — N1831 Chronic kidney disease, stage 3a: Secondary | ICD-10-CM | POA: Diagnosis not present

## 2023-05-28 DIAGNOSIS — D631 Anemia in chronic kidney disease: Secondary | ICD-10-CM | POA: Diagnosis not present

## 2023-05-28 DIAGNOSIS — I700 Atherosclerosis of aorta: Secondary | ICD-10-CM | POA: Diagnosis not present

## 2023-06-02 DIAGNOSIS — I700 Atherosclerosis of aorta: Secondary | ICD-10-CM | POA: Diagnosis not present

## 2023-06-02 DIAGNOSIS — I5023 Acute on chronic systolic (congestive) heart failure: Secondary | ICD-10-CM | POA: Diagnosis not present

## 2023-06-02 DIAGNOSIS — M8008XD Age-related osteoporosis with current pathological fracture, vertebra(e), subsequent encounter for fracture with routine healing: Secondary | ICD-10-CM | POA: Diagnosis not present

## 2023-06-02 DIAGNOSIS — G309 Alzheimer's disease, unspecified: Secondary | ICD-10-CM | POA: Diagnosis not present

## 2023-06-02 DIAGNOSIS — G8929 Other chronic pain: Secondary | ICD-10-CM | POA: Diagnosis not present

## 2023-06-02 DIAGNOSIS — I130 Hypertensive heart and chronic kidney disease with heart failure and stage 1 through stage 4 chronic kidney disease, or unspecified chronic kidney disease: Secondary | ICD-10-CM | POA: Diagnosis not present

## 2023-06-02 DIAGNOSIS — N1831 Chronic kidney disease, stage 3a: Secondary | ICD-10-CM | POA: Diagnosis not present

## 2023-06-02 DIAGNOSIS — E1122 Type 2 diabetes mellitus with diabetic chronic kidney disease: Secondary | ICD-10-CM | POA: Diagnosis not present

## 2023-06-02 DIAGNOSIS — D631 Anemia in chronic kidney disease: Secondary | ICD-10-CM | POA: Diagnosis not present

## 2023-06-04 DIAGNOSIS — I700 Atherosclerosis of aorta: Secondary | ICD-10-CM | POA: Diagnosis not present

## 2023-06-04 DIAGNOSIS — I130 Hypertensive heart and chronic kidney disease with heart failure and stage 1 through stage 4 chronic kidney disease, or unspecified chronic kidney disease: Secondary | ICD-10-CM | POA: Diagnosis not present

## 2023-06-04 DIAGNOSIS — N1831 Chronic kidney disease, stage 3a: Secondary | ICD-10-CM | POA: Diagnosis not present

## 2023-06-04 DIAGNOSIS — E1122 Type 2 diabetes mellitus with diabetic chronic kidney disease: Secondary | ICD-10-CM | POA: Diagnosis not present

## 2023-06-04 DIAGNOSIS — M8008XD Age-related osteoporosis with current pathological fracture, vertebra(e), subsequent encounter for fracture with routine healing: Secondary | ICD-10-CM | POA: Diagnosis not present

## 2023-06-04 DIAGNOSIS — G8929 Other chronic pain: Secondary | ICD-10-CM | POA: Diagnosis not present

## 2023-06-04 DIAGNOSIS — D631 Anemia in chronic kidney disease: Secondary | ICD-10-CM | POA: Diagnosis not present

## 2023-06-04 DIAGNOSIS — G309 Alzheimer's disease, unspecified: Secondary | ICD-10-CM | POA: Diagnosis not present

## 2023-06-04 DIAGNOSIS — I5023 Acute on chronic systolic (congestive) heart failure: Secondary | ICD-10-CM | POA: Diagnosis not present

## 2023-06-07 DIAGNOSIS — E1122 Type 2 diabetes mellitus with diabetic chronic kidney disease: Secondary | ICD-10-CM | POA: Diagnosis not present

## 2023-06-07 DIAGNOSIS — I130 Hypertensive heart and chronic kidney disease with heart failure and stage 1 through stage 4 chronic kidney disease, or unspecified chronic kidney disease: Secondary | ICD-10-CM | POA: Diagnosis not present

## 2023-06-07 DIAGNOSIS — N1831 Chronic kidney disease, stage 3a: Secondary | ICD-10-CM | POA: Diagnosis not present

## 2023-06-07 DIAGNOSIS — D631 Anemia in chronic kidney disease: Secondary | ICD-10-CM | POA: Diagnosis not present

## 2023-06-07 DIAGNOSIS — I700 Atherosclerosis of aorta: Secondary | ICD-10-CM | POA: Diagnosis not present

## 2023-06-07 DIAGNOSIS — G8929 Other chronic pain: Secondary | ICD-10-CM | POA: Diagnosis not present

## 2023-06-07 DIAGNOSIS — M8008XD Age-related osteoporosis with current pathological fracture, vertebra(e), subsequent encounter for fracture with routine healing: Secondary | ICD-10-CM | POA: Diagnosis not present

## 2023-06-07 DIAGNOSIS — G309 Alzheimer's disease, unspecified: Secondary | ICD-10-CM | POA: Diagnosis not present

## 2023-06-07 DIAGNOSIS — I5023 Acute on chronic systolic (congestive) heart failure: Secondary | ICD-10-CM | POA: Diagnosis not present

## 2023-06-08 DIAGNOSIS — E1122 Type 2 diabetes mellitus with diabetic chronic kidney disease: Secondary | ICD-10-CM | POA: Diagnosis not present

## 2023-06-08 DIAGNOSIS — I5023 Acute on chronic systolic (congestive) heart failure: Secondary | ICD-10-CM | POA: Diagnosis not present

## 2023-06-08 DIAGNOSIS — D631 Anemia in chronic kidney disease: Secondary | ICD-10-CM | POA: Diagnosis not present

## 2023-06-08 DIAGNOSIS — G309 Alzheimer's disease, unspecified: Secondary | ICD-10-CM | POA: Diagnosis not present

## 2023-06-08 DIAGNOSIS — I130 Hypertensive heart and chronic kidney disease with heart failure and stage 1 through stage 4 chronic kidney disease, or unspecified chronic kidney disease: Secondary | ICD-10-CM | POA: Diagnosis not present

## 2023-06-08 DIAGNOSIS — G8929 Other chronic pain: Secondary | ICD-10-CM | POA: Diagnosis not present

## 2023-06-08 DIAGNOSIS — I700 Atherosclerosis of aorta: Secondary | ICD-10-CM | POA: Diagnosis not present

## 2023-06-08 DIAGNOSIS — N1831 Chronic kidney disease, stage 3a: Secondary | ICD-10-CM | POA: Diagnosis not present

## 2023-06-08 DIAGNOSIS — M8008XD Age-related osteoporosis with current pathological fracture, vertebra(e), subsequent encounter for fracture with routine healing: Secondary | ICD-10-CM | POA: Diagnosis not present

## 2023-06-09 DIAGNOSIS — N390 Urinary tract infection, site not specified: Secondary | ICD-10-CM | POA: Diagnosis not present

## 2023-06-11 DIAGNOSIS — R1084 Generalized abdominal pain: Secondary | ICD-10-CM | POA: Diagnosis not present

## 2023-06-11 DIAGNOSIS — K8689 Other specified diseases of pancreas: Secondary | ICD-10-CM | POA: Diagnosis not present

## 2023-06-11 DIAGNOSIS — M545 Low back pain, unspecified: Secondary | ICD-10-CM | POA: Diagnosis not present

## 2023-06-11 DIAGNOSIS — M549 Dorsalgia, unspecified: Secondary | ICD-10-CM | POA: Diagnosis not present

## 2023-06-11 DIAGNOSIS — R109 Unspecified abdominal pain: Secondary | ICD-10-CM | POA: Diagnosis not present

## 2023-06-11 DIAGNOSIS — I4891 Unspecified atrial fibrillation: Secondary | ICD-10-CM | POA: Diagnosis not present

## 2023-06-11 DIAGNOSIS — R079 Chest pain, unspecified: Secondary | ICD-10-CM | POA: Diagnosis not present

## 2023-06-11 DIAGNOSIS — F03C11 Unspecified dementia, severe, with agitation: Secondary | ICD-10-CM | POA: Diagnosis not present

## 2023-06-11 DIAGNOSIS — I10 Essential (primary) hypertension: Secondary | ICD-10-CM | POA: Diagnosis not present

## 2023-06-12 DIAGNOSIS — I10 Essential (primary) hypertension: Secondary | ICD-10-CM | POA: Diagnosis not present

## 2023-06-12 DIAGNOSIS — I5022 Chronic systolic (congestive) heart failure: Secondary | ICD-10-CM | POA: Diagnosis not present

## 2023-06-12 DIAGNOSIS — Z681 Body mass index (BMI) 19 or less, adult: Secondary | ICD-10-CM | POA: Diagnosis not present

## 2023-06-12 DIAGNOSIS — R109 Unspecified abdominal pain: Secondary | ICD-10-CM | POA: Diagnosis not present

## 2023-06-12 DIAGNOSIS — I110 Hypertensive heart disease with heart failure: Secondary | ICD-10-CM | POA: Diagnosis not present

## 2023-06-12 DIAGNOSIS — R7989 Other specified abnormal findings of blood chemistry: Secondary | ICD-10-CM | POA: Diagnosis not present

## 2023-06-12 DIAGNOSIS — F03C11 Unspecified dementia, severe, with agitation: Secondary | ICD-10-CM | POA: Diagnosis not present

## 2023-06-12 DIAGNOSIS — B952 Enterococcus as the cause of diseases classified elsewhere: Secondary | ICD-10-CM | POA: Diagnosis not present

## 2023-06-12 DIAGNOSIS — R1032 Left lower quadrant pain: Secondary | ICD-10-CM | POA: Diagnosis not present

## 2023-06-12 DIAGNOSIS — K8689 Other specified diseases of pancreas: Secondary | ICD-10-CM | POA: Diagnosis not present

## 2023-06-12 DIAGNOSIS — I2510 Atherosclerotic heart disease of native coronary artery without angina pectoris: Secondary | ICD-10-CM | POA: Diagnosis not present

## 2023-06-12 DIAGNOSIS — M79605 Pain in left leg: Secondary | ICD-10-CM | POA: Diagnosis not present

## 2023-06-12 DIAGNOSIS — I447 Left bundle-branch block, unspecified: Secondary | ICD-10-CM | POA: Diagnosis not present

## 2023-06-12 DIAGNOSIS — M79604 Pain in right leg: Secondary | ICD-10-CM | POA: Diagnosis not present

## 2023-06-12 DIAGNOSIS — T83511A Infection and inflammatory reaction due to indwelling urethral catheter, initial encounter: Secondary | ICD-10-CM | POA: Diagnosis not present

## 2023-06-12 DIAGNOSIS — R1084 Generalized abdominal pain: Secondary | ICD-10-CM | POA: Diagnosis not present

## 2023-06-12 DIAGNOSIS — E7800 Pure hypercholesterolemia, unspecified: Secondary | ICD-10-CM | POA: Diagnosis not present

## 2023-06-12 DIAGNOSIS — I700 Atherosclerosis of aorta: Secondary | ICD-10-CM | POA: Diagnosis not present

## 2023-06-12 DIAGNOSIS — I214 Non-ST elevation (NSTEMI) myocardial infarction: Secondary | ICD-10-CM | POA: Diagnosis not present

## 2023-06-12 DIAGNOSIS — I4891 Unspecified atrial fibrillation: Secondary | ICD-10-CM | POA: Diagnosis not present

## 2023-06-12 DIAGNOSIS — R1031 Right lower quadrant pain: Secondary | ICD-10-CM | POA: Diagnosis not present

## 2023-06-12 DIAGNOSIS — E43 Unspecified severe protein-calorie malnutrition: Secondary | ICD-10-CM | POA: Diagnosis not present

## 2023-06-12 DIAGNOSIS — N390 Urinary tract infection, site not specified: Secondary | ICD-10-CM | POA: Diagnosis not present

## 2023-06-13 DIAGNOSIS — I214 Non-ST elevation (NSTEMI) myocardial infarction: Secondary | ICD-10-CM | POA: Diagnosis not present

## 2023-06-13 DIAGNOSIS — E43 Unspecified severe protein-calorie malnutrition: Secondary | ICD-10-CM | POA: Diagnosis not present

## 2023-06-13 DIAGNOSIS — E7800 Pure hypercholesterolemia, unspecified: Secondary | ICD-10-CM | POA: Diagnosis not present

## 2023-06-13 DIAGNOSIS — I447 Left bundle-branch block, unspecified: Secondary | ICD-10-CM | POA: Diagnosis not present

## 2023-06-13 DIAGNOSIS — R7989 Other specified abnormal findings of blood chemistry: Secondary | ICD-10-CM | POA: Diagnosis not present

## 2023-06-13 DIAGNOSIS — I10 Essential (primary) hypertension: Secondary | ICD-10-CM | POA: Diagnosis not present

## 2023-06-13 DIAGNOSIS — I4891 Unspecified atrial fibrillation: Secondary | ICD-10-CM | POA: Diagnosis not present

## 2023-06-13 DIAGNOSIS — T83511A Infection and inflammatory reaction due to indwelling urethral catheter, initial encounter: Secondary | ICD-10-CM | POA: Diagnosis not present

## 2023-06-14 DIAGNOSIS — I447 Left bundle-branch block, unspecified: Secondary | ICD-10-CM | POA: Diagnosis not present

## 2023-06-14 DIAGNOSIS — R7989 Other specified abnormal findings of blood chemistry: Secondary | ICD-10-CM | POA: Diagnosis not present

## 2023-06-14 DIAGNOSIS — I4891 Unspecified atrial fibrillation: Secondary | ICD-10-CM | POA: Diagnosis not present

## 2023-06-14 DIAGNOSIS — T83511A Infection and inflammatory reaction due to indwelling urethral catheter, initial encounter: Secondary | ICD-10-CM | POA: Diagnosis not present

## 2023-06-14 DIAGNOSIS — E7800 Pure hypercholesterolemia, unspecified: Secondary | ICD-10-CM | POA: Diagnosis not present

## 2023-06-14 DIAGNOSIS — E43 Unspecified severe protein-calorie malnutrition: Secondary | ICD-10-CM | POA: Diagnosis not present

## 2023-06-14 DIAGNOSIS — I10 Essential (primary) hypertension: Secondary | ICD-10-CM | POA: Diagnosis not present

## 2023-06-14 DIAGNOSIS — I214 Non-ST elevation (NSTEMI) myocardial infarction: Secondary | ICD-10-CM | POA: Diagnosis not present

## 2023-06-15 DIAGNOSIS — I4891 Unspecified atrial fibrillation: Secondary | ICD-10-CM | POA: Diagnosis not present

## 2023-06-15 DIAGNOSIS — I10 Essential (primary) hypertension: Secondary | ICD-10-CM | POA: Diagnosis not present

## 2023-06-15 DIAGNOSIS — T83511A Infection and inflammatory reaction due to indwelling urethral catheter, initial encounter: Secondary | ICD-10-CM | POA: Diagnosis not present

## 2023-06-15 DIAGNOSIS — E7800 Pure hypercholesterolemia, unspecified: Secondary | ICD-10-CM | POA: Diagnosis not present

## 2023-06-15 DIAGNOSIS — I447 Left bundle-branch block, unspecified: Secondary | ICD-10-CM | POA: Diagnosis not present

## 2023-06-15 DIAGNOSIS — E43 Unspecified severe protein-calorie malnutrition: Secondary | ICD-10-CM | POA: Diagnosis not present

## 2023-06-15 DIAGNOSIS — I214 Non-ST elevation (NSTEMI) myocardial infarction: Secondary | ICD-10-CM | POA: Diagnosis not present

## 2023-06-15 DIAGNOSIS — R7989 Other specified abnormal findings of blood chemistry: Secondary | ICD-10-CM | POA: Diagnosis not present

## 2023-06-16 DIAGNOSIS — I447 Left bundle-branch block, unspecified: Secondary | ICD-10-CM | POA: Diagnosis not present

## 2023-06-16 DIAGNOSIS — E43 Unspecified severe protein-calorie malnutrition: Secondary | ICD-10-CM | POA: Diagnosis not present

## 2023-06-16 DIAGNOSIS — E7800 Pure hypercholesterolemia, unspecified: Secondary | ICD-10-CM | POA: Diagnosis not present

## 2023-06-16 DIAGNOSIS — T83511A Infection and inflammatory reaction due to indwelling urethral catheter, initial encounter: Secondary | ICD-10-CM | POA: Diagnosis not present

## 2023-06-16 DIAGNOSIS — I214 Non-ST elevation (NSTEMI) myocardial infarction: Secondary | ICD-10-CM | POA: Diagnosis not present

## 2023-06-16 DIAGNOSIS — I4891 Unspecified atrial fibrillation: Secondary | ICD-10-CM | POA: Diagnosis not present

## 2023-06-16 DIAGNOSIS — R7989 Other specified abnormal findings of blood chemistry: Secondary | ICD-10-CM | POA: Diagnosis not present

## 2023-06-16 DIAGNOSIS — I10 Essential (primary) hypertension: Secondary | ICD-10-CM | POA: Diagnosis not present

## 2023-06-27 DIAGNOSIS — E7800 Pure hypercholesterolemia, unspecified: Secondary | ICD-10-CM | POA: Diagnosis not present

## 2023-06-27 DIAGNOSIS — E43 Unspecified severe protein-calorie malnutrition: Secondary | ICD-10-CM | POA: Diagnosis not present

## 2023-06-27 DIAGNOSIS — I110 Hypertensive heart disease with heart failure: Secondary | ICD-10-CM | POA: Diagnosis not present

## 2023-06-27 DIAGNOSIS — S32000G Wedge compression fracture of unspecified lumbar vertebra, subsequent encounter for fracture with delayed healing: Secondary | ICD-10-CM | POA: Diagnosis not present

## 2023-06-27 DIAGNOSIS — I499 Cardiac arrhythmia, unspecified: Secondary | ICD-10-CM | POA: Diagnosis not present

## 2023-06-27 DIAGNOSIS — R4182 Altered mental status, unspecified: Secondary | ICD-10-CM | POA: Diagnosis not present

## 2023-06-27 DIAGNOSIS — R410 Disorientation, unspecified: Secondary | ICD-10-CM | POA: Diagnosis not present

## 2023-06-27 DIAGNOSIS — R748 Abnormal levels of other serum enzymes: Secondary | ICD-10-CM | POA: Diagnosis not present

## 2023-06-27 DIAGNOSIS — Z7401 Bed confinement status: Secondary | ICD-10-CM | POA: Diagnosis not present

## 2023-06-27 DIAGNOSIS — I700 Atherosclerosis of aorta: Secondary | ICD-10-CM | POA: Diagnosis not present

## 2023-06-27 DIAGNOSIS — M549 Dorsalgia, unspecified: Secondary | ICD-10-CM | POA: Diagnosis not present

## 2023-06-27 DIAGNOSIS — M4856XA Collapsed vertebra, not elsewhere classified, lumbar region, initial encounter for fracture: Secondary | ICD-10-CM | POA: Diagnosis not present

## 2023-06-27 DIAGNOSIS — I4891 Unspecified atrial fibrillation: Secondary | ICD-10-CM | POA: Diagnosis not present

## 2023-06-27 DIAGNOSIS — R000 Tachycardia, unspecified: Secondary | ICD-10-CM | POA: Diagnosis not present

## 2023-06-27 DIAGNOSIS — T428X5A Adverse effect of antiparkinsonism drugs and other central muscle-tone depressants, initial encounter: Secondary | ICD-10-CM | POA: Diagnosis not present

## 2023-06-27 DIAGNOSIS — I5022 Chronic systolic (congestive) heart failure: Secondary | ICD-10-CM | POA: Diagnosis not present

## 2023-06-27 DIAGNOSIS — I6782 Cerebral ischemia: Secondary | ICD-10-CM | POA: Diagnosis not present

## 2023-06-27 DIAGNOSIS — E1165 Type 2 diabetes mellitus with hyperglycemia: Secondary | ICD-10-CM | POA: Diagnosis not present

## 2023-06-27 DIAGNOSIS — F03C11 Unspecified dementia, severe, with agitation: Secondary | ICD-10-CM | POA: Diagnosis not present

## 2023-06-27 DIAGNOSIS — Z681 Body mass index (BMI) 19 or less, adult: Secondary | ICD-10-CM | POA: Diagnosis not present

## 2023-06-27 DIAGNOSIS — F0390 Unspecified dementia without behavioral disturbance: Secondary | ICD-10-CM | POA: Diagnosis not present

## 2023-06-27 DIAGNOSIS — I10 Essential (primary) hypertension: Secondary | ICD-10-CM | POA: Diagnosis not present

## 2023-06-27 DIAGNOSIS — F05 Delirium due to known physiological condition: Secondary | ICD-10-CM | POA: Diagnosis not present

## 2023-06-27 DIAGNOSIS — I672 Cerebral atherosclerosis: Secondary | ICD-10-CM | POA: Diagnosis not present

## 2023-06-27 DIAGNOSIS — I509 Heart failure, unspecified: Secondary | ICD-10-CM | POA: Diagnosis not present

## 2023-06-27 DIAGNOSIS — R456 Violent behavior: Secondary | ICD-10-CM | POA: Diagnosis not present

## 2023-06-30 ENCOUNTER — Ambulatory Visit: Payer: Medicare HMO | Admitting: Cardiology

## 2023-07-05 DIAGNOSIS — S32000G Wedge compression fracture of unspecified lumbar vertebra, subsequent encounter for fracture with delayed healing: Secondary | ICD-10-CM | POA: Diagnosis not present

## 2023-07-09 DIAGNOSIS — E7800 Pure hypercholesterolemia, unspecified: Secondary | ICD-10-CM | POA: Diagnosis not present

## 2023-07-09 DIAGNOSIS — E43 Unspecified severe protein-calorie malnutrition: Secondary | ICD-10-CM | POA: Diagnosis not present

## 2023-07-09 DIAGNOSIS — I509 Heart failure, unspecified: Secondary | ICD-10-CM | POA: Diagnosis not present

## 2023-07-09 DIAGNOSIS — R739 Hyperglycemia, unspecified: Secondary | ICD-10-CM | POA: Diagnosis not present

## 2023-07-09 DIAGNOSIS — R4182 Altered mental status, unspecified: Secondary | ICD-10-CM | POA: Diagnosis not present

## 2023-07-09 DIAGNOSIS — I110 Hypertensive heart disease with heart failure: Secondary | ICD-10-CM | POA: Diagnosis not present

## 2023-07-09 DIAGNOSIS — I10 Essential (primary) hypertension: Secondary | ICD-10-CM | POA: Diagnosis not present

## 2023-07-09 DIAGNOSIS — K5900 Constipation, unspecified: Secondary | ICD-10-CM | POA: Diagnosis not present

## 2023-07-09 DIAGNOSIS — E871 Hypo-osmolality and hyponatremia: Secondary | ICD-10-CM | POA: Diagnosis not present

## 2023-07-09 DIAGNOSIS — E039 Hypothyroidism, unspecified: Secondary | ICD-10-CM | POA: Diagnosis not present

## 2023-07-09 DIAGNOSIS — F0390 Unspecified dementia without behavioral disturbance: Secondary | ICD-10-CM | POA: Diagnosis not present

## 2023-07-09 DIAGNOSIS — I5022 Chronic systolic (congestive) heart failure: Secondary | ICD-10-CM | POA: Diagnosis not present

## 2023-07-09 DIAGNOSIS — M549 Dorsalgia, unspecified: Secondary | ICD-10-CM | POA: Diagnosis not present

## 2023-07-09 DIAGNOSIS — E860 Dehydration: Secondary | ICD-10-CM | POA: Diagnosis not present

## 2023-07-09 DIAGNOSIS — F03C11 Unspecified dementia, severe, with agitation: Secondary | ICD-10-CM | POA: Diagnosis not present

## 2023-07-09 DIAGNOSIS — E1169 Type 2 diabetes mellitus with other specified complication: Secondary | ICD-10-CM | POA: Diagnosis not present

## 2023-07-09 DIAGNOSIS — R748 Abnormal levels of other serum enzymes: Secondary | ICD-10-CM | POA: Diagnosis not present

## 2023-07-09 DIAGNOSIS — M810 Age-related osteoporosis without current pathological fracture: Secondary | ICD-10-CM | POA: Diagnosis not present

## 2023-07-10 DIAGNOSIS — E43 Unspecified severe protein-calorie malnutrition: Secondary | ICD-10-CM | POA: Diagnosis not present

## 2023-07-10 DIAGNOSIS — I5022 Chronic systolic (congestive) heart failure: Secondary | ICD-10-CM | POA: Diagnosis not present

## 2023-07-10 DIAGNOSIS — K5900 Constipation, unspecified: Secondary | ICD-10-CM | POA: Diagnosis not present

## 2023-07-11 DIAGNOSIS — I5022 Chronic systolic (congestive) heart failure: Secondary | ICD-10-CM | POA: Diagnosis not present

## 2023-07-11 DIAGNOSIS — E43 Unspecified severe protein-calorie malnutrition: Secondary | ICD-10-CM | POA: Diagnosis not present

## 2023-07-11 DIAGNOSIS — K5900 Constipation, unspecified: Secondary | ICD-10-CM | POA: Diagnosis not present

## 2023-07-12 ENCOUNTER — Other Ambulatory Visit: Payer: Self-pay | Admitting: Physician Assistant

## 2023-07-13 ENCOUNTER — Telehealth: Payer: Self-pay | Admitting: Cardiology

## 2023-07-14 ENCOUNTER — Ambulatory Visit: Payer: Medicare HMO | Admitting: Cardiology

## 2023-07-27 NOTE — Telephone Encounter (Signed)
Granddaughter Dulce Sellar) called to report patient passed away last night.

## 2023-07-27 DEATH — deceased
